# Patient Record
Sex: Male | Born: 1990 | Race: White | Hispanic: No | Marital: Single | Smoking: Current every day smoker
Health system: Southern US, Community
[De-identification: ages and names within clinical notes are randomized; demographics above are authoritative.]

## PROBLEM LIST (undated history)

## (undated) DIAGNOSIS — F1020 Alcohol dependence, uncomplicated: Secondary | ICD-10-CM

---
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
## 2016-01-16 ENCOUNTER — Encounter (HOSPITAL_COMMUNITY): Payer: Self-pay | Admitting: Emergency Medicine

## 2016-01-16 ENCOUNTER — Emergency Department (HOSPITAL_COMMUNITY)
Admission: EM | Admit: 2016-01-16 | Discharge: 2016-01-16 | Disposition: A | Discharge status: Home / Self Care | Payer: Self-pay | Attending: Emergency Medicine | Admitting: Emergency Medicine

## 2016-01-16 ENCOUNTER — Emergency Department (HOSPITAL_COMMUNITY): Payer: Self-pay

## 2016-01-16 DIAGNOSIS — F17210 Nicotine dependence, cigarettes, uncomplicated: Secondary | ICD-10-CM | POA: Insufficient documentation

## 2016-01-16 DIAGNOSIS — Z79899 Other long term (current) drug therapy: Secondary | ICD-10-CM | POA: Insufficient documentation

## 2016-01-16 DIAGNOSIS — R079 Chest pain, unspecified: Secondary | ICD-10-CM | POA: Insufficient documentation

## 2016-01-16 DIAGNOSIS — R05 Cough: Secondary | ICD-10-CM | POA: Insufficient documentation

## 2016-01-16 LAB — BASIC METABOLIC PANEL
Anion gap: 11 (ref 5–15)
BUN: 8 mg/dL (ref 6–20)
CHLORIDE: 105 mmol/L (ref 101–111)
CO2: 26 mmol/L (ref 22–32)
Calcium: 8.8 mg/dL — ABNORMAL LOW (ref 8.9–10.3)
Creatinine, Ser: 0.76 mg/dL (ref 0.61–1.24)
GFR calc Af Amer: >60 mL/min (ref >60)
GFR calc non Af Amer: >60 mL/min (ref >60)
GLUCOSE: 89 mg/dL (ref 65–99)
POTASSIUM: 3.3 mmol/L — AB (ref 3.5–5.1)
Sodium: 142 mmol/L (ref 135–145)

## 2016-01-16 LAB — CBC
HEMATOCRIT: 44.4 % (ref 39.0–52.0)
Hemoglobin: 15.3 g/dL (ref 13.0–17.0)
MCH: 33.4 pg (ref 26.0–34.0)
MCHC: 34.5 g/dL (ref 30.0–36.0)
MCV: 96.9 fL (ref 78.0–100.0)
Platelets: 264 10*3/uL (ref 150–400)
RBC: 4.58 MIL/uL (ref 4.22–5.81)
RDW: 12.9 % (ref 11.5–15.5)
WBC: 7.4 10*3/uL (ref 4.0–10.5)

## 2016-01-16 LAB — TROPONIN I: Troponin I: <0.03 ng/mL (ref <0.031)

## 2016-01-16 MED ORDER — IBUPROFEN 600 MG PO TABS: 600.0000 mg | ORAL_TABLET | Freq: Four times a day (QID) | ORAL | Status: DC | PRN

## 2016-01-16 MED ORDER — IBUPROFEN 800 MG PO TABS
800.0000 mg | ORAL_TABLET | Freq: Once | ORAL | Status: AC
  Administered 2016-01-16: 800 mg via ORAL
  Filled 2016-01-16: qty 1

## 2016-01-16 NOTE — ED Notes (Signed)
PT c/o left sided rib pain x4 days and states recent congestion with cough. PT also states dizziness at times x3 days. PT states productive brown sputum cough.

## 2016-01-16 NOTE — ED Provider Notes (Signed)
CSN: [id number]     Arrival date & time 01/16/16  1114 History   First MD Initiated Contact with Patient 01/16/16 1330     Chief Complaint  Patient presents with  . Chest Pain    Rib Pain     Patient is a 25 y.o. male presenting with chest pain. The history is provided by the patient.  Chest Pain Pain location:  L chest Pain quality comment:  Soreness Pain radiates to:  Does not radiate Pain severity:  Moderate Onset quality:  Gradual Duration:  2 days Timing:  Constant Progression:  Worsening Chronicity:  New Relieved by:  Nothing Worsened by:  Coughing, movement, deep breathing and certain positions Associated symptoms: cough   Associated symptoms: no abdominal pain, no fever, no lower extremity edema, no shortness of breath, no syncope and not vomiting   Associated symptoms comment:  Denies hemoptysis  Risk factors: male sex and smoking   Risk factors: no coronary artery disease, no diabetes mellitus, no immobilization, no prior DVT/PE and no surgery   pt reports left sided rib pain for 2 days It is worse with movement/palpation and deep breathing No trauma but does report heavy lifting at work No h/o CAD/PE/DVT He is a smoker No recent travel     PMH - none Soc hx - smoker Family history - positive for CAD Social History  Substance Use Topics  . Smoking status: Current Every Day Smoker -- 2.00 packs/day    Types: Cigarettes  . Smokeless tobacco: None  . Alcohol Use: Yes     Comment: occasionally    Review of Systems  Constitutional: Negative for fever.  Respiratory: Positive for cough. Negative for shortness of breath.   Cardiovascular: Positive for chest pain. Negative for leg swelling and syncope.  Gastrointestinal: Negative for vomiting and abdominal pain.  Neurological: Negative for syncope.  Psychiatric/Behavioral: The patient is nervous/anxious.   All other systems reviewed and are negative.     Allergies  Review of patient's allergies  indicates no known allergies.  Home Medications   Prior to Admission medications   Medication Sig Start Date End Date Taking? Authorizing Provider  traZODone (DESYREL) 100 MG tablet Take 100 mg by mouth at bedtime as needed for sleep.   Yes Historical Provider, MD  ibuprofen (ADVIL,MOTRIN) 600 MG tablet Take 1 tablet (600 mg total) by mouth every 6 (six) hours as needed. 01/16/16   Zadie Rhineonald Shoji Pertuit, MD   BP 141/71 mmHg  Pulse 119  Temp(Src) 98.9 F (37.2 C) (Oral)  Resp 16  Ht 5\' 7"  (1.702 m)  Wt 72.576 kg  BMI 25.05 kg/m2  SpO2 96% Physical Exam CONSTITUTIONAL: Well developed/well nourished HEAD: Normocephalic/atraumatic EYES: EOMI/PERRL ENMT: Mucous membranes moist NECK: supple no meningeal signs SPINE/BACK:entire spine nontender CV: S1/S2 noted, no murmurs/rubs/gallops noted Chest - diffuse tenderness along left chest wall, no bruising/crepitus.   LUNGS: Lungs are clear to auscultation bilaterally, no apparent distress ABDOMEN: soft, nontender, no rebound or guarding, bowel sounds noted throughout abdomen GU:no cva tenderness NEURO: Pt is awake/alert/appropriate, moves all extremitiesx4.  No facial droop.   EXTREMITIES: pulses normal/equal, full ROM, no LE edema noted SKIN: warm, color normal PSYCH: he is anxious  ED Course  Procedures  Medications  ibuprofen (ADVIL,MOTRIN) tablet 800 mg (800 mg Oral Given 01/16/16 1421)    Labs Review Labs Reviewed  BASIC METABOLIC PANEL - Abnormal; Notable for the following:    Potassium 3.3 (*)    Calcium 8.8 (*)    All other  components within normal limits  CBC  TROPONIN I    Imaging Review Dg Chest 2 View  01/16/2016  CLINICAL DATA:  Chest pain on off for years EXAM: CHEST  2 VIEW COMPARISON:  None. FINDINGS: The heart size and mediastinal contours are within normal limits. Both lungs are clear. The visualized skeletal structures are unremarkable. IMPRESSION: No active cardiopulmonary disease. Electronically Signed   By:  Elige Ko   On: 01/16/2016 12:10   I have personally reviewed and evaluated these images and lab results as part of my medical decision-making.   EKG Interpretation   Date/Time:  Monday January 16 2016 11:37:16 EDT Ventricular Rate:  115 PR Interval:  158 QRS Duration: 84 QT Interval:  318 QTC Calculation: 439 R Axis:   88 Text Interpretation:  Sinus tachycardia Otherwise normal ECG No previous  ECGs available Confirmed by Bebe Shaggy  MD, Dorinda Hill ([id number]) on 01/16/2016  12:57:37 PM     Pt appears very anxious Suspect tachycardia due to anxiety His CP is reproducible with palpation and movement in the bed I doubt ACS/PE/Dissection at this time He appears improved after I discussed normal labs/imaging Discussed return precautions  MDM   Final diagnoses:  Chest pain, unspecified chest pain type    Nursing notes including past medical history and social history reviewed and considered in documentation xrays/imaging reviewed by myself and considered during evaluation Labs/vital reviewed myself and considered during evaluation     Zadie Rhine, MD 01/16/16 1504

## 2016-01-16 NOTE — Discharge Instructions (Signed)

## 2016-09-17 ENCOUNTER — Emergency Department (HOSPITAL_COMMUNITY)
Admission: EM | Admit: 2016-09-17 | Discharge: 2016-09-17 | Disposition: A | Discharge status: Home / Self Care | Payer: Self-pay | Attending: Emergency Medicine | Admitting: Emergency Medicine

## 2016-09-17 ENCOUNTER — Emergency Department (HOSPITAL_COMMUNITY): Payer: Self-pay

## 2016-09-17 ENCOUNTER — Encounter (HOSPITAL_COMMUNITY): Payer: Self-pay | Admitting: Emergency Medicine

## 2016-09-17 DIAGNOSIS — F17210 Nicotine dependence, cigarettes, uncomplicated: Secondary | ICD-10-CM | POA: Insufficient documentation

## 2016-09-17 DIAGNOSIS — J069 Acute upper respiratory infection, unspecified: Secondary | ICD-10-CM | POA: Insufficient documentation

## 2016-09-17 MED ORDER — PREDNISONE 20 MG PO TABS: 40.0000 mg | ORAL_TABLET | Freq: Every day | ORAL | 0 refills | Status: DC

## 2016-09-17 MED ORDER — IPRATROPIUM-ALBUTEROL 0.5-2.5 (3) MG/3ML IN SOLN
3.0000 mL | Freq: Once | RESPIRATORY_TRACT | Status: AC
  Administered 2016-09-17: 3 mL via RESPIRATORY_TRACT
  Filled 2016-09-17: qty 3

## 2016-09-17 MED ORDER — ALBUTEROL SULFATE HFA 108 (90 BASE) MCG/ACT IN AERS
2.0000 | INHALATION_SPRAY | Freq: Once | RESPIRATORY_TRACT | Status: AC
  Administered 2016-09-17: 2 via RESPIRATORY_TRACT
  Filled 2016-09-17: qty 6.7

## 2016-09-17 MED ORDER — BENZONATATE 100 MG PO CAPS: 200.0000 mg | ORAL_CAPSULE | Freq: Three times a day (TID) | ORAL | 0 refills | Status: DC | PRN

## 2016-09-17 MED ORDER — ALBUTEROL SULFATE (2.5 MG/3ML) 0.083% IN NEBU
2.5000 mg | INHALATION_SOLUTION | Freq: Once | RESPIRATORY_TRACT | Status: AC
  Administered 2016-09-17: 2.5 mg via RESPIRATORY_TRACT
  Filled 2016-09-17: qty 3

## 2016-09-17 MED ORDER — PREDNISONE 50 MG PO TABS
60.0000 mg | ORAL_TABLET | Freq: Once | ORAL | Status: AC
  Administered 2016-09-17: 60 mg via ORAL
  Filled 2016-09-17: qty 1

## 2016-09-17 NOTE — ED Provider Notes (Signed)
[facility]-EMERGENCY DEPT Provider Note   CSN: [id number] Arrival date & time: 09/17/16  [id number]     History   Chief Complaint Chief Complaint  Patient presents with  . Generalized Body Aches  . Cough    HPI Riley SermonRobert G Eaker is a 25 y.o. male.  HPI   Riley [name] is a 25 y.o. male who presents to the Emergency Department complaining of cough, wheezing, sore throat and nasal congestion.  Onset of symptoms began 3 days ago.  He describes a non-productive cough with associated chest tightness.  He denies shortness of breath, fever, abd pain, vomiting or known sick contacts.  He has been taking an OTC cold medication without relief.     History reviewed. No pertinent past medical history.  There are no active problems to display for this patient.   History reviewed. No pertinent surgical history.     Home Medications    Prior to Admission medications   Medication Sig Start Date End Date Taking? Authorizing Provider  acetaminophen (TYLENOL) 500 MG tablet Take 1,000 mg by mouth every 6 (six) hours as needed.   Yes Historical Provider, MD  Pseudoeph-Doxylamine-DM-APAP (NYQUIL PO) Take 30 mLs by mouth daily as needed (cold symptoms).   Yes Historical Provider, MD  traZODone (DESYREL) 100 MG tablet Take 100 mg by mouth at bedtime as needed for sleep.   Yes Historical Provider, MD    Family History History reviewed. No pertinent family history.  Social History Social History  Substance Use Topics  . Smoking status: Current Every Day Smoker    Packs/day: 2.00    Types: Cigarettes  . Smokeless tobacco: Never Used  . Alcohol use Yes     Comment: occasionally     Allergies   Patient has no known allergies.   Review of Systems Review of Systems  Constitutional: Negative for activity change, appetite change, chills and fever.  HENT: Positive for congestion, rhinorrhea and sore throat. Negative for facial swelling and trouble swallowing.   Eyes: Negative for visual  disturbance.  Respiratory: Positive for cough, chest tightness and wheezing. Negative for shortness of breath and stridor.   Gastrointestinal: Negative for abdominal pain, nausea and vomiting.  Genitourinary: Negative for dysuria and flank pain.  Musculoskeletal: Negative for neck pain and neck stiffness.  Skin: Negative.   Neurological: Positive for headaches. Negative for dizziness, weakness and numbness.  Hematological: Negative for adenopathy.  Psychiatric/Behavioral: Negative for confusion.  All other systems reviewed and are negative.    Physical Exam Updated Vital Signs BP 150/100 Comment: Repeat  Pulse 113   Temp 98.2 F (36.8 C) (Oral)   Resp 20   Ht 5\' 7"  (1.702 m)   Wt 72.6 kg   SpO2 100%   BMI 25.06 kg/m   Physical Exam  Constitutional: He is oriented to person, place, and time. He appears well-developed and well-nourished. No distress.  HENT:  Head: Normocephalic and atraumatic.  Right Ear: Tympanic membrane and ear canal normal.  Left Ear: Tympanic membrane and ear canal normal.  Nose: Mucosal edema present.  Mouth/Throat: Uvula is midline and mucous membranes are normal. No uvula swelling. Posterior oropharyngeal erythema present. No oropharyngeal exudate, posterior oropharyngeal edema or tonsillar abscesses.  Eyes: EOM are normal. Pupils are equal, round, and reactive to light.  Neck: Normal range of motion, full passive range of motion without pain and phonation normal. Neck supple. No Kernig's sign noted.  Cardiovascular: Normal rate, regular rhythm and normal heart sounds.   No murmur  heard. Pulmonary/Chest: Effort normal. No stridor. No respiratory distress. He has wheezes. He has no rales. He exhibits no tenderness.  Coarse lungs sounds bilaterally with inspiratory wheezing bilaterally.  No distress noted.   Abdominal: Soft. He exhibits no distension. There is no tenderness.  Musculoskeletal: Normal range of motion. He exhibits no edema.    Lymphadenopathy:    He has no cervical adenopathy.  Neurological: He is alert and oriented to person, place, and time. He exhibits normal muscle tone. Coordination normal.  Skin: Skin is warm and dry. No rash noted.  Nursing note and vitals reviewed.    ED Treatments / Results  Labs (all labs ordered are listed, but only abnormal results are displayed) Labs Reviewed - No data to display  EKG  EKG Interpretation None       Radiology Dg Chest 2 View  Result Date: 09/17/2016 CLINICAL DATA:  Chest pain, shortness of breath, and cough for the past 2-3 days. Current smoker of 1 pack per day. EXAM: CHEST  2 VIEW COMPARISON:  Chest x-ray of January 16, 2016 FINDINGS: The lungs are adequately inflated and clear. The heart and pulmonary vascularity are normal. The mediastinum is normal in width. There is no pleural effusion. The bony thorax is unremarkable. IMPRESSION: There is no active cardiopulmonary disease. Electronically Signed   By: David  SwazilandJordan M.D.   On: 09/17/2016 09:34    Procedures Procedures (including critical care time)  Medications Ordered in ED Medications  predniSONE (DELTASONE) tablet 60 mg (not administered)  ipratropium-albuterol (DUONEB) 0.5-2.5 (3) MG/3ML nebulizer solution 3 mL (3 mLs Nebulization Given 09/17/16 1039)  albuterol (PROVENTIL) (2.5 MG/3ML) 0.083% nebulizer solution 2.5 mg (2.5 mg Nebulization Given 09/17/16 1039)  albuterol (PROVENTIL HFA;VENTOLIN HFA) 108 (90 Base) MCG/ACT inhaler 2 puff (2 puffs Inhalation Given 09/17/16 1048)     Initial Impression / Assessment and Plan / ED Course  I have reviewed the triage vital signs and the nursing notes.  Pertinent labs & imaging results that were available during my care of the patient were reviewed by me and considered in my medical decision making (see chart for details).  Clinical Course    Pt non-toxic appearing.  Mucous membranes are moist.    Lung sounds improved on recheck.  Vitals stable,  tachycardia likely result of taking OTC cold medication.  Sx's likely viral.  Albuterol inhaler dispensed for home use.  Rx for prednisone, tessalon perles for cough  Final Clinical Impressions(s) / ED Diagnoses   Final diagnoses:  Upper respiratory tract infection, unspecified type    New Prescriptions New Prescriptions   No medications on file     Pauline Ausammy Joey Lierman, PA-C 09/17/16 1657    Blane OharaJoshua Zavitz, MD 09/18/16 2200

## 2016-09-17 NOTE — Discharge Instructions (Signed)
Ibuprofen every 6 hrs if needed for fever or body aches,. Drink plenty of fluids.  2 puffs of the inhaler 4 times a day as needed.  Return for any worsening symptoms

## 2016-09-17 NOTE — ED Triage Notes (Signed)
Patient complaining of sore throat, chest with deep breathing and cough, headache, and cough x 3 days.

## 2016-10-24 ENCOUNTER — Emergency Department (HOSPITAL_COMMUNITY)
Admission: EM | Admit: 2016-10-24 | Discharge: 2016-10-25 | Disposition: A | Discharge status: Home / Self Care | Payer: Self-pay | Attending: Emergency Medicine | Admitting: Emergency Medicine

## 2016-10-24 ENCOUNTER — Encounter (HOSPITAL_COMMUNITY): Payer: Self-pay | Admitting: Emergency Medicine

## 2016-10-24 DIAGNOSIS — F10120 Alcohol abuse with intoxication, uncomplicated: Secondary | ICD-10-CM | POA: Insufficient documentation

## 2016-10-24 DIAGNOSIS — Z79899 Other long term (current) drug therapy: Secondary | ICD-10-CM | POA: Insufficient documentation

## 2016-10-24 DIAGNOSIS — F17210 Nicotine dependence, cigarettes, uncomplicated: Secondary | ICD-10-CM | POA: Insufficient documentation

## 2016-10-24 DIAGNOSIS — F10920 Alcohol use, unspecified with intoxication, uncomplicated: Secondary | ICD-10-CM

## 2016-10-24 LAB — CBC WITH DIFFERENTIAL/PLATELET
BASOS PCT: 1 %
Basophils Absolute: 0.1 10*3/uL (ref 0.0–0.1)
EOS ABS: 0.3 10*3/uL (ref 0.0–0.7)
EOS PCT: 3 %
HCT: 45.9 % (ref 39.0–52.0)
HEMOGLOBIN: 15.9 g/dL (ref 13.0–17.0)
Lymphocytes Relative: 45 %
Lymphs Abs: 4.2 10*3/uL — ABNORMAL HIGH (ref 0.7–4.0)
MCH: 34 pg (ref 26.0–34.0)
MCHC: 34.6 g/dL (ref 30.0–36.0)
MCV: 98.1 fL (ref 78.0–100.0)
Monocytes Absolute: 0.4 10*3/uL (ref 0.1–1.0)
Monocytes Relative: 4 %
NEUTROS PCT: 47 %
Neutro Abs: 4.5 10*3/uL (ref 1.7–7.7)
PLATELETS: 326 10*3/uL (ref 150–400)
RBC: 4.68 MIL/uL (ref 4.22–5.81)
RDW: 12.6 % (ref 11.5–15.5)
WBC: 9.4 10*3/uL (ref 4.0–10.5)

## 2016-10-24 LAB — SALICYLATE LEVEL

## 2016-10-24 LAB — COMPREHENSIVE METABOLIC PANEL
ALBUMIN: 4.2 g/dL (ref 3.5–5.0)
ALK PHOS: 73 U/L (ref 38–126)
ALT: 32 U/L (ref 17–63)
ANION GAP: 11 (ref 5–15)
AST: 28 U/L (ref 15–41)
BUN: 5 mg/dL — ABNORMAL LOW (ref 6–20)
CALCIUM: 8.6 mg/dL — AB (ref 8.9–10.3)
CHLORIDE: 111 mmol/L (ref 101–111)
CO2: 23 mmol/L (ref 22–32)
Creatinine, Ser: 0.82 mg/dL (ref 0.61–1.24)
GFR calc non Af Amer: >60 mL/min (ref >60)
GLUCOSE: 104 mg/dL — AB (ref 65–99)
Potassium: 3 mmol/L — ABNORMAL LOW (ref 3.5–5.1)
SODIUM: 145 mmol/L (ref 135–145)
Total Bilirubin: 0.4 mg/dL (ref 0.3–1.2)
Total Protein: 7.2 g/dL (ref 6.5–8.1)

## 2016-10-24 LAB — ACETAMINOPHEN LEVEL

## 2016-10-24 LAB — RAPID URINE DRUG SCREEN, HOSP PERFORMED
Amphetamines: NOT DETECTED
BARBITURATES: NOT DETECTED
BENZODIAZEPINES: NOT DETECTED
COCAINE: NOT DETECTED
OPIATES: NOT DETECTED
Tetrahydrocannabinol: NOT DETECTED

## 2016-10-24 LAB — ETHANOL: Alcohol, Ethyl (B): 361 mg/dL (ref <5)

## 2016-10-24 LAB — CBG MONITORING, ED: Glucose-Capillary: 102 mg/dL — ABNORMAL HIGH (ref 65–99)

## 2016-10-24 MED ORDER — HYDROXYZINE HCL 10 MG PO TABS
10.0000 mg | ORAL_TABLET | Freq: Once | ORAL | Status: AC
  Administered 2016-10-24: 10 mg via ORAL
  Filled 2016-10-24: qty 1

## 2016-10-24 MED ORDER — POTASSIUM CHLORIDE CRYS ER 20 MEQ PO TBCR
40.0000 meq | EXTENDED_RELEASE_TABLET | Freq: Once | ORAL | Status: AC
  Administered 2016-10-24: 40 meq via ORAL
  Filled 2016-10-24: qty 2

## 2016-10-24 NOTE — ED Notes (Signed)
EDP and resident notified of ETOH level

## 2016-10-24 NOTE — ED Notes (Signed)
Pt c/o itching all over.  He removed cardiac monitor.  Per Nurse first pt's girlfriend went upstairs to visit someone in ICU.  Waiting for her to return.  EDP notified of pt wanting to leave and at bedside.  Will order medication for itching.

## 2016-10-24 NOTE — ED Triage Notes (Signed)
Was upstairs visiting friend/family member. Came into ED and nurse first noticed pt was altered, eyes rolling back in head. Slurred speech. Pt admits to ETOH. Denies drug use.

## 2016-10-24 NOTE — ED Provider Notes (Signed)
[facility]-EMERGENCY DEPT Provider Note   CSN: [id number] Arrival date & time: 10/24/16  2042     History   Chief Complaint Chief Complaint  Patient presents with  . Altered Mental Status    HPI Riley SermonRobert G [name] is a 26 y.o. male.  The history is provided by the patient. The history is limited by the condition of the patient.    This patient presents today with altered mental status. He was here with another family member when staff here noted that the patient seemed to be slumped over in his chair. When discussing with the family member and the patient they admit that the patient has been drinking liquor and other alcohol today. Patient denies any illicit drug use. States he has only been drinking alcohol. Does not give a clear quantity of alcohol that he drank today. History is limited due to his level of intoxication today. History reviewed. No pertinent past medical history.  There are no active problems to display for this patient.   History reviewed. No pertinent surgical history.     Home Medications    Prior to Admission medications   Medication Sig Start Date End Date Taking? Authorizing Provider  acetaminophen (TYLENOL) 500 MG tablet Take 1,000 mg by mouth every 6 (six) hours as needed.    Historical Provider, MD  benzonatate (TESSALON PERLES) 100 MG capsule Take 2 capsules (200 mg total) by mouth 3 (three) times daily as needed for cough. Swallow whole, do not chew 09/17/16   Tammy Triplett, PA-C  predniSONE (DELTASONE) 20 MG tablet Take 2 tablets (40 mg total) by mouth daily. For 5 days 09/17/16   Tammy Triplett, PA-C  Pseudoeph-Doxylamine-DM-APAP (NYQUIL PO) Take 30 mLs by mouth daily as needed (cold symptoms).    Historical Provider, MD  traZODone (DESYREL) 100 MG tablet Take 100 mg by mouth at bedtime as needed for sleep.    Historical Provider, MD    Family History No family history on file.  Social History Social History  Substance Use Topics  . Smoking status:  Current Every Day Smoker    Packs/day: 2.00    Types: Cigarettes  . Smokeless tobacco: Never Used  . Alcohol use Yes     Comment: occasionally     Allergies   Patient has no known allergies.   Review of Systems Review of Systems  Unable to perform ROS: Mental status change     Physical Exam Updated Vital Signs BP 120/89   Pulse (!) 27   Temp 98.4 F (36.9 C) (Oral)   Resp 25   Ht 5\' 9"  (1.753 m)   Wt 72.6 kg   SpO2 (!) 80%   BMI 23.63 kg/m   Physical Exam  Constitutional: No distress.  HENT:  Head: Normocephalic and atraumatic.  Eyes: Conjunctivae and EOM are normal. Pupils are equal, round, and reactive to light.  Horizontal nystagmus noted on my exam  Neck: Normal range of motion. Neck supple.  Cardiovascular: Normal rate and regular rhythm.   Pulmonary/Chest: Effort normal and breath sounds normal. No respiratory distress.  Abdominal: Soft. He exhibits no distension. There is no tenderness.  Neurological: He is alert.  Skin: Skin is warm and dry. He is not diaphoretic.  Psychiatric: He is agitated.  Nursing note and vitals reviewed.    ED Treatments / Results  Labs (all labs ordered are listed, but only abnormal results are displayed) Labs Reviewed  CBC WITH DIFFERENTIAL/PLATELET - Abnormal; Notable for the following:  Result Value   Lymphs Abs 4.2 (*)    All other components within normal limits  COMPREHENSIVE METABOLIC PANEL - Abnormal; Notable for the following:    Potassium 3.0 (*)    Glucose, Bld 104 (*)    BUN 5 (*)    Calcium 8.6 (*)    All other components within normal limits  ETHANOL - Abnormal; Notable for the following:    Alcohol, Ethyl (B) 361 (*)    All other components within normal limits  ACETAMINOPHEN LEVEL - Abnormal; Notable for the following:    Acetaminophen (Tylenol), Serum <10 (*)    All other components within normal limits  CBG MONITORING, ED - Abnormal; Notable for the following:    Glucose-Capillary 102 (*)     All other components within normal limits  SALICYLATE LEVEL  RAPID URINE DRUG SCREEN, HOSP PERFORMED    EKG  EKG Interpretation  Date/Time:  Wednesday October 24 2016 20:54:44 EST Ventricular Rate:  102 PR Interval:    QRS Duration: 91 QT Interval:  331 QTC Calculation: 432 R Axis:   89 Text Interpretation:  Sinus tachycardia similar to prior EKG  Confirmed by LIU MD, DANA ([id number]) on 10/24/2016 9:01:44 PM       Radiology No results found.  Procedures Procedures (including critical care time)  Medications Ordered in ED Medications  hydrOXYzine (ATARAX/VISTARIL) tablet 10 mg (not administered)  potassium chloride SA (K-DUR,KLOR-CON) CR tablet 40 mEq (40 mEq Oral Given 10/24/16 2242)     Initial Impression / Assessment and Plan / ED Course  I have reviewed the triage vital signs and the nursing notes.  Pertinent labs & imaging results that were available during my care of the patient were reviewed by me and considered in my medical decision making (see chart for details).  Clinical Course     Patient is a 26 year old male who presents today with altered mental status. Patient admits to drinking but denies any other illicit drug use. Tylenol and salicylate levels are negative. His alcohol level is notably 361 today. This is congruent with the patient's observe behaviors here in the emergency department. Otherwise, mild hypokalemia is noted with potassium being 3.0. We'll replete with oral potassium. Urine drug screen is negative.  Plan at this time is to metabolize to freedom. We will continue to observe the patient here in the emergency Department until clinically sober.  Patient's girlfriend shows up later. Patient up walking around in the room with no difficulty. Will discharge patient from ED once she arrives and can clarify she will be with him while he is intoxicated.   Care transferred to oncoming attending while awaiting arrival of girlfriend.    Final Clinical  Impressions(s) / ED Diagnoses   Final diagnoses:  Alcoholic intoxication without complication [facility])    New Prescriptions New Prescriptions   No medications on file     Madolyn Frieze, MD 10/24/16 2248    Madolyn Frieze, MD 10/24/16 2349    Lavera Guise, MD 10/25/16 0002    Lavera Guise, MD 10/25/16 0020

## 2016-10-25 NOTE — ED Notes (Signed)
Pt in another hallway dressed and trying to leave.  States he is going upstairs to look for his sister.  Informed him he would have to wait in ED until someone comes to get him.  NS called unit that his family member is on and asked them to send visitor to ED to get pt.  Dr. Verdie MosherLiu states pt can leave once someone comes to get him.

## 2017-04-22 ENCOUNTER — Emergency Department (HOSPITAL_COMMUNITY)
Admission: EM | Admit: 2017-04-22 | Discharge: 2017-04-23 | Disposition: A | Discharge status: Home / Self Care | Payer: Self-pay | Attending: Emergency Medicine | Admitting: Emergency Medicine

## 2017-04-22 ENCOUNTER — Encounter (HOSPITAL_COMMUNITY): Payer: Self-pay | Admitting: Emergency Medicine

## 2017-04-22 DIAGNOSIS — F17210 Nicotine dependence, cigarettes, uncomplicated: Secondary | ICD-10-CM | POA: Insufficient documentation

## 2017-04-22 DIAGNOSIS — F1010 Alcohol abuse, uncomplicated: Secondary | ICD-10-CM | POA: Insufficient documentation

## 2017-04-22 HISTORY — DX: Alcohol dependence, uncomplicated: F10.20

## 2017-04-22 LAB — RAPID URINE DRUG SCREEN, HOSP PERFORMED
AMPHETAMINES: NOT DETECTED
BENZODIAZEPINES: NOT DETECTED
Barbiturates: NOT DETECTED
COCAINE: NOT DETECTED
OPIATES: NOT DETECTED
TETRAHYDROCANNABINOL: POSITIVE — AB

## 2017-04-22 LAB — COMPREHENSIVE METABOLIC PANEL
ALBUMIN: 4.1 g/dL (ref 3.5–5.0)
ALK PHOS: 110 U/L (ref 38–126)
ALT: 21 U/L (ref 17–63)
AST: 28 U/L (ref 15–41)
Anion gap: 18 — ABNORMAL HIGH (ref 5–15)
BUN: 9 mg/dL (ref 6–20)
CO2: 23 mmol/L (ref 22–32)
Calcium: 8.7 mg/dL — ABNORMAL LOW (ref 8.9–10.3)
Chloride: 100 mmol/L — ABNORMAL LOW (ref 101–111)
Creatinine, Ser: 0.81 mg/dL (ref 0.61–1.24)
GFR calc non Af Amer: >60 mL/min (ref >60)
GLUCOSE: 83 mg/dL (ref 65–99)
Potassium: 3 mmol/L — ABNORMAL LOW (ref 3.5–5.1)
SODIUM: 141 mmol/L (ref 135–145)
TOTAL PROTEIN: 7.9 g/dL (ref 6.5–8.1)
Total Bilirubin: 0.9 mg/dL (ref 0.3–1.2)

## 2017-04-22 LAB — CBC WITH DIFFERENTIAL/PLATELET
Basophils Absolute: 0.1 10*3/uL (ref 0.0–0.1)
Basophils Relative: 1 %
EOS ABS: 0.2 10*3/uL (ref 0.0–0.7)
Eosinophils Relative: 1 %
HCT: 45.8 % (ref 39.0–52.0)
HEMOGLOBIN: 16.1 g/dL (ref 13.0–17.0)
LYMPHS ABS: 2.9 10*3/uL (ref 0.7–4.0)
Lymphocytes Relative: 22 %
MCH: 34.3 pg — AB (ref 26.0–34.0)
MCHC: 35.2 g/dL (ref 30.0–36.0)
MCV: 97.7 fL (ref 78.0–100.0)
Monocytes Absolute: 0.4 10*3/uL (ref 0.1–1.0)
Monocytes Relative: 3 %
NEUTROS PCT: 73 %
Neutro Abs: 9.5 10*3/uL — ABNORMAL HIGH (ref 1.7–7.7)
Platelets: 297 10*3/uL (ref 150–400)
RBC: 4.69 MIL/uL (ref 4.22–5.81)
RDW: 12.5 % (ref 11.5–15.5)
WBC: 13.2 10*3/uL — AB (ref 4.0–10.5)

## 2017-04-22 LAB — ETHANOL: Alcohol, Ethyl (B): 371 mg/dL (ref <5)

## 2017-04-22 MED ORDER — IBUPROFEN 400 MG PO TABS
400.0000 mg | ORAL_TABLET | Freq: Once | ORAL | Status: AC
  Administered 2017-04-22: 400 mg via ORAL
  Filled 2017-04-22: qty 1

## 2017-04-22 MED ORDER — POTASSIUM CHLORIDE CRYS ER 20 MEQ PO TBCR
40.0000 meq | EXTENDED_RELEASE_TABLET | Freq: Once | ORAL | Status: AC
  Administered 2017-04-22: 40 meq via ORAL
  Filled 2017-04-22: qty 2

## 2017-04-22 MED ORDER — SILVER SULFADIAZINE 1 % EX CREA
TOPICAL_CREAM | Freq: Once | CUTANEOUS | Status: AC
  Administered 2017-04-22: 23:00:00 via TOPICAL
  Filled 2017-04-22: qty 50

## 2017-04-22 NOTE — ED Notes (Signed)
CRITICAL VALUE ALERT  Critical Value:  Alcohol 371  Date & Time Notied:  04/22/17  Provider Notified: Dr. Hyacinth MeekerMiller  Orders Received/Actions taken:

## 2017-04-22 NOTE — ED Notes (Signed)
Probation office Riley [name](Jason Gibson) states that he spoke with the pt this am and told him to arrive at his office at 4 pm for known parole warrants today. [phone]. PT has ankle bractlet intact to right ankle d/t registered sex offender.

## 2017-04-22 NOTE — ED Notes (Signed)
edp aware of [facility]WA

## 2017-04-22 NOTE — ED Notes (Signed)
Patient was unable to walk almost past out .was short of breath

## 2017-04-22 NOTE — ED Notes (Signed)
Pt ambulatory to bathroom and back to room assisted by Areta HaberVelinda, NT, sitter- pt continues to report being "hot and dizzy" when ambulating.Dr Clayborne DanaMesner made aware.

## 2017-04-22 NOTE — ED Provider Notes (Signed)
10:56 PM Assumed care from Dr. Hyacinth MeekerMiller, please see their note for full history, physical and decision making until this point. In brief this is a 26 y.o. year old male who presented to the ED tonight with Medical Clearance     Assumed care pending clinical sobriety.   Reevaluated pateint multiple times in my shift and was always arousable but towards the end of the shift he was more arousable, awake, alert, oriented. States he is not suicidal or homicidal. No e/o psychosis. Does want to quit drinking and willing to follow up in community for same. Plan for librium taper to try and help with physical effects. Will ambulate. Will provide wound care. otherwise will be stable for discharge.   On attempt of ambulation, slightly light headed and still not feeling baseline. Suspect continued intoxication. Still alert and oriented. Doubt significant cause for symptoms, likely needs further time to sober up. Will have recheck in AM.    Labs, studies and imaging reviewed by myself and considered in medical decision making if ordered. Imaging interpreted by radiology.  Labs Reviewed  CBC WITH DIFFERENTIAL/PLATELET - Abnormal; Notable for the following:       Result Value   WBC 13.2 (*)    MCH 34.3 (*)    Neutro Abs 9.5 (*)    All other components within normal limits  COMPREHENSIVE METABOLIC PANEL - Abnormal; Notable for the following:    Potassium 3.0 (*)    Chloride 100 (*)    Calcium 8.7 (*)    Anion gap 18 (*)    All other components within normal limits  RAPID URINE DRUG SCREEN, HOSP PERFORMED - Abnormal; Notable for the following:    Tetrahydrocannabinol POSITIVE (*)    All other components within normal limits  ETHANOL - Abnormal; Notable for the following:    Alcohol, Ethyl (B) 371 (*)    All other components within normal limits    No orders to display    No Follow-up on file.    Marily MemosMesner, Rider Ermis, MD 04/22/17 [id number]

## 2017-04-22 NOTE — ED Notes (Signed)
Pt alert and oriented x 4.  Pt able to tell me where he is at, including name of city he is in currently and name of hospital. Pt given water.
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
## 2017-04-22 NOTE — ED Notes (Signed)
Silvadene cream applied to 1st and 2nd digits of left hand and above umbilicus, covered with adhesive dressing.

## 2017-04-22 NOTE — ED Triage Notes (Signed)
Per ems. Pt states he is a severe alcoholic and wants help. Pt blew .32 on ems breathalyzer.  cbg 85.

## 2017-04-23 LAB — ETHANOL
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
## 2017-04-23 NOTE — Progress Notes (Signed)
CSW spoke with [name] Maduroobert at [facility] who states the pt has been accepted by Dr. [name] [name], MD. Call to report [phone]. JJ, RN notified of the pt's acceptance.   Riley [name], MSW, LCSWA CSW Disposition [phone]

## 2017-04-23 NOTE — ED Notes (Signed)
Pt eating breakfast tray 

## 2017-04-23 NOTE — ED Notes (Signed)
Pt given cup of water 

## 2017-04-23 NOTE — ED Notes (Signed)
This nurse questioned pt why he came to ED- pt said he "called EMS because he could not function". This nurse asked pt if he had any thoughts of hurting himself or anyone else. Pt stated, "no". Dr Clayborne DanaMesner made aware and says pt does not need sitter for SI as pt had not reported any thoughts of suicide. Wilkie AyeKristy, RN charge nurse made aware. Olegario MessierKathy, RN, [facility] called to inform of need for [employer] (safety camera).

## 2017-04-23 NOTE — ED Provider Notes (Signed)
Patient signed out to me from Dr. Erin HearingMessner to allow to metabolize ethanol to where he is able to converse. He was sleeping all night. At this point, he is arousable and able to hold a conversation. He denies suicidal or homicidal ideation, denies hallucinations. I suspect that his problem is all ethanol intoxication. Will get TTS consultation to make sure that there is no underlying psychiatric problem that needs to be addressed.   Dione BoozeGlick, Shenelle Klas, MD 04/23/17 [phone]

## 2017-10-14 ENCOUNTER — Encounter (HOSPITAL_COMMUNITY): Payer: Self-pay | Admitting: Emergency Medicine

## 2017-10-14 ENCOUNTER — Other Ambulatory Visit: Payer: Self-pay

## 2017-10-14 ENCOUNTER — Emergency Department (HOSPITAL_COMMUNITY)
Admission: EM | Admit: 2017-10-14 | Discharge: 2017-10-14 | Disposition: A | Discharge status: Home / Self Care | Payer: Self-pay | Attending: Emergency Medicine | Admitting: Emergency Medicine

## 2017-10-14 DIAGNOSIS — F10920 Alcohol use, unspecified with intoxication, uncomplicated: Secondary | ICD-10-CM

## 2017-10-14 DIAGNOSIS — R112 Nausea with vomiting, unspecified: Secondary | ICD-10-CM | POA: Insufficient documentation

## 2017-10-14 DIAGNOSIS — F10120 Alcohol abuse with intoxication, uncomplicated: Secondary | ICD-10-CM | POA: Insufficient documentation

## 2017-10-14 DIAGNOSIS — F17210 Nicotine dependence, cigarettes, uncomplicated: Secondary | ICD-10-CM | POA: Insufficient documentation

## 2017-10-14 MED ORDER — ONDANSETRON 4 MG PO TBDP
4.0000 mg | ORAL_TABLET | Freq: Once | ORAL | Status: AC
  Administered 2017-10-14: 4 mg via ORAL
  Filled 2017-10-14: qty 1

## 2017-10-14 NOTE — ED Provider Notes (Signed)
[facility]-EMERGENCY DEPT Provider Note   CSN: [id number] Arrival date & time: 10/14/17  1939     History   Chief Complaint Chief Complaint  Patient presents with  . Alcohol Intoxication    HPI Riley SermonRobert G Steib is a 26 y.o. male with past medical history of alcoholism, presented to the ED for alcohol intoxication.  Patient states he called EMS because he was vomiting.  Drinks about 1/5 of alcohol per day, with last drink just prior to arrival.  He states he has no intentions of discontinuing alcohol use, however he knows he should.  Denies any other complaints.  The history is provided by the patient.    Past Medical History:  Diagnosis Date  . Alcoholism (HCC)     There are no active problems to display for this patient.   History reviewed. No pertinent surgical history.     Home Medications    Prior to Admission medications   Medication Sig Start Date End Date Taking? Authorizing Provider  acetaminophen (TYLENOL) 500 MG tablet Take 1,000 mg by mouth every 6 (six) hours as needed for mild pain or moderate pain.     [provider]  traZODone (DESYREL) 100 MG tablet Take 100 mg by mouth at bedtime as needed for sleep.    [provider]    Family History History reviewed. No pertinent family history.  Social History Social History   Tobacco Use  . Smoking status: Current Every Day Smoker    Packs/day: 2.00    Types: Cigarettes  . Smokeless tobacco: Never Used  Substance Use Topics  . Alcohol use: Yes    Comment: daily  . Drug use: No     Allergies   Patient has no known allergies.   Review of Systems Review of Systems  Gastrointestinal: Positive for nausea and vomiting.  All other systems reviewed and are negative.    Physical Exam Updated Vital Signs BP (!) 141/97 (BP Location: Right Arm)   Pulse 82   Temp 98.1 F (36.7 C) (Oral)   Resp 18   SpO2 99%   Physical Exam  Constitutional: He appears  well-developed and well-nourished. No distress.  HENT:  Head: Normocephalic and atraumatic.  Eyes: Conjunctivae are normal.  Cardiovascular: Regular rhythm, normal heart sounds and intact distal pulses.  Pulmonary/Chest: Effort normal and breath sounds normal.  Abdominal: Soft. Bowel sounds are normal. There is no tenderness.  Neurological: He is alert.  Patient appears intoxicated, however is alert and oriented.  Skin: Skin is warm.  Psychiatric: He has a normal mood and affect. His behavior is normal.  Nursing note and vitals reviewed.    ED Treatments / Results  Labs (all labs ordered are listed, but only abnormal results are displayed) Labs Reviewed - No data to display  EKG  EKG Interpretation None       Radiology No results found.  Procedures Procedures (including critical care time)  Medications Ordered in ED Medications  ondansetron (ZOFRAN-ODT) disintegrating tablet 4 mg (4 mg Oral Given 10/14/17 2209)     Initial Impression / Assessment and Plan / ED Course  I have reviewed the triage vital signs and the nursing notes.  Pertinent labs & imaging results that were available during my care of the patient were reviewed by me and considered in my medical decision making (see chart for details).     Presenting for nausea and vomiting with alcohol intoxication.  Patient states he drank 1/5 of alcohol prior  to arrival to the ED.  Nausea treated with ODT Zofran with improvement.  Patient tolerating p.o. prior to discharge.  Patient ambulating in ED without assistance.  Patient states he intends to continue drinking when he goes home, however requests outpatient resources.  Patient discharged with outpatient resources.  Not distressed, safe for discharge.  Discussed results, findings, treatment and follow up. Patient advised of return precautions. Patient verbalized understanding and agreed with plan.  Final Clinical Impressions(s) / ED Diagnoses   Final diagnoses:   Alcoholic intoxication without complication [facility](HCC)    ED Discharge Orders    None       Juliana Boling, [place] N, PA-C 10/14/17 2358    Tegeler, Canary Brimhristopher J, MD 10/15/17 [phone]

## 2017-10-14 NOTE — ED Triage Notes (Signed)
Patient is vomiting and nausea. Patient states he has not had any alcohol since yesterday. Patient states he feels bad.

## 2017-10-14 NOTE — Discharge Instructions (Signed)
Please use the resource guide for resources for substance abuse.

## 2017-10-14 NOTE — ED Notes (Signed)
Pt was able to ambulate without assistance to restroom and back.

## 2017-10-14 NOTE — ED Notes (Signed)
Pt stated that he drinks almost everyday and has not had alcohol except for "early" this am. He denies withdrawal symptoms however.

## 2017-10-14 NOTE — ED Notes (Signed)
Pt yelling out in triage wanting a room, pt states he is uncomfortable in triage chair.

## 2017-10-22 ENCOUNTER — Encounter (HOSPITAL_COMMUNITY): Payer: Self-pay | Admitting: Emergency Medicine

## 2017-10-22 ENCOUNTER — Emergency Department (HOSPITAL_COMMUNITY): Payer: Self-pay

## 2017-10-22 ENCOUNTER — Other Ambulatory Visit: Payer: Self-pay

## 2017-10-22 ENCOUNTER — Emergency Department (HOSPITAL_COMMUNITY)
Admission: EM | Admit: 2017-10-22 | Discharge: 2017-10-22 | Disposition: A | Discharge status: Home / Self Care | Payer: Self-pay | Attending: Emergency Medicine | Admitting: Emergency Medicine

## 2017-10-22 DIAGNOSIS — E876 Hypokalemia: Secondary | ICD-10-CM | POA: Insufficient documentation

## 2017-10-22 DIAGNOSIS — Y908 Blood alcohol level of 240 mg/100 ml or more: Secondary | ICD-10-CM | POA: Insufficient documentation

## 2017-10-22 DIAGNOSIS — F10920 Alcohol use, unspecified with intoxication, uncomplicated: Secondary | ICD-10-CM

## 2017-10-22 DIAGNOSIS — F17210 Nicotine dependence, cigarettes, uncomplicated: Secondary | ICD-10-CM | POA: Insufficient documentation

## 2017-10-22 DIAGNOSIS — F10220 Alcohol dependence with intoxication, uncomplicated: Secondary | ICD-10-CM | POA: Insufficient documentation

## 2017-10-22 LAB — CBC WITH DIFFERENTIAL/PLATELET
BASOS ABS: 0.1 10*3/uL (ref 0.0–0.1)
BASOS PCT: 1 %
Eosinophils Absolute: 0.2 10*3/uL (ref 0.0–0.7)
Eosinophils Relative: 2 %
HEMATOCRIT: 48.5 % (ref 39.0–52.0)
Hemoglobin: 16.7 g/dL (ref 13.0–17.0)
Lymphocytes Relative: 52 %
Lymphs Abs: 3.5 10*3/uL (ref 0.7–4.0)
MCH: 32.7 pg (ref 26.0–34.0)
MCHC: 34.4 g/dL (ref 30.0–36.0)
MCV: 95.1 fL (ref 78.0–100.0)
MONO ABS: 0.3 10*3/uL (ref 0.1–1.0)
Monocytes Relative: 4 %
NEUTROS ABS: 2.8 10*3/uL (ref 1.7–7.7)
Neutrophils Relative %: 41 %
Platelets: 231 10*3/uL (ref 150–400)
RBC: 5.1 MIL/uL (ref 4.22–5.81)
RDW: 13.3 % (ref 11.5–15.5)
WBC: 6.7 10*3/uL (ref 4.0–10.5)

## 2017-10-22 LAB — COMPREHENSIVE METABOLIC PANEL
ALK PHOS: 87 U/L (ref 38–126)
ALT: 26 U/L (ref 17–63)
AST: 28 U/L (ref 15–41)
Albumin: 3.7 g/dL (ref 3.5–5.0)
Anion gap: 13 (ref 5–15)
BILIRUBIN TOTAL: 0.4 mg/dL (ref 0.3–1.2)
CALCIUM: 9 mg/dL (ref 8.9–10.3)
CHLORIDE: 112 mmol/L — AB (ref 101–111)
CO2: 21 mmol/L — ABNORMAL LOW (ref 22–32)
CREATININE: 0.7 mg/dL (ref 0.61–1.24)
GFR calc Af Amer: >60 mL/min (ref >60)
Glucose, Bld: 100 mg/dL — ABNORMAL HIGH (ref 65–99)
Potassium: 2.8 mmol/L — ABNORMAL LOW (ref 3.5–5.1)
Sodium: 146 mmol/L — ABNORMAL HIGH (ref 135–145)
Total Protein: 7 g/dL (ref 6.5–8.1)

## 2017-10-22 LAB — ACETAMINOPHEN LEVEL: Acetaminophen (Tylenol), Serum: <10 ug/mL — ABNORMAL LOW (ref 10–30)

## 2017-10-22 LAB — RAPID URINE DRUG SCREEN, HOSP PERFORMED
Amphetamines: NOT DETECTED
BARBITURATES: NOT DETECTED
Benzodiazepines: NOT DETECTED
COCAINE: NOT DETECTED
Opiates: NOT DETECTED
TETRAHYDROCANNABINOL: NOT DETECTED

## 2017-10-22 LAB — URINALYSIS, COMPLETE (UACMP) WITH MICROSCOPIC
Bacteria, UA: NONE SEEN
Bilirubin Urine: NEGATIVE
Glucose, UA: NEGATIVE mg/dL
HGB URINE DIPSTICK: NEGATIVE
Ketones, ur: NEGATIVE mg/dL
Leukocytes, UA: NEGATIVE
Nitrite: NEGATIVE
Protein, ur: NEGATIVE mg/dL
RBC / HPF: NONE SEEN RBC/hpf (ref 0–5)
SPECIFIC GRAVITY, URINE: 1.001 — AB (ref 1.005–1.030)
Squamous Epithelial / LPF: NONE SEEN
WBC, UA: NONE SEEN WBC/hpf (ref 0–5)
pH: 6 (ref 5.0–8.0)

## 2017-10-22 LAB — CBG MONITORING, ED: Glucose-Capillary: 96 mg/dL (ref 65–99)

## 2017-10-22 LAB — ETHANOL: Alcohol, Ethyl (B): 524 mg/dL (ref <10)

## 2017-10-22 LAB — SALICYLATE LEVEL

## 2017-10-22 MED ORDER — SODIUM CHLORIDE 0.9 % IV SOLN
INTRAVENOUS | Status: DC
  Administered 2017-10-22: 06:00:00 via INTRAVENOUS

## 2017-10-22 MED ORDER — SODIUM CHLORIDE 0.9 % IV BOLUS (SEPSIS)
1000.0000 mL | Freq: Once | INTRAVENOUS | Status: AC
  Administered 2017-10-22: 1000 mL via INTRAVENOUS

## 2017-10-22 MED ORDER — POTASSIUM CHLORIDE 10 MEQ/100ML IV SOLN
10.0000 meq | INTRAVENOUS | Status: DC
  Administered 2017-10-22: 10 meq via INTRAVENOUS
  Filled 2017-10-22: qty 100

## 2017-10-22 MED ORDER — ONDANSETRON HCL 4 MG/2ML IJ SOLN
INTRAMUSCULAR | Status: AC
  Filled 2017-10-22: qty 2

## 2017-10-22 MED ORDER — ONDANSETRON HCL 4 MG/2ML IJ SOLN
4.0000 mg | Freq: Once | INTRAMUSCULAR | Status: AC
  Administered 2017-10-22: 4 mg via INTRAVENOUS

## 2017-10-22 MED ORDER — POTASSIUM CHLORIDE CRYS ER 20 MEQ PO TBCR
40.0000 meq | EXTENDED_RELEASE_TABLET | Freq: Once | ORAL | Status: AC
  Administered 2017-10-22: 40 meq via ORAL
  Filled 2017-10-22: qty 2

## 2017-10-22 NOTE — ED Provider Notes (Addendum)
Pt walking around in his room unassisted. Asking to use phone and how to dial out. Clinically I think he is appropriate for discharge. Took PO potassium.  Removed IV himself.      Raeford RazorKohut, Justyne Roell, MD 10/22/17 [phone]

## 2017-10-22 NOTE — ED Notes (Signed)
Patient pulled out second IV. Ambulating in room.

## 2017-10-22 NOTE — ED Notes (Signed)
Patient provided with blue paper scrub top. Patient using phone to call for transportation at discharge.

## 2017-10-22 NOTE — ED Triage Notes (Signed)
Patient is from home, lives with his grandmother.  Patient has had nausea, vomiting and diarrhea for one week.  Patient admits to drinking this evening.  He states that he has been drinking a large amount.  Patient responds to ammonia inhalant.

## 2017-10-22 NOTE — ED Notes (Signed)
Riley HansenRita Harrold - grandmother [phone]

## 2017-10-22 NOTE — ED Provider Notes (Signed)
TIME SEEN: 4:06 AM  CHIEF COMPLAINT: Altered mental status  HPI: Patient is a 27 year old male with history of alcohol abuse who presents to the emergency department with altered mental status.  Patient comes in with EMS.  They report the grandmother called out as she found him unresponsive.  She was concerned that he began drinking alcohol again tonight.  She reports his last drink was 2 days ago.  Also told EMS that he had nausea, vomiting and diarrhea for the past week.  Blood glucose with EMS was 95.  No known other drug ingestion.  Blood pressure normal with EMS.  Patient initially not responsive to voice or painful stimuli.  Patient is improving.  Received 500 mg IV fluids with EMS.  ROS: Level 5 caveat for altered mental status  PAST MEDICAL HISTORY/PAST SURGICAL HISTORY:  Past Medical History:  Diagnosis Date  . Alcoholism (HCC)     MEDICATIONS:  Prior to Admission medications   Medication Sig Start Date End Date Taking? Authorizing Provider  acetaminophen (TYLENOL) 500 MG tablet Take 1,000 mg by mouth every 6 (six) hours as needed for mild pain or moderate pain.     [provider]  traZODone (DESYREL) 100 MG tablet Take 100 mg by mouth at bedtime as needed for sleep.    [provider]    ALLERGIES:  No Known Allergies  SOCIAL HISTORY:  Social History   Tobacco Use  . Smoking status: Current Every Day Smoker    Packs/day: 2.00    Types: Cigarettes  . Smokeless tobacco: Never Used  Substance Use Topics  . Alcohol use: Yes    Comment: daily    FAMILY HISTORY: No family history on file.  EXAM: BP 103/87 (BP Location: Right Arm)   Pulse (!) 136   Temp (!) 96.5 F (35.8 C) (Temporal)   Resp (!) 25   SpO2 100%  CONSTITUTIONAL: Patient is very somnolent but does open his eyes and talk with painful stimuli, he does move all 4 extremities spontaneously, GCS 11 HEAD: Normocephalic; atraumatic EYES: Conjunctivae clear, PERRL, pupils are approximately  5 mm bilaterally, EOMI ENT: normal nose; no rhinorrhea; moist mucous membranes; pharynx without lesions noted; no dental injury; no septal hematoma NECK: Supple, no meningismus, no LAD; no midline spinal tenderness, step-off or deformity; trachea midline CARD: Regular and tachycardic; S1 and S2 appreciated; no murmurs, no clicks, no rubs, no gallops RESP: Normal chest excursion without splinting or tachypnea; breath sounds clear and equal bilaterally; no wheezes, no rhonchi, no rales; no hypoxia or respiratory distress CHEST:  chest wall stable, no crepitus or ecchymosis or deformity, nontender to palpation; no flail chest ABD/GI: Normal bowel sounds; non-distended; soft, non-tender, no rebound, no guarding; no ecchymosis or other lesions noted PELVIS:  stable, nontender to palpation BACK:  The back appears normal and is non-tender to palpation, there is no CVA tenderness; no midline spinal tenderness, step-off or deformity EXT: Normal ROM in all joints; non-tender to palpation; no edema; normal capillary refill; no cyanosis, no bony tenderness or bony deformity of patient's extremities, no joint effusion, compartments are soft, extremities are warm and well-perfused, no ecchymosis SKIN: Normal color for age and race; warm NEURO: Moves all extremities equally, opens eyes and has slurred speech with painful stimuli.  MEDICAL DECISION MAKING: Patient here with altered mental status.  He states that he "drank a lot" today.  Unable to tell me if there was any drug use.  He is tachycardic here.  He is protecting  his airway.  Blood glucose normal.  Will give IV fluids.  Will monitor him until he is sober.  Will obtain labs, urine, EKG.  ED PROGRESS: Patient now more awake and alert and does open his eyes, answer questions and follow commands to voice.  He reports he drank a gallon of 100 proof alcohol last night.  His alcohol level is 524.  Drug screen is negative.  Tylenol and salicylate level negative.   Potassium is low at 2.8 and he is mildly hypernatremic.  We will continue IV hydration and give oral and IV potassium.  He will need to be monitored until clinically sober.   I reviewed all nursing notes, vitals, pertinent previous records, EKGs, lab and urine results, imaging (as available).       EKG Interpretation  Date/Time:  Tuesday October 22 2017 04:00:27 EST Ventricular Rate:  129 PR Interval:    QRS Duration: 87 QT Interval:  302 QTC Calculation: 443 R Axis:   99 Text Interpretation:  Sinus tachycardia Borderline right axis deviation Borderline T wave abnormalities No significant change since last tracing Confirmed by Tinaya Ceballos, Baxter Hire [phone]) on 10/22/2017 4:16:27 AM         Tej Murdaugh, Layla Maw, DO 10/22/17 6045

## 2017-10-23 ENCOUNTER — Emergency Department (HOSPITAL_COMMUNITY)
Admission: EM | Admit: 2017-10-23 | Discharge: 2017-10-23 | Disposition: A | Discharge status: Home / Self Care | Payer: Self-pay | Attending: Emergency Medicine | Admitting: Emergency Medicine

## 2017-10-23 ENCOUNTER — Other Ambulatory Visit: Payer: Self-pay

## 2017-10-23 ENCOUNTER — Encounter (HOSPITAL_COMMUNITY): Payer: Self-pay

## 2017-10-23 DIAGNOSIS — F10920 Alcohol use, unspecified with intoxication, uncomplicated: Secondary | ICD-10-CM | POA: Insufficient documentation

## 2017-10-23 DIAGNOSIS — E876 Hypokalemia: Secondary | ICD-10-CM | POA: Insufficient documentation

## 2017-10-23 DIAGNOSIS — F17210 Nicotine dependence, cigarettes, uncomplicated: Secondary | ICD-10-CM | POA: Insufficient documentation

## 2017-10-23 LAB — COMPREHENSIVE METABOLIC PANEL
ALBUMIN: 3.4 g/dL — AB (ref 3.5–5.0)
ALK PHOS: 74 U/L (ref 38–126)
ALT: 34 U/L (ref 17–63)
ANION GAP: 9 (ref 5–15)
AST: 49 U/L — AB (ref 15–41)
CO2: 25 mmol/L (ref 22–32)
Calcium: 8.5 mg/dL — ABNORMAL LOW (ref 8.9–10.3)
Chloride: 108 mmol/L (ref 101–111)
Creatinine, Ser: 0.61 mg/dL (ref 0.61–1.24)
GFR calc Af Amer: >60 mL/min (ref >60)
GFR calc non Af Amer: >60 mL/min (ref >60)
GLUCOSE: 104 mg/dL — AB (ref 65–99)
POTASSIUM: 2.8 mmol/L — AB (ref 3.5–5.1)
Sodium: 142 mmol/L (ref 135–145)
Total Bilirubin: 0.4 mg/dL (ref 0.3–1.2)
Total Protein: 6.5 g/dL (ref 6.5–8.1)

## 2017-10-23 LAB — CBC
HCT: 44 % (ref 39.0–52.0)
HEMOGLOBIN: 14.7 g/dL (ref 13.0–17.0)
MCH: 30.9 pg (ref 26.0–34.0)
MCHC: 33.4 g/dL (ref 30.0–36.0)
MCV: 92.4 fL (ref 78.0–100.0)
Platelets: 199 10*3/uL (ref 150–400)
RBC: 4.76 MIL/uL (ref 4.22–5.81)
RDW: 13 % (ref 11.5–15.5)
WBC: 6.4 10*3/uL (ref 4.0–10.5)

## 2017-10-23 MED ORDER — POTASSIUM CHLORIDE CRYS ER 20 MEQ PO TBCR
60.0000 meq | EXTENDED_RELEASE_TABLET | Freq: Once | ORAL | Status: AC
  Administered 2017-10-23: 60 meq via ORAL
  Filled 2017-10-23: qty 3

## 2017-10-23 NOTE — ED Notes (Signed)
Patient was given a [place] Sandwich Bag w/ a Ginger Ale.

## 2017-10-23 NOTE — ED Triage Notes (Signed)
[facility]. Here yesterday for same and reporting has drank liquor since yesterday. Alert and oriented, here yesterday for same

## 2017-10-23 NOTE — Discharge Instructions (Signed)
Please see the attached resource guide for help with quitting drinking.   Stay hydrated. Drink a few gatorade or electrolyte waters in the next few days.   Your potassium was low today. It is important that you follow up with a doctor to get this rechecked.   Return to ER for new or worsening symptoms, any additional concerns.

## 2017-10-23 NOTE — ED Provider Notes (Signed)
[facility] EM[facility] Provider Note   CSN: [id number] Arrival date & time: 10/23/17  1111     History   Chief Complaint No chief complaint on file.   HPI Robby SermonRobert G Barringer is a 27 y.o. male.  The history is provided by the patient and medical records. No language interpreter was used.   Robby SermonRobert G Flatt is a 27 y.o. male  with a PMH of ETOH abuse who presents to the Emergency Department complaining of "alcohol, too much of it". When asked why he came to the ER he states "I drank too much and once I leave here, I'm gonna do it again."  Asked if anything is bothering him, he says "I'm starving".   He denies abdominal pain, nausea, vomiting, chest pain, trouble breathing or any additional complaints.  He states that he went into a 90-day treatment center in the past which was helpful, but started drinking again shortly after leaving.  He states that " all I know is drink, drink, drink.  I do not know how to live my life without it."  Denies any illicit drug use.  Denies suicidal or homicidal ideations.  Denies auditory or visual hallucinations.  Past Medical History:  Diagnosis Date  . Alcoholism (HCC)     There are no active problems to display for this patient.   History reviewed. No pertinent surgical history.     Home Medications    Prior to Admission medications   Medication Sig Start Date End Date Taking? Authorizing Provider  traZODone (DESYREL) 100 MG tablet Take 100 mg by mouth at bedtime as needed for sleep.    [provider]    Family History No family history on file.  Social History Social History   Tobacco Use  . Smoking status: Current Every Day Smoker    Packs/day: 2.00    Types: Cigarettes  . Smokeless tobacco: Never Used  Substance Use Topics  . Alcohol use: Yes    Comment: daily  . Drug use: No     Allergies   Patient has no known allergies.   Review of Systems Review of Systems  All other systems  reviewed and are negative.    Physical Exam Updated Vital Signs BP (!) 146/99 (BP Location: Right Arm)   Pulse 96   Temp 97.7 F (36.5 C) (Oral)   Resp 20   SpO2 98%   Physical Exam  Constitutional: He is oriented to person, place, and time. He appears well-developed and well-nourished. No distress.  Smells of alcohol.   HENT:  Head: Normocephalic and atraumatic.  Cardiovascular: Normal rate, regular rhythm and normal heart sounds.  No murmur heard. Pulmonary/Chest: Effort normal and breath sounds normal. No respiratory distress.  Abdominal: Soft. He exhibits no distension. There is no tenderness.  Musculoskeletal: Normal range of motion.  Neurological: He is alert and oriented to person, place, and time.  Skin: Skin is warm and dry.  Nursing note and vitals reviewed.    ED Treatments / Results  Labs (all labs ordered are listed, but only abnormal results are displayed) Labs Reviewed  COMPREHENSIVE METABOLIC PANEL - Abnormal; Notable for the following components:      Result Value   Potassium 2.8 (*)    Glucose, Bld 104 (*)    BUN <5 (*)    Calcium 8.5 (*)    Albumin 3.4 (*)    AST 49 (*)    All other components within normal limits  CBC  ETHANOL    EKG  EKG Interpretation None       Radiology Dg Chest Portable 1 View  Result Date: 10/22/2017 CLINICAL DATA:  27 y/o  M; cough, intoxicated, rule out aspiration. EXAM: PORTABLE CHEST 1 VIEW COMPARISON:  08/28/2016 chest radiograph FINDINGS: Stable heart size and mediastinal contours are within normal limits. Both lungs are clear. The visualized skeletal structures are unremarkable. IMPRESSION: Normal chest radiograph. Electronically Signed   By: Mitzi Hansen M.D.   On: 10/22/2017 04:47    Procedures Procedures (including critical care time)  Medications Ordered in ED Medications  potassium chloride SA (K-DUR,KLOR-CON) CR tablet 60 mEq (not administered)     Initial Impression / Assessment and  Plan / ED Course  I have reviewed the triage vital signs and the nursing notes.  Pertinent labs & imaging results that were available during my care of the patient were reviewed by me and considered in my medical decision making (see chart for details).    GERAL COKER is a 27 y.o. male who presents to ED for alcohol intoxication. Upon my evaluation, patient denies any complaints other than being hungry. He has eaten a [place] sandwich and drank several ginger ale and water in ED today. Labs reviewed. Hypokalemia which was replenished in ED. On re-evaluation, patient states that he is ready to leave and requesting discharge to home. He states that he does not want to quit drinking, but I did provide substance abuse resources with discharge summary. Encouraged elyte sports drinks or elyte water and PCP follow up for recheck of K+. Discussed reasons to return to ER. He will catch a cab back to his home and family is aware of his arrival.   Final Clinical Impressions(s) / ED Diagnoses   Final diagnoses:  Alcoholic intoxication without complication [facility])  Hypokalemia    ED Discharge Orders    None       Jodey Burbano, Chase Picket, PA-C 10/23/17 1331    Linwood Dibbles, MD 10/23/17 1656

## 2017-10-24 ENCOUNTER — Encounter (HOSPITAL_COMMUNITY): Payer: Self-pay | Admitting: *Deleted

## 2017-10-24 ENCOUNTER — Emergency Department (HOSPITAL_COMMUNITY)
Admission: EM | Admit: 2017-10-24 | Discharge: 2017-10-24 | Disposition: A | Discharge status: Home / Self Care | Payer: Self-pay | Attending: Emergency Medicine | Admitting: Emergency Medicine

## 2017-10-24 ENCOUNTER — Other Ambulatory Visit: Payer: Self-pay

## 2017-10-24 DIAGNOSIS — F17210 Nicotine dependence, cigarettes, uncomplicated: Secondary | ICD-10-CM | POA: Insufficient documentation

## 2017-10-24 DIAGNOSIS — R45851 Suicidal ideations: Secondary | ICD-10-CM | POA: Insufficient documentation

## 2017-10-24 DIAGNOSIS — F10129 Alcohol abuse with intoxication, unspecified: Secondary | ICD-10-CM | POA: Insufficient documentation

## 2017-10-24 DIAGNOSIS — Y908 Blood alcohol level of 240 mg/100 ml or more: Secondary | ICD-10-CM | POA: Insufficient documentation

## 2017-10-24 DIAGNOSIS — F1010 Alcohol abuse, uncomplicated: Secondary | ICD-10-CM

## 2017-10-24 DIAGNOSIS — E876 Hypokalemia: Secondary | ICD-10-CM | POA: Insufficient documentation

## 2017-10-24 LAB — CBC WITH DIFFERENTIAL/PLATELET
BASOS ABS: 0.1 10*3/uL (ref 0.0–0.1)
BASOS PCT: 1 %
EOS ABS: 0.2 10*3/uL (ref 0.0–0.7)
Eosinophils Relative: 3 %
HEMATOCRIT: 44.6 % (ref 39.0–52.0)
Hemoglobin: 15.7 g/dL (ref 13.0–17.0)
Lymphocytes Relative: 45 %
Lymphs Abs: 3.3 10*3/uL (ref 0.7–4.0)
MCH: 33 pg (ref 26.0–34.0)
MCHC: 35.2 g/dL (ref 30.0–36.0)
MCV: 93.7 fL (ref 78.0–100.0)
MONOS PCT: 5 %
Monocytes Absolute: 0.4 10*3/uL (ref 0.1–1.0)
NEUTROS ABS: 3.3 10*3/uL (ref 1.7–7.7)
NEUTROS PCT: 46 %
Platelets: 235 10*3/uL (ref 150–400)
RBC: 4.76 MIL/uL (ref 4.22–5.81)
RDW: 13.4 % (ref 11.5–15.5)
WBC: 7.2 10*3/uL (ref 4.0–10.5)

## 2017-10-24 LAB — COMPREHENSIVE METABOLIC PANEL WITH GFR
ALT: 42 U/L (ref 17–63)
AST: 58 U/L — ABNORMAL HIGH (ref 15–41)
Albumin: 3.8 g/dL (ref 3.5–5.0)
Alkaline Phosphatase: 87 U/L (ref 38–126)
Anion gap: 11 (ref 5–15)
BUN: 5 mg/dL — ABNORMAL LOW (ref 6–20)
CO2: 23 mmol/L (ref 22–32)
Calcium: 8.4 mg/dL — ABNORMAL LOW (ref 8.9–10.3)
Chloride: 107 mmol/L (ref 101–111)
Creatinine, Ser: 0.6 mg/dL — ABNORMAL LOW (ref 0.61–1.24)
GFR calc Af Amer: >60 mL/min (ref >60)
GFR calc non Af Amer: >60 mL/min (ref >60)
Glucose, Bld: 158 mg/dL — ABNORMAL HIGH (ref 65–99)
Potassium: 3.1 mmol/L — ABNORMAL LOW (ref 3.5–5.1)
Sodium: 141 mmol/L (ref 135–145)
Total Bilirubin: 0.4 mg/dL (ref 0.3–1.2)
Total Protein: 7.2 g/dL (ref 6.5–8.1)

## 2017-10-24 LAB — RAPID URINE DRUG SCREEN, HOSP PERFORMED
Amphetamines: NOT DETECTED
Barbiturates: NOT DETECTED
Benzodiazepines: NOT DETECTED
Cocaine: NOT DETECTED
Opiates: NOT DETECTED
Tetrahydrocannabinol: NOT DETECTED

## 2017-10-24 LAB — ETHANOL: Alcohol, Ethyl (B): 394 mg/dL (ref <10)

## 2017-10-24 MED ORDER — POTASSIUM CHLORIDE CRYS ER 20 MEQ PO TBCR
40.0000 meq | EXTENDED_RELEASE_TABLET | Freq: Once | ORAL | Status: AC
  Administered 2017-10-24: 40 meq via ORAL
  Filled 2017-10-24: qty 2

## 2017-10-24 NOTE — ED Provider Notes (Signed)
[facility]-EMERGENCY DEPT Provider Note   CSN: [id number] Arrival date & time: 10/24/17  0558     History   Chief Complaint Chief Complaint  Patient presents with  . Detox  . Depression    HPI Riley SermonRobert G [name] is a 27 y.o. male.  HPI Riley [name] is a 27 y.o. male with history of alcohol abuse, presents to emergency department complaining of intoxication and depression.  Patient was seen in emergency department yesterday for the same.  He states he drinks at least 1/5 of liquor a day.  He states he is unable to stop drinking.  He states that he is here he would like some help with his alcohol problem and because he is now having thoughts of harming himself.  He states that he would rather end it all than continue with this life.  He reports being in the program in the past but states he has not helped.  He does not take any medications.  He states he is currently homeless.  Denies any homicidal ideations.  Denies any medical complaints.  His last drink was just a few minutes ago.  Past Medical History:  Diagnosis Date  . Alcoholism (HCC)     There are no active problems to display for this patient.   History reviewed. No pertinent surgical history.     Home Medications    Prior to Admission medications   Medication Sig Start Date End Date Taking? Authorizing Provider  DM-Doxylamine-Acetaminophen (NYQUIL COLD & FLU PO) Take 30 mLs by mouth at bedtime as needed (sleep and cough).   Yes [provider]  traZODone (DESYREL) 100 MG tablet Take 100 mg by mouth at bedtime as needed for sleep.   Yes [provider]    Family History No family history on file.  Social History Social History   Tobacco Use  . Smoking status: Current Every Day Smoker    Packs/day: 2.00    Types: Cigarettes  . Smokeless tobacco: Never Used  Substance Use Topics  . Alcohol use: Yes    Comment: Pt stated "I drink  1/2 gal of 100 proof every day"  . Drug  use: No     Allergies   Patient has no known allergies.   Review of Systems Review of Systems  Constitutional: Negative for chills and fever.  Respiratory: Negative for cough, chest tightness and shortness of breath.   Cardiovascular: Negative for chest pain, palpitations and leg swelling.  Gastrointestinal: Negative for abdominal distention, abdominal pain, diarrhea, nausea and vomiting.  Genitourinary: Negative for dysuria, frequency, hematuria and urgency.  Musculoskeletal: Negative for arthralgias, myalgias, neck pain and neck stiffness.  Skin: Negative for rash.  Allergic/Immunologic: Negative for immunocompromised state.  Neurological: Negative for dizziness, weakness, light-headedness, numbness and headaches.  Psychiatric/Behavioral: Positive for dysphoric mood and suicidal ideas. The patient is nervous/anxious.      Physical Exam Updated Vital Signs BP (!) 141/90 (BP Location: Left Arm)   Pulse (!) 109   Temp 98.5 F (36.9 C) (Oral)   Resp 14   Ht 5\' 7"  (1.702 m)   Wt 78.5 kg (173 lb)   SpO2 96%   BMI 27.10 kg/m   Physical Exam  Constitutional: He appears well-developed and well-nourished.  Appears to be intoxicated  HENT:  Head: Normocephalic and atraumatic.  Eyes: Conjunctivae are normal.  Neck: Neck supple.  Cardiovascular: Regular rhythm and normal heart sounds.  Tachycardic  Pulmonary/Chest: Effort normal. No respiratory distress. He has  no wheezes. He has no rales.  Abdominal: Soft. Bowel sounds are normal. He exhibits no distension. There is no tenderness. There is no rebound.  Musculoskeletal: He exhibits no edema.  Neurological: He is alert.  Skin: Skin is warm and dry.  Psychiatric:  Expresses suicidal thoughts  Nursing note and vitals reviewed.    ED Treatments / Results  Labs (all labs ordered are listed, but only abnormal results are displayed) Labs Reviewed  CBC WITH DIFFERENTIAL/PLATELET  COMPREHENSIVE METABOLIC PANEL  ETHANOL    RAPID URINE DRUG SCREEN, HOSP PERFORMED    EKG  EKG Interpretation None       Radiology No results found.  Procedures Procedures (including critical care time)  Medications Ordered in ED Medications - No data to display   Initial Impression / Assessment and Plan / ED Course  I have reviewed the triage vital signs and the nursing notes.  Pertinent labs & imaging results that were available during my care of the patient were reviewed by me and considered in my medical decision making (see chart for details).     Patient with alcohol intoxication, requesting help.  Also expresses suicidal thoughts, no plan.  Will get labs, TTS consult.  9:06 AM Patient's alcohol is 394.  Potassium 3.1, 40 mEq ordered.  I got a call from the nurse stating the patient is ready to go home.  I went and reassessed him.  Patient states that he needs to go get a drink before he starts withdrawing.  He appears to be clinically sober, he is able to keep a conversation, he is able to walk up and down the hallway with no difficulty.  He denies any suicidal or homicidal ideations at this time.  He states that he does not want to get help at this time.  I asked him why he came to the ER in the first place and he stated because he thought he drink too much and could not function.  Will discharge home with resources for follow-up.  Return precautions discussed.  Vitals:   10/24/17 0606 10/24/17 0623 10/24/17 0629  BP:  (!) 141/90   Pulse:  (!) 109   Resp:  14   Temp:  98.5 F (36.9 C)   TempSrc:  Oral   SpO2: 98% 96%   Weight:   78.5 kg (173 lb)  Height:   5\' 7"  (1.702 m)    Final Clinical Impressions(s) / ED Diagnoses   Final diagnoses:  Alcohol abuse  Hypokalemia    ED Discharge Orders    None       Jaynie Crumble, PA-C 10/24/17 1630    Shaune Pollack, MD 10/24/17 [phone]

## 2017-10-24 NOTE — ED Notes (Signed)
Pt denies suicidal ideations at this time.

## 2017-10-24 NOTE — ED Notes (Signed)
EDPA aware of critical alcohol level of 394 md/dL
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
## 2017-10-24 NOTE — Discharge Instructions (Signed)
Decrease/stop alcohol intake. Please follow up with a facility.

## 2017-10-24 NOTE — ED Triage Notes (Signed)
Per [facility], pt living with grandmother, requesting psych eval for depression and help to stop drinking.  Per grandmother, they had bed bugs x 2 months ago, they exterminated themselves and haven't seen any occurrence of them since.  Also, no bed bugs noted by [facility].

## 2017-10-25 ENCOUNTER — Other Ambulatory Visit: Payer: Self-pay

## 2017-10-25 ENCOUNTER — Ambulatory Visit (HOSPITAL_COMMUNITY)
Admission: RE | Admit: 2017-10-25 | Discharge: 2017-10-25 | Disposition: A | Discharge status: Home / Self Care | Payer: Self-pay | Attending: Psychiatry | Admitting: Psychiatry

## 2017-10-25 ENCOUNTER — Encounter (HOSPITAL_COMMUNITY): Payer: Self-pay | Admitting: *Deleted

## 2017-10-25 ENCOUNTER — Emergency Department (HOSPITAL_COMMUNITY)
Admission: EM | Admit: 2017-10-25 | Discharge: 2017-10-25 | Disposition: A | Discharge status: Home / Self Care | Payer: Self-pay | Attending: Emergency Medicine | Admitting: Emergency Medicine

## 2017-10-25 DIAGNOSIS — Z5321 Procedure and treatment not carried out due to patient leaving prior to being seen by health care provider: Secondary | ICD-10-CM | POA: Insufficient documentation

## 2017-10-25 DIAGNOSIS — R45851 Suicidal ideations: Secondary | ICD-10-CM | POA: Insufficient documentation

## 2017-10-25 DIAGNOSIS — R079 Chest pain, unspecified: Secondary | ICD-10-CM | POA: Insufficient documentation

## 2017-10-25 DIAGNOSIS — F1010 Alcohol abuse, uncomplicated: Secondary | ICD-10-CM | POA: Insufficient documentation

## 2017-10-25 DIAGNOSIS — F17210 Nicotine dependence, cigarettes, uncomplicated: Secondary | ICD-10-CM | POA: Insufficient documentation

## 2017-10-25 LAB — ETHANOL: Alcohol, Ethyl (B): 405 mg/dL (ref <10)

## 2017-10-25 LAB — CBC
HEMATOCRIT: 46.9 % (ref 39.0–52.0)
Hemoglobin: 16.2 g/dL (ref 13.0–17.0)
MCH: 32.4 pg (ref 26.0–34.0)
MCHC: 34.5 g/dL (ref 30.0–36.0)
MCV: 93.8 fL (ref 78.0–100.0)
Platelets: 245 10*3/uL (ref 150–400)
RBC: 5 MIL/uL (ref 4.22–5.81)
RDW: 13.4 % (ref 11.5–15.5)
WBC: 6.2 10*3/uL (ref 4.0–10.5)

## 2017-10-25 LAB — RAPID URINE DRUG SCREEN, HOSP PERFORMED
Amphetamines: NOT DETECTED
BARBITURATES: NOT DETECTED
Benzodiazepines: NOT DETECTED
COCAINE: NOT DETECTED
OPIATES: NOT DETECTED
Tetrahydrocannabinol: NOT DETECTED

## 2017-10-25 LAB — COMPREHENSIVE METABOLIC PANEL
ALK PHOS: 90 U/L (ref 38–126)
ALT: 47 U/L (ref 17–63)
ANION GAP: 14 (ref 5–15)
AST: 55 U/L — ABNORMAL HIGH (ref 15–41)
Albumin: 3.8 g/dL (ref 3.5–5.0)
BILIRUBIN TOTAL: 0.1 mg/dL — AB (ref 0.3–1.2)
BUN: <5 mg/dL — ABNORMAL LOW (ref 6–20)
CALCIUM: 8.4 mg/dL — AB (ref 8.9–10.3)
CO2: 23 mmol/L (ref 22–32)
CREATININE: 0.64 mg/dL (ref 0.61–1.24)
Chloride: 105 mmol/L (ref 101–111)
GFR calc non Af Amer: >60 mL/min (ref >60)
Glucose, Bld: 109 mg/dL — ABNORMAL HIGH (ref 65–99)
Potassium: 3.3 mmol/L — ABNORMAL LOW (ref 3.5–5.1)
Sodium: 142 mmol/L (ref 135–145)
Total Protein: 7.2 g/dL (ref 6.5–8.1)

## 2017-10-25 LAB — TROPONIN I

## 2017-10-25 LAB — SALICYLATE LEVEL

## 2017-10-25 LAB — ACETAMINOPHEN LEVEL

## 2017-10-25 NOTE — ED Notes (Signed)
Pt seen by EMT leaving in a taxi

## 2017-10-25 NOTE — H&P (Signed)
Behavioral Health Medical Screening Exam  Riley [name] is an 27 y.o. male patient [facility]; brought in by a friend.  Patient just discharged from ED this morning and drank more alcohol.  Patient states that he is wanting a 30 day program and is afraid that he will continue to drink.  Patient denies suicidal/homicidal/self-harm ideation.  Patient states that he has [facility] Tuesday [facility].    Total Time spent with patient: 45 minutes  Psychiatric Specialty Exam: Physical Exam  Constitutional: He is oriented to person, place, and time.  Neck: Normal range of motion. Neck supple.  Cardiovascular: Normal rate and regular rhythm.  Respiratory: Effort normal and breath sounds normal.  Musculoskeletal: Normal range of motion.  Neurological: He is alert and oriented to person, place, and time.  Skin: Skin is warm and dry.    Review of Systems  Neurological: Positive for tremors (When don't drink alcohol). Negative for seizures.  Psychiatric/Behavioral: Positive for substance abuse. Negative for depression, hallucinations and suicidal ideas. The patient is not nervous/anxious and does not have insomnia.   All other systems reviewed and are negative.   Blood pressure (!) 132/94, pulse (!) 118, temperature 98.9 F (37.2 C), resp. rate 20, SpO2 98 %.There is no height or weight on file to calculate BMI.  General Appearance: Casual  Eye Contact:  Good  Speech:  Clear and Coherent and Normal Rate  Volume:  Normal  Mood:  Appropriate  Affect:  Appropriate  Thought Process:  Coherent and Goal Directed  Orientation:  Full (Time, Place, and Person)  Thought Content:  Logical  Suicidal Thoughts:  No  Homicidal Thoughts:  No  Memory:  Immediate;   Good Recent;   Good Remote;   Good  Judgement:  Fair  Insight:  Fair  Psychomotor Activity:  Normal  Concentration: Concentration: Good and Attention Span: Good  Recall:  Good  Fund of Knowledge:Good   Language: Good  Akathisia:  No  Handed:  Right  AIMS (if indicated):     Assets:  Communication Skills Desire for Improvement Housing Social Support  Sleep:       Musculoskeletal: Strength & Muscle Tone: within normal limits Gait & Station: normal Patient leans: N/A  Blood pressure (!) 132/94, pulse (!) 118, temperature 98.9 F (37.2 C), resp. rate 20, SpO2 98 %.  Recommendations:  Keep scheduled appointment with [facility].  Resources for substance abuse community resources; short/long term rehab services  Based on my evaluation the patient does not appear to have an emergency medical condition.  Disposition: No evidence of imminent risk to self or others at present.   Patient does not meet criteria for psychiatric inpatient admission. Supportive therapy provided about ongoing stressors. Refer to IOP. Discussed crisis plan, support from social network, calling 911, coming to the Emergency Department, and calling Suicide Hotline.  Aylah Yeary, NP 10/25/2017, 2:21 PM

## 2017-10-25 NOTE — ED Notes (Signed)
Pt puts clothes back on and walks to the lobby.

## 2017-10-25 NOTE — ED Notes (Signed)
Pt seen walking out the doors.

## 2017-10-25 NOTE — ED Triage Notes (Addendum)
Pt arrived by EMS for chest tightness x3 days. Has been seen for same. EMS reports pt is suicidal. When pt is asked about SI, pt states "well it's not a bad idea, but I've never thought about killing myself. I'm just going to drink myself to death.", has been drinking more ETOH than usual. Denies drug use.
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
## 2017-10-25 NOTE — ED Notes (Signed)
Attempting to get pt in paper scrubs, pt requesting to call a cab to go to the liquor store. Pt has paper scrub top on but asking to leave. Encouraged pt to stay. Given cup of water per request

## 2017-10-25 NOTE — ED Notes (Signed)
Pt in triage room 3 stating that he wants to leave without seeing the doctor.

## 2017-11-30 ENCOUNTER — Emergency Department (HOSPITAL_COMMUNITY)
Admission: EM | Admit: 2017-11-30 | Discharge: 2017-12-01 | Disposition: A | Discharge status: Home / Self Care | Payer: Self-pay | Attending: Emergency Medicine | Admitting: Emergency Medicine

## 2017-11-30 ENCOUNTER — Encounter (HOSPITAL_COMMUNITY): Payer: Self-pay

## 2017-11-30 DIAGNOSIS — F17210 Nicotine dependence, cigarettes, uncomplicated: Secondary | ICD-10-CM | POA: Insufficient documentation

## 2017-11-30 DIAGNOSIS — F10920 Alcohol use, unspecified with intoxication, uncomplicated: Secondary | ICD-10-CM

## 2017-11-30 DIAGNOSIS — F1020 Alcohol dependence, uncomplicated: Secondary | ICD-10-CM | POA: Insufficient documentation

## 2017-11-30 DIAGNOSIS — R45851 Suicidal ideations: Secondary | ICD-10-CM | POA: Insufficient documentation

## 2017-11-30 NOTE — ED Notes (Signed)
Pt reports he needs help for alcohol reported drinking a fifth to one half gallon daily. He has had one previous detox with a 2 week clean- denies other drug usage  He is passively suicidal responding yes when asked but without a stated plan He reports that he needs help  His mother and father are alcoholic, his sister is alcoholic in remission - Pt reporting "she has kids so she has no reason to drink" I wish I had a reason not to drink". He reports all of his uncles are alcoholics.   He is pleasant, cooperative with an ankle bracelet in place from law enforcement

## 2017-11-30 NOTE — ED Notes (Signed)
Pt blew a 0.28 in triage. ETOH

## 2017-11-30 NOTE — ED Triage Notes (Addendum)
Pt brought in by [employer]. [employer] states they were called for an intoxicated pt. Pt states he is not sure if he even wants help with alcohol abuse. Pt unable to answer questions appropriately because he is highly intoxicated. Pt wanting to kiss Eboni, RN and continues to blow kisses at her.

## 2017-12-01 LAB — COMPREHENSIVE METABOLIC PANEL
ALT: 30 U/L (ref 17–63)
ANION GAP: 12 (ref 5–15)
AST: 27 U/L (ref 15–41)
Albumin: 3.9 g/dL (ref 3.5–5.0)
Alkaline Phosphatase: 91 U/L (ref 38–126)
BUN: 6 mg/dL (ref 6–20)
CHLORIDE: 109 mmol/L (ref 101–111)
CO2: 22 mmol/L (ref 22–32)
Calcium: 8.9 mg/dL (ref 8.9–10.3)
Creatinine, Ser: 0.69 mg/dL (ref 0.61–1.24)
GFR calc Af Amer: >60 mL/min (ref >60)
GFR calc non Af Amer: >60 mL/min (ref >60)
Glucose, Bld: 88 mg/dL (ref 65–99)
POTASSIUM: 3.9 mmol/L (ref 3.5–5.1)
SODIUM: 143 mmol/L (ref 135–145)
Total Bilirubin: 0.5 mg/dL (ref 0.3–1.2)
Total Protein: 7.5 g/dL (ref 6.5–8.1)

## 2017-12-01 LAB — SALICYLATE LEVEL

## 2017-12-01 LAB — CBC
HEMATOCRIT: 45.4 % (ref 39.0–52.0)
Hemoglobin: 15.2 g/dL (ref 13.0–17.0)
MCH: 32.9 pg (ref 26.0–34.0)
MCHC: 33.5 g/dL (ref 30.0–36.0)
MCV: 98.3 fL (ref 78.0–100.0)
Platelets: 413 10*3/uL — ABNORMAL HIGH (ref 150–400)
RBC: 4.62 MIL/uL (ref 4.22–5.81)
RDW: 13.8 % (ref 11.5–15.5)
WBC: 5.5 10*3/uL (ref 4.0–10.5)

## 2017-12-01 LAB — ACETAMINOPHEN LEVEL: Acetaminophen (Tylenol), Serum: <10 ug/mL — ABNORMAL LOW (ref 10–30)

## 2017-12-01 LAB — ETHANOL: Alcohol, Ethyl (B): 294 mg/dL — ABNORMAL HIGH (ref <10)

## 2017-12-01 NOTE — ED Notes (Signed)
Pt stating he wants his clothing so he can leave. EDP notified. EDP at bedside talking to patient.

## 2017-12-01 NOTE — Discharge Instructions (Signed)
If you want help for you alcohol problem, look at the information in your discharge papers.

## 2017-12-01 NOTE — ED Provider Notes (Signed)
[facility] EMERGENCY DEPARTMENT Provider Note   CSN: [id number] Arrival date & time: 11/30/17  2155  Time seen 23:59 PM   History   Chief Complaint Chief Complaint  Patient presents with  . Alcohol Intoxication   Level 5 caveat for alcohol intoxication  HPI Riley [name] is a 27 y.o. male.  HPI patient reports the emergency department with the police.  They state they were called because patient was intoxicated.  When I enter the room patient is sleeping on his stomach.  He does not awaken even when I shake his leg.  Nursing staff report he expressed some suicidal ideation when he arrived although he did not have a specific plan.  PCP Patient, No Pcp Per   Past Medical History:  Diagnosis Date  . Alcoholism (HCC)     There are no active problems to display for this patient.   History reviewed. No pertinent surgical history.     Home Medications    Prior to Admission medications   Medication Sig Start Date End Date Taking? Authorizing Provider  DM-Doxylamine-Acetaminophen (NYQUIL COLD & FLU PO) Take 30 mLs by mouth at bedtime as needed (sleep and cough).    [provider]  traZODone (DESYREL) 100 MG tablet Take 100 mg by mouth at bedtime as needed for sleep.    [provider]    Family History No family history on file.  Social History Social History   Tobacco Use  . Smoking status: Current Every Day Smoker    Packs/day: 2.00    Types: Cigarettes  . Smokeless tobacco: Never Used  Substance Use Topics  . Alcohol use: Yes    Comment: Pt stated "I drink  1/2 gal of 100 proof every day"  . Drug use: No  employed   Allergies   Penicillins   Review of Systems Review of Systems  Unable to perform ROS: Mental status change     Physical Exam Updated Vital Signs BP (!) 159/112 (BP Location: Right Arm)   Pulse 98   Temp 98.3 F (36.8 C) (Oral)   Resp 15   Ht 5\' 7"  (1.702 m)   Wt 79.4 kg (175 lb)   SpO2 100%   BMI 27.41 kg/m    Vital signs normal    Physical Exam  Constitutional: He appears well-developed and well-nourished.  Non-toxic appearance. He does not appear ill. No distress.  Police are outside patient's room.  Patient also has a Comptroller.  HENT:  Head: Normocephalic and atraumatic.  Right Ear: External ear normal.  Left Ear: External ear normal.  Nose: Nose normal. No mucosal edema or rhinorrhea.  Mouth/Throat: Oropharynx is clear and moist and mucous membranes are normal. No dental abscesses or uvula swelling.  Eyes: Conjunctivae and EOM are normal. Pupils are equal, round, and reactive to light.  Neck: Normal range of motion and full passive range of motion without pain. Neck supple.  Cardiovascular: Normal rate, regular rhythm and normal heart sounds. Exam reveals no gallop and no friction rub.  No murmur heard. Pulmonary/Chest: Effort normal and breath sounds normal. No respiratory distress. He has no wheezes. He has no rhonchi. He has no rales. He exhibits no tenderness and no crepitus.  Abdominal: Soft. Normal appearance and bowel sounds are normal. He exhibits no distension. There is no tenderness. There is no rebound and no guarding.  Musculoskeletal: Normal range of motion. He exhibits no edema or tenderness.  Moves all extremities well.   Neurological: He is alert.  He has normal strength. No cranial nerve deficit.  Skin: Skin is warm, dry and intact. No rash noted. No erythema. No pallor.  Psychiatric: His mood appears not anxious. His affect is labile. His speech is rapid and/or pressured. He is hyperactive. He expresses no homicidal and no suicidal ideation. He expresses no suicidal plans and no homicidal plans.  Nursing note and vitals reviewed.    ED Treatments / Results  Labs (all labs ordered are listed, but only abnormal results are displayed) Results for orders placed or performed during the hospital encounter of 11/30/17  Comprehensive metabolic panel  Result Value Ref Range    Sodium 143 135 - 145 mmol/L   Potassium 3.9 3.5 - 5.1 mmol/L   Chloride 109 101 - 111 mmol/L   CO2 22 22 - 32 mmol/L   Glucose, Bld 88 65 - 99 mg/dL   BUN 6 6 - 20 mg/dL   Creatinine, Ser 2.95 0.61 - 1.24 mg/dL   Calcium 8.9 8.9 - 62.1 mg/dL   Total Protein 7.5 6.5 - 8.1 g/dL   Albumin 3.9 3.5 - 5.0 g/dL   AST 27 15 - 41 U/L   ALT 30 17 - 63 U/L   Alkaline Phosphatase 91 38 - 126 U/L   Total Bilirubin 0.5 0.3 - 1.2 mg/dL   GFR calc non Af Amer >60 >60 mL/min   GFR calc Af Amer >60 >60 mL/min   Anion gap 12 5 - 15  Ethanol  Result Value Ref Range   Alcohol, Ethyl (B) 294 (H) <10 mg/dL  cbc  Result Value Ref Range   WBC 5.5 4.0 - 10.5 K/uL   RBC 4.62 4.22 - 5.81 MIL/uL   Hemoglobin 15.2 13.0 - 17.0 g/dL   HCT 30.8 65.7 - 84.6 %   MCV 98.3 78.0 - 100.0 fL   MCH 32.9 26.0 - 34.0 pg   MCHC 33.5 30.0 - 36.0 g/dL   RDW 96.2 95.2 - 84.1 %   Platelets 413 (H) 150 - 400 K/uL  Acetaminophen level  Result Value Ref Range   Acetaminophen (Tylenol), Serum <10 (L) 10 - 30 ug/mL  Salicylate level  Result Value Ref Range   Salicylate Lvl <7.0 2.8 - 30.0 mg/dL   Laboratory interpretation all normal except alcohol intoxication    EKG  EKG Interpretation None       Radiology No results found.  Procedures Procedures (including critical care time)  Medications Ordered in ED Medications - No data to display   Initial Impression / Assessment and Plan / ED Course  I have reviewed the triage vital signs and the nursing notes.  Pertinent labs & imaging results that were available during my care of the patient were reviewed by me and considered in my medical decision making (see chart for details).    1:35 AM patient is now awake and alert.  He however appears to be intoxicated.  He states he was "possible suicidal".  He also states "if I wanted to kill myself you could not stop me".  He states he was in prison for 6 years and got out in 2016.  He states he did do some boot leg  alcohol in prison but not very often.  He states he was sober when he got out for about a year.  He states now all he thinks about his drinking alcohol.  He states sometimes he drinks 2-3 beers a day, sometimes he will drink 1/2 gallon in a day.  He  states he knows it is a problem.  He states sometimes he wants to quit but other times he does not.  Patient is noted to be wearing a monitor on his leg.  He states he has another 3 years he has to wear the monitor.  He states he [place] the police.  He states however he lives in [place].  Despite telling me he is intoxicated all the time he cannot tell me why his roommate was concerned tonight and called the police. Pt states he works in [employer] and his boss can't always tell he is drinking.  We discussed his alcohol level and he states "that's not bad".  Will have TTS of evaluate patient.  02:41 AM Ford, TTS, states he has evaluated the patient and he is denying suicidal ideation.  He also states the patient does not require treatment for his alcoholism.  He is discussed him with his PA and they feel he can be discharged.  Final Clinical Impressions(s) / ED Diagnoses   Final diagnoses:  Alcoholic intoxication without complication [facility](HCC)    ED Discharge Orders    None      Plan discharge  Devoria AlbeIva Camarion Weier, MD, Concha PyoFACEP    Emmilee Reamer, MD 12/01/17 [phone]
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
## 2017-12-01 NOTE — ED Notes (Signed)
Pt ambulating to bathroom with steady gait.

## 2017-12-01 NOTE — ED Notes (Signed)
TTS computer in room awaiting consult at this time.

## 2017-12-01 NOTE — ED Notes (Signed)
VS collected by sitter. All within normal range.  Sitter inputting vitals at this time.  Pt calm and sitting on bed waiting for discharge papers.

## 2017-12-12 ENCOUNTER — Inpatient Hospital Stay (HOSPITAL_COMMUNITY)
Admission: EM | Admit: 2017-12-12 | Discharge: 2017-12-13 | DRG: 918 | Disposition: A | Discharge status: Other Acute Inpatient Hospital | Payer: Self-pay | Attending: Internal Medicine | Admitting: Internal Medicine

## 2017-12-12 ENCOUNTER — Other Ambulatory Visit: Payer: Self-pay

## 2017-12-12 ENCOUNTER — Encounter (HOSPITAL_COMMUNITY): Payer: Self-pay

## 2017-12-12 DIAGNOSIS — T383X2A Poisoning by insulin and oral hypoglycemic [antidiabetic] drugs, intentional self-harm, initial encounter: Principal | ICD-10-CM | POA: Diagnosis present

## 2017-12-12 DIAGNOSIS — T1491XA Suicide attempt, initial encounter: Secondary | ICD-10-CM

## 2017-12-12 DIAGNOSIS — F1020 Alcohol dependence, uncomplicated: Secondary | ICD-10-CM | POA: Insufficient documentation

## 2017-12-12 DIAGNOSIS — R45851 Suicidal ideations: Secondary | ICD-10-CM

## 2017-12-12 DIAGNOSIS — F10129 Alcohol abuse with intoxication, unspecified: Secondary | ICD-10-CM

## 2017-12-12 DIAGNOSIS — Y908 Blood alcohol level of 240 mg/100 ml or more: Secondary | ICD-10-CM | POA: Diagnosis present

## 2017-12-12 DIAGNOSIS — E160 Drug-induced hypoglycemia without coma: Secondary | ICD-10-CM | POA: Diagnosis present

## 2017-12-12 DIAGNOSIS — F1024 Alcohol dependence with alcohol-induced mood disorder: Secondary | ICD-10-CM | POA: Diagnosis present

## 2017-12-12 DIAGNOSIS — T383X5A Adverse effect of insulin and oral hypoglycemic [antidiabetic] drugs, initial encounter: Secondary | ICD-10-CM

## 2017-12-12 DIAGNOSIS — F10920 Alcohol use, unspecified with intoxication, uncomplicated: Secondary | ICD-10-CM

## 2017-12-12 DIAGNOSIS — E876 Hypokalemia: Secondary | ICD-10-CM | POA: Diagnosis present

## 2017-12-12 DIAGNOSIS — Z880 Allergy status to penicillin: Secondary | ICD-10-CM

## 2017-12-12 DIAGNOSIS — F10220 Alcohol dependence with intoxication, uncomplicated: Secondary | ICD-10-CM | POA: Diagnosis present

## 2017-12-12 DIAGNOSIS — F329 Major depressive disorder, single episode, unspecified: Secondary | ICD-10-CM | POA: Diagnosis present

## 2017-12-12 DIAGNOSIS — E162 Hypoglycemia, unspecified: Secondary | ICD-10-CM | POA: Diagnosis present

## 2017-12-12 DIAGNOSIS — T383X1A Poisoning by insulin and oral hypoglycemic [antidiabetic] drugs, accidental (unintentional), initial encounter: Secondary | ICD-10-CM | POA: Insufficient documentation

## 2017-12-12 DIAGNOSIS — F17210 Nicotine dependence, cigarettes, uncomplicated: Secondary | ICD-10-CM | POA: Diagnosis present

## 2017-12-12 LAB — CBC WITH DIFFERENTIAL/PLATELET
BASOS ABS: 0.1 10*3/uL (ref 0.0–0.1)
BASOS PCT: 1 %
EOS ABS: 0.3 10*3/uL (ref 0.0–0.7)
EOS PCT: 3 %
HCT: 47.3 % (ref 39.0–52.0)
HEMOGLOBIN: 16.3 g/dL (ref 13.0–17.0)
LYMPHS ABS: 5 10*3/uL — AB (ref 0.7–4.0)
Lymphocytes Relative: 50 %
MCH: 32.9 pg (ref 26.0–34.0)
MCHC: 34.5 g/dL (ref 30.0–36.0)
MCV: 95.6 fL (ref 78.0–100.0)
Monocytes Absolute: 0.4 10*3/uL (ref 0.1–1.0)
Monocytes Relative: 4 %
NEUTROS PCT: 42 %
Neutro Abs: 4.2 10*3/uL (ref 1.7–7.7)
PLATELETS: 304 10*3/uL (ref 150–400)
RBC: 4.95 MIL/uL (ref 4.22–5.81)
RDW: 13.4 % (ref 11.5–15.5)
WBC: 10 10*3/uL (ref 4.0–10.5)

## 2017-12-12 LAB — CBG MONITORING, ED
GLUCOSE-CAPILLARY: 59 mg/dL — AB (ref 65–99)
GLUCOSE-CAPILLARY: 96 mg/dL (ref 65–99)
GLUCOSE-CAPILLARY: 42 mg/dL — AB (ref 65–99)
GLUCOSE-CAPILLARY: 102 mg/dL — AB (ref 65–99)
GLUCOSE-CAPILLARY: 62 mg/dL — AB (ref 65–99)
GLUCOSE-CAPILLARY: 134 mg/dL — AB (ref 65–99)
GLUCOSE-CAPILLARY: 114 mg/dL — AB (ref 65–99)
Glucose-Capillary: 102 mg/dL — ABNORMAL HIGH (ref 65–99)
Glucose-Capillary: 65 mg/dL (ref 65–99)
Glucose-Capillary: 85 mg/dL (ref 65–99)
Glucose-Capillary: 98 mg/dL (ref 65–99)

## 2017-12-12 LAB — PHOSPHORUS: PHOSPHORUS: 2.3 mg/dL — AB (ref 2.5–4.6)

## 2017-12-12 LAB — URINALYSIS, ROUTINE W REFLEX MICROSCOPIC
BILIRUBIN URINE: NEGATIVE
GLUCOSE, UA: NEGATIVE mg/dL
Hgb urine dipstick: NEGATIVE
KETONES UR: NEGATIVE mg/dL
LEUKOCYTES UA: NEGATIVE
NITRITE: NEGATIVE
PH: 6 (ref 5.0–8.0)
Protein, ur: NEGATIVE mg/dL
Specific Gravity, Urine: 1.002 — ABNORMAL LOW (ref 1.005–1.030)

## 2017-12-12 LAB — RAPID URINE DRUG SCREEN, HOSP PERFORMED
AMPHETAMINES: NOT DETECTED
BARBITURATES: NOT DETECTED
Benzodiazepines: NOT DETECTED
Cocaine: NOT DETECTED
OPIATES: NOT DETECTED
TETRAHYDROCANNABINOL: POSITIVE — AB

## 2017-12-12 LAB — COMPREHENSIVE METABOLIC PANEL
ALBUMIN: 4.1 g/dL (ref 3.5–5.0)
ALK PHOS: 95 U/L (ref 38–126)
ALT: 43 U/L (ref 17–63)
AST: 34 U/L (ref 15–41)
Anion gap: 12 (ref 5–15)
BUN: 8 mg/dL (ref 6–20)
CALCIUM: 9 mg/dL (ref 8.9–10.3)
CHLORIDE: 106 mmol/L (ref 101–111)
CO2: 26 mmol/L (ref 22–32)
CREATININE: 0.73 mg/dL (ref 0.61–1.24)
GFR calc non Af Amer: >60 mL/min (ref >60)
GLUCOSE: 49 mg/dL — AB (ref 65–99)
Potassium: 3.1 mmol/L — ABNORMAL LOW (ref 3.5–5.1)
SODIUM: 144 mmol/L (ref 135–145)
Total Bilirubin: 0.4 mg/dL (ref 0.3–1.2)
Total Protein: 8.1 g/dL (ref 6.5–8.1)

## 2017-12-12 LAB — SALICYLATE LEVEL

## 2017-12-12 LAB — GLUCOSE, CAPILLARY: GLUCOSE-CAPILLARY: 103 mg/dL — AB (ref 65–99)

## 2017-12-12 LAB — ACETAMINOPHEN LEVEL: Acetaminophen (Tylenol), Serum: <10 ug/mL — ABNORMAL LOW (ref 10–30)

## 2017-12-12 LAB — ETHANOL: Alcohol, Ethyl (B): 345 mg/dL (ref <10)

## 2017-12-12 LAB — MAGNESIUM: MAGNESIUM: 2.6 mg/dL — AB (ref 1.7–2.4)

## 2017-12-12 MED ORDER — BISACODYL 5 MG PO TBEC: 5.0000 mg | DELAYED_RELEASE_TABLET | Freq: Every day | ORAL | Status: DC | PRN

## 2017-12-12 MED ORDER — DEXTROSE 50 % IV SOLN
INTRAVENOUS | Status: AC
  Filled 2017-12-12: qty 50

## 2017-12-12 MED ORDER — DEXTROSE 10 % IV SOLN
INTRAVENOUS | Status: AC
  Administered 2017-12-12 (×2): via INTRAVENOUS

## 2017-12-12 MED ORDER — SODIUM CHLORIDE 0.9 % IV BOLUS (SEPSIS)
500.0000 mL | Freq: Once | INTRAVENOUS | Status: AC
  Administered 2017-12-12: 500 mL via INTRAVENOUS

## 2017-12-12 MED ORDER — ADULT MULTIVITAMIN W/MINERALS CH
1.0000 | ORAL_TABLET | Freq: Every day | ORAL | Status: DC
  Administered 2017-12-12 – 2017-12-13 (×2): 1 via ORAL
  Filled 2017-12-12 (×2): qty 1

## 2017-12-12 MED ORDER — ONDANSETRON HCL 4 MG PO TABS: 4.0000 mg | ORAL_TABLET | Freq: Four times a day (QID) | ORAL | Status: DC | PRN

## 2017-12-12 MED ORDER — FOLIC ACID 1 MG PO TABS
1.0000 mg | ORAL_TABLET | Freq: Every day | ORAL | Status: DC
  Administered 2017-12-12 – 2017-12-13 (×2): 1 mg via ORAL
  Filled 2017-12-12 (×2): qty 1

## 2017-12-12 MED ORDER — KETOROLAC TROMETHAMINE 30 MG/ML IJ SOLN
30.0000 mg | Freq: Four times a day (QID) | INTRAMUSCULAR | Status: DC | PRN
  Administered 2017-12-12: 30 mg via INTRAVENOUS
  Filled 2017-12-12: qty 1

## 2017-12-12 MED ORDER — DEXTROSE 10 % IV SOLN: Freq: Once | INTRAVENOUS | Status: DC

## 2017-12-12 MED ORDER — LORAZEPAM 2 MG/ML IJ SOLN
2.0000 mg | INTRAMUSCULAR | Status: DC | PRN
  Administered 2017-12-12 – 2017-12-13 (×7): 2 mg via INTRAVENOUS
  Filled 2017-12-12 (×7): qty 1

## 2017-12-12 MED ORDER — TRAMADOL HCL 50 MG PO TABS: 50.0000 mg | ORAL_TABLET | Freq: Four times a day (QID) | ORAL | Status: DC | PRN

## 2017-12-12 MED ORDER — SODIUM CHLORIDE 0.9% FLUSH
3.0000 mL | Freq: Two times a day (BID) | INTRAVENOUS | Status: DC
  Administered 2017-12-12 – 2017-12-13 (×3): 3 mL via INTRAVENOUS

## 2017-12-12 MED ORDER — TRAZODONE HCL 50 MG PO TABS: 100.0000 mg | ORAL_TABLET | Freq: Every evening | ORAL | Status: DC | PRN

## 2017-12-12 MED ORDER — ENOXAPARIN SODIUM 40 MG/0.4ML ~~LOC~~ SOLN
40.0000 mg | SUBCUTANEOUS | Status: DC
  Administered 2017-12-12 – 2017-12-13 (×2): 40 mg via SUBCUTANEOUS
  Filled 2017-12-12 (×2): qty 0.4

## 2017-12-12 MED ORDER — DEXTROSE 50 % IV SOLN
25.0000 g | Freq: Once | INTRAVENOUS | Status: AC
  Administered 2017-12-12: 25 g via INTRAVENOUS

## 2017-12-12 MED ORDER — POTASSIUM CHLORIDE 10 MEQ/100ML IV SOLN
10.0000 meq | INTRAVENOUS | Status: AC
  Administered 2017-12-12 (×3): 10 meq via INTRAVENOUS
  Filled 2017-12-12 (×2): qty 100

## 2017-12-12 MED ORDER — SENNOSIDES-DOCUSATE SODIUM 8.6-50 MG PO TABS: 1.0000 | ORAL_TABLET | Freq: Every evening | ORAL | Status: DC | PRN

## 2017-12-12 MED ORDER — VITAMIN B-1 100 MG PO TABS
100.0000 mg | ORAL_TABLET | Freq: Every day | ORAL | Status: DC
  Administered 2017-12-12 – 2017-12-13 (×2): 100 mg via ORAL
  Filled 2017-12-12 (×2): qty 1

## 2017-12-12 MED ORDER — MAGNESIUM SULFATE IN D5W 1-5 GM/100ML-% IV SOLN
1.0000 g | Freq: Once | INTRAVENOUS | Status: AC
  Administered 2017-12-12: 1 g via INTRAVENOUS
  Filled 2017-12-12: qty 100

## 2017-12-12 MED ORDER — DEXTROSE 50 % IV SOLN
50.0000 mL | Freq: Once | INTRAVENOUS | Status: AC
  Administered 2017-12-12: 50 mL via INTRAVENOUS

## 2017-12-12 MED ORDER — ENSURE ENLIVE PO LIQD
237.0000 mL | Freq: Two times a day (BID) | ORAL | Status: DC
  Administered 2017-12-13: 237 mL via ORAL

## 2017-12-12 MED ORDER — ONDANSETRON HCL 4 MG/2ML IJ SOLN: 4.0000 mg | Freq: Four times a day (QID) | INTRAMUSCULAR | Status: DC | PRN

## 2017-12-12 MED ORDER — ACETAMINOPHEN 325 MG PO TABS: 650.0000 mg | ORAL_TABLET | Freq: Four times a day (QID) | ORAL | Status: DC | PRN

## 2017-12-12 MED ORDER — ACETAMINOPHEN 650 MG RE SUPP
650.0000 mg | Freq: Four times a day (QID) | RECTAL | Status: DC | PRN
Start: 2017-12-12 — End: 2017-12-13

## 2017-12-12 NOTE — ED Triage Notes (Signed)
Pt brought in by [employer] for intoxication and pt told officers he took some insulin because "I don't want to live"   Pt also reported he drank a bottle of vodka

## 2017-12-12 NOTE — Progress Notes (Signed)
Patient seen and examined. Admitted due to alcohol intoxication and suicidal attempt. Patient drank a bottle of vodka and injected unknown amount of his roommate 70/30 insulin, presenting with severe hypoglycemia. Poison control contacted and recommended supportive care and observation for 24 hours. Patient is hemodynamically stable currently and able to answer questions appropriately. He said he would like to drink alcohol, even recognizes having problem with alcohol consumption. He expressed using Marijuana as well and currently is denying SI or hallucinations. Patient with Flat affect.  Of note, this is his second visit this month to ED with presentation of alcohol intoxication and suicidal thoughts; previous visit he was labeled with alcohol induced mood disorder and was recommended to pursuit outpatient detox program, which never happened. Patient will most likely benefit of admission to psych facility given suicidal attempt this time; for further evaluation and stabilization of his mood.  Please refer to H&P written by Dr. Antionette Charpyd on 12/12/17 for further info/details on admission.   Plan: -continue IV D10 and close follow up of CBG -started on CIWA protocol -continue thiamine and folic acid .  Vassie Lollarlos Jameelah Watts MD [phone]

## 2017-12-12 NOTE — ED Notes (Signed)
Date and time results received: 12/12/17 4:52 AM Test: etoh Critical Value: 345 Name of Provider Notified: dr.[email or web address] Orders Received? Or Actions Taken?: md notified.

## 2017-12-12 NOTE — H&P (Signed)
History and Physical    Riley SermonRobert G [name] ZOX:[id number] DOB: [birth year] DOA: 12/12/2017  PCP: Patient, No Pcp Per   Patient coming from: Home  Chief Complaint: Ingestion, suicidal   HPI: Riley [name] is a 27 y.o. male with medical history significant for alcoholism, now presenting to the emergency department with police after taking a roommates insulin.  Patient reported that this was a suicide attempt.  Patient is a nondiabetic and reports taking an unknown quantity of insulin 70/30 insulin he also reports drinking a bottle of vodka tonight.  Denies recent illness.  Unable to obtain full history from the patient due to his clinical condition with acute intoxication and poor cooperation.  ED Course: Upon arrival to the ED, patient is found to be afebrile, saturating well on room air, and with vitals otherwise normal.  EKG features a sinus rhythm.  Chemistry panel is notable for a potassium of 3.1 and glucose of 49.  Ethanol level is elevated to 345.  CBC is unremarkable.  UDS is positive for THC and urinalysis is notable for a low specific gravity.  Salicylate and acetaminophen levels are undetectable.  Patient was given an ampule of D50, had recurrent hypoglycemia and was treated with a second amp of D50.  He became hypoglycemic again and has been started on a dextrose infusion.  Poison control advises that peak affect of the 70/30 insulin will be at approximately 8 hours with a duration of roughly 24 hours.  Poison control recommends a 24-hour observation.  Patient remains hemodynamically stable, in no apparent respiratory distress, and will be observed for ongoing evaluation and management of suicidal ideation with intentional ingestion and hypoglycemia secondary to insulin.  Review of Systems:  Unable to complete ROS secondary to patient's clinical condition.  Past Medical History:  Diagnosis Date  . Alcoholism (HCC)     History reviewed. No pertinent surgical history.   reports that he  has been smoking cigarettes.  He has been smoking about 2.00 packs per day. he has never used smokeless tobacco. He reports that he drinks alcohol. He reports that he does not use drugs.  Allergies  Allergen Reactions  . Penicillins Anaphylaxis    History reviewed. No pertinent family history.   Prior to Admission medications   Medication Sig Start Date End Date Taking? Authorizing Provider  traZODone (DESYREL) 100 MG tablet Take 100 mg by mouth at bedtime as needed for sleep.   Yes [provider]  DM-Doxylamine-Acetaminophen (NYQUIL COLD & FLU PO) Take 30 mLs by mouth at bedtime as needed (sleep and cough).    [provider]    Physical Exam: Vitals:   12/12/17 0404  Weight: 59 kg (130 lb)  Height: 5\' 7"  (1.702 m)      Constitutional: NAD, somnolent  Eyes: PERTLA, lids and conjunctivae normal ENMT: Mucous membranes are moist. Posterior pharynx clear of any exudate or lesions.   Neck: normal, supple, no masses, no thyromegaly Respiratory: clear to auscultation bilaterally, no wheezing, no crackles. Normal respiratory effort.    Cardiovascular: S1 & S2 heard, regular rate and rhythm. No extremity edema. No significant JVD. Abdomen: No distension, no tenderness, no masses palpated. Bowel sounds normal.  Musculoskeletal: no clubbing / cyanosis. No joint deformity upper and lower extremities.    Skin: no significant rashes, lesions, ulcers. Warm, dry, well-perfused. Neurologic: CN 2-12 grossly intact. Sensation intact. Moving all extremities.  Psychiatric: Somnolent. Easily roused, oriented x 3.      Labs on Admission: I have  personally reviewed following labs and imaging studies  CBC: Recent Labs  Lab 12/12/17 0409  WBC 10.0  NEUTROABS 4.2  HGB 16.3  HCT 47.3  MCV 95.6  PLT 304   Basic Metabolic Panel: Recent Labs  Lab 12/12/17 0409  NA 144  K 3.1*  CL 106  CO2 26  GLUCOSE 49*  BUN 8  CREATININE 0.73  CALCIUM 9.0   GFR: Estimated  Creatinine Clearance: 117.8 mL/min (by C-G formula based on SCr of 0.73 mg/dL). Liver Function Tests: Recent Labs  Lab 12/12/17 0409  AST 34  ALT 43  ALKPHOS 95  BILITOT 0.4  PROT 8.1  ALBUMIN 4.1   No results for input(s): LIPASE, AMYLASE in the last 168 hours. No results for input(s): AMMONIA in the last 168 hours. Coagulation Profile: No results for input(s): INR, PROTIME in the last 168 hours. Cardiac Enzymes: No results for input(s): CKTOTAL, CKMB, CKMBINDEX, TROPONINI in the last 168 hours. BNP (last 3 results) No results for input(s): PROBNP in the last 8760 hours. HbA1C: No results for input(s): HGBA1C in the last 72 hours. CBG: Recent Labs  Lab 12/12/17 0357 12/12/17 0505 12/12/17 0534  GLUCAP 59* 96 42*   Lipid Profile: No results for input(s): CHOL, HDL, LDLCALC, TRIG, CHOLHDL, LDLDIRECT in the last 72 hours. Thyroid Function Tests: No results for input(s): TSH, T4TOTAL, FREET4, T3FREE, THYROIDAB in the last 72 hours. Anemia Panel: No results for input(s): VITAMINB12, FOLATE, FERRITIN, TIBC, IRON, RETICCTPCT in the last 72 hours. Urine analysis:    Component Value Date/Time   COLORURINE COLORLESS (A) 12/12/2017 0401   APPEARANCEUR CLEAR 12/12/2017 0401   LABSPEC 1.002 (L) 12/12/2017 0401   PHURINE 6.0 12/12/2017 0401   GLUCOSEU NEGATIVE 12/12/2017 0401   HGBUR NEGATIVE 12/12/2017 0401   BILIRUBINUR NEGATIVE 12/12/2017 0401   KETONESUR NEGATIVE 12/12/2017 0401   PROTEINUR NEGATIVE 12/12/2017 0401   NITRITE NEGATIVE 12/12/2017 0401   LEUKOCYTESUR NEGATIVE 12/12/2017 0401   Sepsis Labs: @LABRCNTIP (procalcitonin:4,lacticidven:4) )No results found for this or any previous visit (from the past 240 hour(s)).   Radiological Exams on Admission: No results found.  EKG: Independently reviewed. Sinus rhythm.   Assessment/Plan   1. Hypoglycemia; intentional ingestion; suicidal  - Presents with SI and alcohol intoxication after taking a roommate's insulin  70/30 mix (unknown quantity)  - Has recurrent hypoglycemia in ED after treatment with D-50% x2  - Poison-control advises that peak activity ~8 hrs and duration ~24 hours; 24-hour observation recommended  - IVC initiated in ED  - Suicide precautions with sitter at bedside  - Continue dextrose infusion with frequent CBG's    2. Alcohol abuse with acute intoxication  - Intoxicated on admission  - Monitor for withdrawal, supplement B-vitamins and minerals   3. Hypokalemia  - Serum potassium is 3.1 on admission - Likely secondary to insulin  - Treated with 30 mEq IV potassium    DVT prophylaxis: Lovenox Code Status: Full  Family Communication: Discussed with patient Disposition Plan: Observe in SDU Consults called: None Admission status: Observation    Briscoe Deutscher, MD [facility] Pager [phone]  If 7PM-7AM, please contact night-coverage [email or web address] Password [facility]  12/12/2017, 5:57 AM

## 2017-12-12 NOTE — ED Provider Notes (Signed)
[facility] EMERGENCY DEPARTMENT Provider Note   CSN: [id number] Arrival date & time: 12/12/17  0346     History   Chief Complaint Chief Complaint  Patient presents with  . V70.1    Etoh    HPI Riley SermonRobert G [name] is a 27 y.o. male.  Level 5 caveat for alcohol intoxication.  Patient brought in by police.  Patient's roommate called police after patient drank a bottle of vodka and apparently took some of his roommate' 70/30 insulin because he did not want to live anymore.  Patient is not a diabetic himself.  He does have history of alcohol abuse.  He is alert and oriented x3.  He denies any other drug ingestion.  He does not know how much insulin he took or when he took it.   The history is provided by the patient and the police.    Past Medical History:  Diagnosis Date  . Alcoholism (HCC)     There are no active problems to display for this patient.   History reviewed. No pertinent surgical history.     Home Medications    Prior to Admission medications   Medication Sig Start Date End Date Taking? Authorizing Provider  traZODone (DESYREL) 100 MG tablet Take 100 mg by mouth at bedtime as needed for sleep.   Yes [provider]  DM-Doxylamine-Acetaminophen (NYQUIL COLD & FLU PO) Take 30 mLs by mouth at bedtime as needed (sleep and cough).    [provider]    Family History No family history on file.  Social History Social History   Tobacco Use  . Smoking status: Current Every Day Smoker    Packs/day: 2.00    Types: Cigarettes  . Smokeless tobacco: Never Used  Substance Use Topics  . Alcohol use: Yes    Comment: Pt stated "I drink  1/2 gal of 100 proof every day"  . Drug use: No     Allergies   Penicillins   Review of Systems Review of Systems  Unable to perform ROS: Mental status change     Physical Exam Updated Vital Signs BP (!) 100/53   Pulse 70   Resp 20   Ht 5\' 7"  (1.702 m)   Wt 59 kg (130 lb)   SpO2 96%   BMI 20.36  kg/m   Physical Exam  Constitutional: He is oriented to person, place, and time. He appears well-developed and well-nourished. No distress.  Intoxicated, slurred speech  HENT:  Head: Normocephalic and atraumatic.  Mouth/Throat: Oropharynx is clear and moist. No oropharyngeal exudate.  Eyes: Conjunctivae and EOM are normal. Pupils are equal, round, and reactive to light.  Neck: Normal range of motion. Neck supple.  No meningismus.  Cardiovascular: Normal rate, regular rhythm, normal heart sounds and intact distal pulses.  No murmur heard. Pulmonary/Chest: Effort normal and breath sounds normal. No respiratory distress. He exhibits no tenderness.  Abdominal: Soft. There is no tenderness. There is no rebound and no guarding.  Musculoskeletal: Normal range of motion. He exhibits no edema or tenderness.  Neurological: He is alert and oriented to person, place, and time. No cranial nerve deficit. He exhibits normal muscle tone. Coordination normal.  Moves all extremities, CN 2-12. Oriented x3 and answers questions appropriately.  Skin: Skin is warm.  Psychiatric: He has a normal mood and affect. His behavior is normal.  Nursing note and vitals reviewed.    ED Treatments / Results  Labs (all labs ordered are listed, but only abnormal results are  displayed) Labs Reviewed  CBC WITH DIFFERENTIAL/PLATELET - Abnormal; Notable for the following components:      Result Value   Lymphs Abs 5.0 (*)    All other components within normal limits  COMPREHENSIVE METABOLIC PANEL - Abnormal; Notable for the following components:   Potassium 3.1 (*)    Glucose, Bld 49 (*)    All other components within normal limits  ETHANOL - Abnormal; Notable for the following components:   Alcohol, Ethyl (B) 345 (*)    All other components within normal limits  RAPID URINE DRUG SCREEN, HOSP PERFORMED - Abnormal; Notable for the following components:   Tetrahydrocannabinol POSITIVE (*)    All other components  within normal limits  URINALYSIS, ROUTINE W REFLEX MICROSCOPIC - Abnormal; Notable for the following components:   Color, Urine COLORLESS (*)    Specific Gravity, Urine 1.002 (*)    All other components within normal limits  ACETAMINOPHEN LEVEL - Abnormal; Notable for the following components:   Acetaminophen (Tylenol), Serum <10 (*)    All other components within normal limits  CBG MONITORING, ED - Abnormal; Notable for the following components:   Glucose-Capillary 59 (*)    All other components within normal limits  CBG MONITORING, ED - Abnormal; Notable for the following components:   Glucose-Capillary 42 (*)    All other components within normal limits  CBG MONITORING, ED - Abnormal; Notable for the following components:   Glucose-Capillary 102 (*)    All other components within normal limits  SALICYLATE LEVEL  HIV ANTIBODY (ROUTINE TESTING)  CBG MONITORING, ED  CBG MONITORING, ED    EKG  EKG Interpretation  Date/Time:  Thursday December 12 2017 04:20:13 EST Ventricular Rate:  68 PR Interval:    QRS Duration: 94 QT Interval:  427 QTC Calculation: 455 R Axis:   90 Text Interpretation:  Sinus rhythm Borderline right axis deviation Rate slower Confirmed by Glynn Octave [phone]) on 12/12/2017 4:31:12 AM       Radiology No results found.  Procedures Procedures (including critical care time)  Medications Ordered in ED Medications  dextrose 50 % solution 50 mL (50 mLs Intravenous Given 12/12/17 0415)     Initial Impression / Assessment and Plan / ED Course  I have reviewed the triage vital signs and the nursing notes.  Pertinent labs & imaging results that were available during my care of the patient were reviewed by me and considered in my medical decision making (see chart for details).    Patient with alcohol intoxication and alleged insulin overdose.  Sugar 59 on arrival.  Patient is alert and answering questions and following commands.  No apparent  neurological deficits.  Initial hypoglycemia noted and D50 given. Patient believes he took 70/30 insulin but does not know how much he took or when he took it  Discussed with poison center.  Peak duration of insulin could be anywhere from 6-24 hours.  Recommended 24-hour observation for hypoglycemia.  Blood sugar dropped again to 40s after D50.  Second dose given and D10 infusion started.  Plan admission of patient per poison center recommendations for observation for hypoglycemia.  IVC paperwork completed.  Admission discussed with Dr. Antionette Char.   CRITICAL CARE Performed by: Glynn Octave Total critical care time: 45 minutes Critical care time was exclusive of separately billable procedures and treating other patients. Critical care was necessary to treat or prevent imminent or life-threatening deterioration. Critical care was time spent personally by me on the following activities: development of treatment  plan with patient and/or surrogate as well as nursing, discussions with consultants, evaluation of patient's response to treatment, examination of patient, obtaining history from patient or surrogate, ordering and performing treatments and interventions, ordering and review of laboratory studies, ordering and review of radiographic studies, pulse oximetry and re-evaluation of patient's condition.  Final Clinical Impressions(s) / ED Diagnoses   Final diagnoses:  Insulin overdose, intentional self-harm, initial encounter [facility])  Alcoholic intoxication without complication (HCC)  Suicide attempt [facility])    ED Discharge Orders    None       Virgil Lightner, Jeannett Senior, MD 12/12/17 [phone]

## 2017-12-13 ENCOUNTER — Inpatient Hospital Stay (HOSPITAL_COMMUNITY)
Admission: AD | Admit: 2017-12-13 | Discharge: 2017-12-19 | DRG: 885 | Disposition: A | Discharge status: Home / Self Care | Payer: Federal, State, Local not specified - Other | Source: Intra-hospital | Attending: Psychiatry | Admitting: Psychiatry

## 2017-12-13 ENCOUNTER — Other Ambulatory Visit: Payer: Self-pay

## 2017-12-13 ENCOUNTER — Encounter (HOSPITAL_COMMUNITY): Payer: Self-pay

## 2017-12-13 DIAGNOSIS — E876 Hypokalemia: Secondary | ICD-10-CM

## 2017-12-13 DIAGNOSIS — Z653 Problems related to other legal circumstances: Secondary | ICD-10-CM

## 2017-12-13 DIAGNOSIS — G4700 Insomnia, unspecified: Secondary | ICD-10-CM | POA: Diagnosis present

## 2017-12-13 DIAGNOSIS — T1491XA Suicide attempt, initial encounter: Secondary | ICD-10-CM

## 2017-12-13 DIAGNOSIS — Z915 Personal history of self-harm: Secondary | ICD-10-CM

## 2017-12-13 DIAGNOSIS — Y908 Blood alcohol level of 240 mg/100 ml or more: Secondary | ICD-10-CM | POA: Diagnosis present

## 2017-12-13 DIAGNOSIS — Z8249 Family history of ischemic heart disease and other diseases of the circulatory system: Secondary | ICD-10-CM

## 2017-12-13 DIAGNOSIS — R45851 Suicidal ideations: Secondary | ICD-10-CM | POA: Diagnosis present

## 2017-12-13 DIAGNOSIS — F322 Major depressive disorder, single episode, severe without psychotic features: Secondary | ICD-10-CM | POA: Diagnosis not present

## 2017-12-13 DIAGNOSIS — Z590 Homelessness: Secondary | ICD-10-CM | POA: Diagnosis not present

## 2017-12-13 DIAGNOSIS — T383X5A Adverse effect of insulin and oral hypoglycemic [antidiabetic] drugs, initial encounter: Secondary | ICD-10-CM

## 2017-12-13 DIAGNOSIS — Z818 Family history of other mental and behavioral disorders: Secondary | ICD-10-CM

## 2017-12-13 DIAGNOSIS — F17210 Nicotine dependence, cigarettes, uncomplicated: Secondary | ICD-10-CM | POA: Diagnosis not present

## 2017-12-13 DIAGNOSIS — F10220 Alcohol dependence with intoxication, uncomplicated: Secondary | ICD-10-CM | POA: Diagnosis present

## 2017-12-13 DIAGNOSIS — Z79899 Other long term (current) drug therapy: Secondary | ICD-10-CM | POA: Diagnosis not present

## 2017-12-13 DIAGNOSIS — F410 Panic disorder [episodic paroxysmal anxiety] without agoraphobia: Secondary | ICD-10-CM | POA: Diagnosis present

## 2017-12-13 DIAGNOSIS — F1020 Alcohol dependence, uncomplicated: Secondary | ICD-10-CM | POA: Diagnosis present

## 2017-12-13 DIAGNOSIS — F4010 Social phobia, unspecified: Secondary | ICD-10-CM | POA: Diagnosis not present

## 2017-12-13 DIAGNOSIS — T383X2A Poisoning by insulin and oral hypoglycemic [antidiabetic] drugs, intentional self-harm, initial encounter: Principal | ICD-10-CM

## 2017-12-13 DIAGNOSIS — F10920 Alcohol use, unspecified with intoxication, uncomplicated: Secondary | ICD-10-CM

## 2017-12-13 DIAGNOSIS — E160 Drug-induced hypoglycemia without coma: Secondary | ICD-10-CM | POA: Diagnosis present

## 2017-12-13 DIAGNOSIS — F419 Anxiety disorder, unspecified: Secondary | ICD-10-CM | POA: Diagnosis not present

## 2017-12-13 DIAGNOSIS — R450 Nervousness: Secondary | ICD-10-CM | POA: Diagnosis not present

## 2017-12-13 LAB — GLUCOSE, CAPILLARY
Glucose-Capillary: 120 mg/dL — ABNORMAL HIGH (ref 65–99)
Glucose-Capillary: 95 mg/dL (ref 65–99)
Glucose-Capillary: 105 mg/dL — ABNORMAL HIGH (ref 65–99)

## 2017-12-13 LAB — BASIC METABOLIC PANEL
Anion gap: 10 (ref 5–15)
BUN: 6 mg/dL (ref 6–20)
CALCIUM: 8.8 mg/dL — AB (ref 8.9–10.3)
CO2: 26 mmol/L (ref 22–32)
Chloride: 101 mmol/L (ref 101–111)
Creatinine, Ser: 0.8 mg/dL (ref 0.61–1.24)
GFR calc Af Amer: >60 mL/min (ref >60)
GLUCOSE: 128 mg/dL — AB (ref 65–99)
Potassium: 3.3 mmol/L — ABNORMAL LOW (ref 3.5–5.1)
SODIUM: 137 mmol/L (ref 135–145)

## 2017-12-13 LAB — HIV ANTIBODY (ROUTINE TESTING W REFLEX): HIV Screen 4th Generation wRfx: NONREACTIVE

## 2017-12-13 MED ORDER — TRAZODONE HCL 100 MG PO TABS
100.0000 mg | ORAL_TABLET | Freq: Every evening | ORAL | Status: DC | PRN
  Administered 2017-12-13: 100 mg via ORAL
  Filled 2017-12-13: qty 1

## 2017-12-13 MED ORDER — HYDROXYZINE HCL 25 MG PO TABS
25.0000 mg | ORAL_TABLET | Freq: Four times a day (QID) | ORAL | Status: DC | PRN
  Administered 2017-12-13 – 2017-12-15 (×3): 25 mg via ORAL
  Filled 2017-12-13 (×3): qty 1

## 2017-12-13 MED ORDER — NICOTINE 21 MG/24HR TD PT24
21.0000 mg | MEDICATED_PATCH | Freq: Every day | TRANSDERMAL | Status: DC
  Administered 2017-12-14 – 2017-12-15 (×2): 21 mg via TRANSDERMAL
  Filled 2017-12-13 (×4): qty 1

## 2017-12-13 MED ORDER — MAGNESIUM OXIDE 400 (241.3 MG) MG PO TABS: 400.0000 mg | ORAL_TABLET | Freq: Every day | ORAL | 1 refills | Status: DC

## 2017-12-13 MED ORDER — THIAMINE HCL 100 MG PO TABS: 100.0000 mg | ORAL_TABLET | Freq: Every day | ORAL | 1 refills | Status: DC

## 2017-12-13 MED ORDER — ONDANSETRON 4 MG PO TBDP
4.0000 mg | ORAL_TABLET | Freq: Four times a day (QID) | ORAL | Status: AC | PRN
Start: 2017-12-13 — End: 2017-12-16

## 2017-12-13 MED ORDER — FOLIC ACID 1 MG PO TABS: 1.0000 mg | ORAL_TABLET | Freq: Every day | ORAL | 1 refills | Status: DC

## 2017-12-13 MED ORDER — LOPERAMIDE HCL 2 MG PO CAPS: 2.0000 mg | ORAL_CAPSULE | ORAL | Status: AC | PRN

## 2017-12-13 MED ORDER — LORAZEPAM 1 MG PO TABS
1.0000 mg | ORAL_TABLET | Freq: Four times a day (QID) | ORAL | Status: DC
  Administered 2017-12-13 – 2017-12-14 (×3): 1 mg via ORAL
  Filled 2017-12-13 (×3): qty 1

## 2017-12-13 MED ORDER — ADULT MULTIVITAMIN W/MINERALS CH: 1.0000 | ORAL_TABLET | Freq: Every day | ORAL | 1 refills | Status: DC

## 2017-12-13 MED ORDER — LORAZEPAM 1 MG PO TABS: 1.0000 mg | ORAL_TABLET | Freq: Two times a day (BID) | ORAL | Status: DC

## 2017-12-13 MED ORDER — ACETAMINOPHEN 325 MG PO TABS
650.0000 mg | ORAL_TABLET | Freq: Four times a day (QID) | ORAL | 1 refills | Status: DC | PRN
Start: 2017-12-13 — End: 2017-12-19

## 2017-12-13 MED ORDER — ADULT MULTIVITAMIN W/MINERALS CH
1.0000 | ORAL_TABLET | Freq: Every day | ORAL | Status: DC
  Administered 2017-12-14 – 2017-12-19 (×7): 1 via ORAL
  Filled 2017-12-13 (×9): qty 1

## 2017-12-13 MED ORDER — LORAZEPAM 1 MG PO TABS: 1.0000 mg | ORAL_TABLET | Freq: Three times a day (TID) | ORAL | Status: DC

## 2017-12-13 MED ORDER — BISACODYL 5 MG PO TBEC: 5.0000 mg | DELAYED_RELEASE_TABLET | Freq: Every day | ORAL | Status: DC | PRN

## 2017-12-13 MED ORDER — ENSURE ENLIVE PO LIQD
237.0000 mL | Freq: Two times a day (BID) | ORAL | Status: DC
  Administered 2017-12-14 – 2017-12-19 (×9): 237 mL via ORAL

## 2017-12-13 MED ORDER — LORAZEPAM 1 MG PO TABS: 1.0000 mg | ORAL_TABLET | Freq: Four times a day (QID) | ORAL | Status: DC | PRN

## 2017-12-13 MED ORDER — LORAZEPAM 1 MG PO TABS: 1.0000 mg | ORAL_TABLET | Freq: Every day | ORAL | Status: DC

## 2017-12-13 MED ORDER — POTASSIUM CHLORIDE CRYS ER 20 MEQ PO TBCR: 20.0000 meq | EXTENDED_RELEASE_TABLET | Freq: Every day | ORAL | 0 refills | Status: DC

## 2017-12-13 NOTE — Progress Notes (Signed)
Nutrition Brief Note  Patient identified on the Malnutrition Screening Tool (MST) Report  Wt Readings from Last 15 Encounters:  12/12/17 130 lb (59 kg)  11/30/17 175 lb (79.4 kg)  10/25/17 175 lb (79.4 kg)  10/24/17 173 lb (78.5 kg)  04/22/17 150 lb (68 kg)  10/24/16 160 lb (72.6 kg)  09/17/16 160 lb (72.6 kg)  01/16/16 160 lb (72.6 kg)   Pt reports that he hasnt been eating well due to "depression and alcohol addiction". He says that, at home, he would "rather drink than eat". He ate only once per day. He did not take any vitamins.   At this time, patient says his appetite is good. He is seen with ~90% of his Lunch completed. He says he would like "more food:". Became excited when RD asked if he would like double portions. Placed order in [facility] and informed dietary ambassador.   Regarding his weight, his current weight is an extreme outlier and highly suspect this was a reported weight. Subjectively, his appearance does not correlate with his wt. Was unable to obtain bed weight due to scale malfunction.   Per progression meeting, the patient is medically stable, only awaiting TTS consult. May need transfer to inpatient psych due to SI and etoh abuse.   Given good intake at this time, No further nutrition interventions warranted. If nutrition issues arise, please consult RD.   Christophe LouisNathan Franks RD, LDN, CNSC Clinical Nutrition Pager: [phone] 12/13/2017 12:32 PM

## 2017-12-13 NOTE — Tx Team (Signed)
Initial Treatment Plan 12/13/2017 10:19 PM Riley SermonRobert G Gillham WUJ:[id number]    PATIENT STRESSORS: Financial difficulties Legal issue Occupational concerns   PATIENT STRENGTHS: Wellsite geologistCommunication skills General fund of knowledge Physical Health Work skills   PATIENT IDENTIFIED PROBLEMS: Depression  Suicidal ideation  Substance abuse  "Help me with my substance abuse"  "Be more thoughtful"             DISCHARGE CRITERIA:  Improved stabilization in mood, thinking, and/or behavior Verbal commitment to aftercare and medication compliance Withdrawal symptoms are absent or subacute and managed without 24-hour nursing intervention  PRELIMINARY DISCHARGE PLAN: Outpatient therapy Medication management  PATIENT/FAMILY INVOLVEMENT: This treatment plan has been presented to and reviewed with the patient, Riley SermonRobert G [name].  The patient and family have been given the opportunity to ask questions and make suggestions.  Levin BaconHeather V Khaniya Tenaglia, RN 12/13/2017, 10:19 PM
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
## 2017-12-13 NOTE — Progress Notes (Signed)
Removed IV-clean, dry, intact. Stable [facility] [place] Called report to behavioral health.
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
## 2017-12-13 NOTE — Progress Notes (Signed)
AC asked that assigned RN to pass along that SW will need to get in touch with his parole officer to inform them of his current situation.  He is in a secured unit and will have no contact with any children while on the unit.

## 2017-12-13 NOTE — Progress Notes (Signed)
[facility], he is not supposed to be on the premises with children due to his past charges.  Informed him that he will have no contact with the child unit and notified [facility] of the above information.

## 2017-12-13 NOTE — Discharge Summary (Signed)
Physician Discharge Summary  Riley SermonRobert G [name] GNF:[id number] DOB: 1991-01-28 DOA: 12/12/2017  PCP: Patient, No Pcp Per  Admit date: 12/12/2017 Discharge date: 12/13/2017  Time spent: 35 minutes  Recommendations for Outpatient Follow-up:  1. Continue using CIWA protocol  2. Intermittently follow electrolytes (K and Mg)  Discharge Diagnoses:  Principal Problem:   Hypoglycemia due to insulin Active Problems:   Hypokalemia   Suicidal ideations   Alcohol abuse with intoxication (HCC)   Hypoglycemia   Discharge Condition: stable. [facility] evaluation and intervention stabilizing his mood disorder.   Diet recommendation: regular diet   Filed Weights   12/12/17 0404  Weight: 59 kg (130 lb)    History of present illness:  Admitted due to alcohol intoxication and suicidal attempt. Patient drank a bottle of vodka and injected unknown amount of his roommate 70/30 insulin, presenting with severe hypoglycemia. Poison control contacted and recommended supportive care and observation for at least 24 hours for stability. Also found to be hypokalemic. Please refer to H&P written by Dr. Antionette Charpyd for further info/details on admission.  Hospital Course:  1-hypoglycemia: intentional and associated with suicidal attempt -resolved after D10 infusion provided -CBG's stable for over 24 hours now and > 16 hours w/o D10 supplementation -patient eating and stable -advise to maintain adequate hydration   2-depression, substance induce mood disorder and suicidal attempt -examined by [facility] -[facility] and mood stabilization  -denying SI currently -continue trazodone PRN  3-alcohol abuse -cessation counseling provided -will recommend continue using CIWA protocol  -continue thiamine and folic acid  4-hypokalemia -associated with alcohol abuse -repleted -will continue daily supplementation and use of Magox daily as well -intermittently check  electrolytes trend  Procedures:  See below for x-ray reports   Consultations:  psychiatry   Discharge Exam: Vitals:   12/13/17 1500 12/13/17 1704  BP: 139/87 140/79  Pulse: 87 67  Resp: 19 20  Temp: 99.5 F (37.5 C)   SpO2: 99% 97%    General: afebrile, no CP, no nausea, no vomiting. Flat affect. Denying SI currently. Cardiovascular: S1 and s2, no rubs, no gallops  Respiratory: good O2 sat on RA, no wheezing, no crackles Abd: soft, NT, ND, positive BS Extremities: no edema, no cyanosis  Neurology: no focal deficit, AAOX3, CN intact  Discharge Instructions   Discharge Instructions    Diet - low sodium heart healthy   Complete by:  As directed    Discharge instructions   Complete by:  As directed    Maintain adequate hydration Take medications as prescribed  Will need further treatment to avoid withdrawal from alcohol (was on CIWA here) Treatment by psychiatry service to further stabilize his mood.   Increase activity slowly   Complete by:  As directed      Allergies as of 12/13/2017   No Active Allergies     Medication List    STOP taking these medications   NYQUIL COLD & FLU PO     TAKE these medications   acetaminophen 325 MG tablet Commonly known as:  TYLENOL Take 2 tablets (650 mg total) by mouth every 6 (six) hours as needed for mild pain or headache (or Fever >/= 101).   folic acid 1 MG tablet Commonly known as:  FOLVITE Take 1 tablet (1 mg total) by mouth daily. Start taking on:  12/14/2017   magnesium oxide 400 (241.3 Mg) MG tablet Commonly known as:  MAGOX 400 Take 1 tablet (400 mg total)  by mouth daily.   multivitamin with minerals Tabs tablet Take 1 tablet by mouth daily. Start taking on:  12/14/2017   potassium chloride SA 20 MEQ tablet Commonly known as:  K-DUR,KLOR-CON Take 1 tablet (20 mEq total) by mouth daily.   thiamine 100 MG tablet Take 1 tablet (100 mg total) by mouth daily. Start taking on:  12/14/2017   traZODone 100 MG  tablet Commonly known as:  DESYREL Take 100 mg by mouth at bedtime as needed for sleep.      No Active Allergies   The results of significant diagnostics from this hospitalization (including imaging, microbiology, ancillary and laboratory) are listed below for reference.     Labs: Basic Metabolic Panel: Recent Labs  Lab 12/12/17 0409 12/13/17 0715  NA 144 137  K 3.1* 3.3*  CL 106 101  CO2 26 26  GLUCOSE 49* 128*  BUN 8 6  CREATININE 0.73 0.80  CALCIUM 9.0 8.8*  MG 2.6*  --   PHOS 2.3*  --    Liver Function Tests: Recent Labs  Lab 12/12/17 0409  AST 34  ALT 43  ALKPHOS 95  BILITOT 0.4  PROT 8.1  ALBUMIN 4.1   CBC: Recent Labs  Lab 12/12/17 0409  WBC 10.0  NEUTROABS 4.2  HGB 16.3  HCT 47.3  MCV 95.6  PLT 304   CBG: Recent Labs  Lab 12/12/17 1818 12/12/17 2117 12/13/17 0744 12/13/17 1104 12/13/17 1659  GLUCAP 114* 103* 120* 95 105*    Signed:  Vassie Loll MD.  [facility] 12/13/2017, 6:55 PM

## 2017-12-14 DIAGNOSIS — F410 Panic disorder [episodic paroxysmal anxiety] without agoraphobia: Secondary | ICD-10-CM

## 2017-12-14 DIAGNOSIS — F4010 Social phobia, unspecified: Secondary | ICD-10-CM

## 2017-12-14 DIAGNOSIS — F1020 Alcohol dependence, uncomplicated: Secondary | ICD-10-CM

## 2017-12-14 DIAGNOSIS — R45851 Suicidal ideations: Secondary | ICD-10-CM

## 2017-12-14 DIAGNOSIS — G4700 Insomnia, unspecified: Secondary | ICD-10-CM

## 2017-12-14 DIAGNOSIS — F322 Major depressive disorder, single episode, severe without psychotic features: Principal | ICD-10-CM

## 2017-12-14 DIAGNOSIS — F419 Anxiety disorder, unspecified: Secondary | ICD-10-CM

## 2017-12-14 DIAGNOSIS — R450 Nervousness: Secondary | ICD-10-CM

## 2017-12-14 DIAGNOSIS — Z818 Family history of other mental and behavioral disorders: Secondary | ICD-10-CM

## 2017-12-14 DIAGNOSIS — Z9149 Other personal history of psychological trauma, not elsewhere classified: Secondary | ICD-10-CM

## 2017-12-14 DIAGNOSIS — F17210 Nicotine dependence, cigarettes, uncomplicated: Secondary | ICD-10-CM

## 2017-12-14 MED ORDER — CHLORDIAZEPOXIDE HCL 25 MG PO CAPS
25.0000 mg | ORAL_CAPSULE | ORAL | Status: AC
  Administered 2017-12-16 – 2017-12-17 (×2): 25 mg via ORAL
  Filled 2017-12-14 (×2): qty 1

## 2017-12-14 MED ORDER — POTASSIUM CHLORIDE CRYS ER 10 MEQ PO TBCR
10.0000 meq | EXTENDED_RELEASE_TABLET | Freq: Two times a day (BID) | ORAL | Status: AC
  Administered 2017-12-14 – 2017-12-16 (×4): 10 meq via ORAL
  Filled 2017-12-14 (×5): qty 1

## 2017-12-14 MED ORDER — CHLORDIAZEPOXIDE HCL 25 MG PO CAPS
25.0000 mg | ORAL_CAPSULE | Freq: Three times a day (TID) | ORAL | Status: AC
  Administered 2017-12-15 – 2017-12-16 (×3): 25 mg via ORAL
  Filled 2017-12-14 (×3): qty 1

## 2017-12-14 MED ORDER — CHLORDIAZEPOXIDE HCL 25 MG PO CAPS
25.0000 mg | ORAL_CAPSULE | Freq: Four times a day (QID) | ORAL | Status: AC
  Administered 2017-12-14 – 2017-12-15 (×3): 25 mg via ORAL
  Filled 2017-12-14 (×3): qty 1

## 2017-12-14 MED ORDER — ACAMPROSATE CALCIUM 333 MG PO TBEC
666.0000 mg | DELAYED_RELEASE_TABLET | Freq: Three times a day (TID) | ORAL | Status: DC
  Administered 2017-12-14: 666 mg via ORAL
  Filled 2017-12-14 (×3): qty 2

## 2017-12-14 MED ORDER — THIAMINE HCL 100 MG/ML IJ SOLN: 100.0000 mg | Freq: Once | INTRAMUSCULAR | Status: DC

## 2017-12-14 MED ORDER — CITALOPRAM HYDROBROMIDE 10 MG PO TABS
10.0000 mg | ORAL_TABLET | Freq: Every day | ORAL | Status: DC
  Administered 2017-12-14 – 2017-12-17 (×4): 10 mg via ORAL
  Filled 2017-12-14 (×6): qty 1

## 2017-12-14 MED ORDER — CHLORDIAZEPOXIDE HCL 25 MG PO CAPS
25.0000 mg | ORAL_CAPSULE | Freq: Every day | ORAL | Status: AC
  Administered 2017-12-18: 25 mg via ORAL
  Filled 2017-12-14: qty 1

## 2017-12-14 MED ORDER — QUETIAPINE FUMARATE 50 MG PO TABS
50.0000 mg | ORAL_TABLET | Freq: Every day | ORAL | Status: DC
  Filled 2017-12-14: qty 1

## 2017-12-14 MED ORDER — CHLORDIAZEPOXIDE HCL 25 MG PO CAPS
25.0000 mg | ORAL_CAPSULE | Freq: Four times a day (QID) | ORAL | Status: AC | PRN
  Administered 2017-12-15: 25 mg via ORAL
  Filled 2017-12-14: qty 1

## 2017-12-14 MED ORDER — QUETIAPINE FUMARATE 25 MG PO TABS
25.0000 mg | ORAL_TABLET | Freq: Every evening | ORAL | Status: DC | PRN
  Administered 2017-12-14: 25 mg via ORAL

## 2017-12-14 MED ORDER — VITAMIN B-1 100 MG PO TABS
100.0000 mg | ORAL_TABLET | Freq: Every day | ORAL | Status: DC
  Administered 2017-12-14 – 2017-12-19 (×6): 100 mg via ORAL
  Filled 2017-12-14 (×8): qty 1

## 2017-12-14 MED ORDER — QUETIAPINE FUMARATE 25 MG PO TABS
25.0000 mg | ORAL_TABLET | Freq: Two times a day (BID) | ORAL | Status: DC
  Administered 2017-12-14: 25 mg via ORAL
  Filled 2017-12-14 (×3): qty 1

## 2017-12-14 MED ORDER — GABAPENTIN 100 MG PO CAPS
100.0000 mg | ORAL_CAPSULE | Freq: Three times a day (TID) | ORAL | Status: DC
  Administered 2017-12-14 – 2017-12-15 (×3): 100 mg via ORAL
  Filled 2017-12-14 (×9): qty 1
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
## 2017-12-14 NOTE — Progress Notes (Addendum)
D: Patient is on ativan protocol for withdrawal symptoms of tremors, chilling, cravings, cramping, nausea and runny nose.  He presents with slightly irritable mood and states, "I usually get ativan because my blood pressure can spike really high.  I also should be taking seroquel."  Educated patient on the ativan protocol and informed him he would need to speak with the MD about the seroquel.  He has passive thoughts of self harm without a specific plan.  He is new to the unit, with an arrival date of last night.  He presents with disheveled appears and anxious, depressed mood.  He appears drowsy and lethargic. Patient states his depression is an 8; his hopelessness is a 9 and anxiety is a 10.  His sleep has been poor; his appetite fair; his energy level is low and his concentration is poor.   His goal today is "to get to feeling and doing better."    A: Continue to monitor medication management and MD orders.  Safety checks completed every 15 minutes per protocol.  Offer support and encouragement as needed.  R: Patient is receptive to staff; his behavior is appropriate.

## 2017-12-14 NOTE — Progress Notes (Signed)
Patient has an ankle bracelet on that needs to be charged.  He does not have the charger with him and his probation officer needs to be contacted.

## 2017-12-14 NOTE — Progress Notes (Signed)
D   Pt has been in bed much of the evening   He has no complaints at this time    He is somewhat irritable and lethargic    Denies significant withdrawal at present time A   Verbal support given   Medications administered and effectiveness monitored    Q 15 min checks R   Pt is safe at present

## 2017-12-14 NOTE — B[facility] Group Notes (Signed)
LCSW Group Therapy Note  12/14/2017 9:30-10:30AM - 300 Hall, 10:30-11:30 - 400 Hall, 11:30-12:00 - 500 Hall  Type of Therapy and Topic:  Group Therapy: Anger Cues and Responses  Participation Level:  Did Not Attend   Description of Group:   In this group, patients learned how to recognize the physical, cognitive, emotional, and behavioral responses they have to anger-provoking situations.  They identified a recent time they became angry and how they reacted.  They analyzed how their reaction was possibly beneficial and how it was possibly unhelpful.  The group discussed a variety of healthier coping skills that could help with such a situation in the future.  Deep breathing was practiced briefly.  Therapeutic Goals: 1. Patients will remember their last incident of anger and how they felt emotionally and physically, what their thoughts were at the time, and how they behaved. 2. Patients will identify how their behavior at that time worked for them, as well as how it worked against them. 3. Patients will explore possible new behaviors to use in future anger situations. 4. Patients will learn that anger itself is normal and cannot be eliminated, and that healthier reactions can assist with resolving conflict rather than worsening situations.  Summary of Patient Progress:  N/A  Therapeutic Modalities:   Cognitive Behavioral Therapy  Lynnell ChadMareida J Grossman-Orr  12/14/2017 8:25 AM

## 2017-12-15 LAB — BASIC METABOLIC PANEL
Anion gap: 14 (ref 5–15)
BUN: 8 mg/dL (ref 6–20)
CO2: 23 mmol/L (ref 22–32)
CREATININE: 0.79 mg/dL (ref 0.61–1.24)
Calcium: 9.2 mg/dL (ref 8.9–10.3)
Chloride: 102 mmol/L (ref 101–111)
GFR calc Af Amer: >60 mL/min (ref >60)
GLUCOSE: 137 mg/dL — AB (ref 65–99)
POTASSIUM: 3.4 mmol/L — AB (ref 3.5–5.1)
SODIUM: 139 mmol/L (ref 135–145)

## 2017-12-15 MED ORDER — QUETIAPINE FUMARATE 50 MG PO TABS
50.0000 mg | ORAL_TABLET | Freq: Every evening | ORAL | Status: DC | PRN
  Administered 2017-12-15: 50 mg via ORAL
  Filled 2017-12-15: qty 1

## 2017-12-15 MED ORDER — GABAPENTIN 300 MG PO CAPS
300.0000 mg | ORAL_CAPSULE | Freq: Three times a day (TID) | ORAL | Status: DC
  Administered 2017-12-15 – 2017-12-19 (×12): 300 mg via ORAL
  Filled 2017-12-15: qty 1
  Filled 2017-12-15 (×2): qty 42
  Filled 2017-12-15 (×3): qty 1
  Filled 2017-12-15 (×2): qty 42
  Filled 2017-12-15 (×3): qty 1
  Filled 2017-12-15 (×2): qty 42
  Filled 2017-12-15 (×7): qty 1

## 2017-12-15 MED ORDER — QUETIAPINE FUMARATE 25 MG PO TABS
25.0000 mg | ORAL_TABLET | Freq: Two times a day (BID) | ORAL | Status: DC
  Administered 2017-12-15 – 2017-12-17 (×5): 25 mg via ORAL
  Filled 2017-12-15 (×8): qty 1

## 2017-12-15 NOTE — [facility] Group Notes (Addendum)
[facility] LCSW Group Therapy Note  Date/Time:  12/15/2017  11:00AM-12:00PM  Type of Therapy and Topic:  Group Therapy:  Music and Mood  Participation Level:  Good  Description of Group: In this process group, members listened to a variety of genres of music and identified that different types of music evoke different responses.  Patients were encouraged to identify music that was soothing for them and music that was energizing for them.  Patients discussed how this knowledge can help with wellness and recovery in various ways including managing depression and anxiety as well as encouraging healthy sleep habits.    Therapeutic Goals: 1. Patients will explore the impact of different varieties of music on mood 2. Patients will verbalize the thoughts they have when listening to different types of music 3. Patients will identify music that is soothing to them as well as music that is energizing to them 4. Patients will discuss how to use this knowledge to assist in maintaining wellness and recovery 5. Patients will explore the use of music as a coping skill  Summary of Patient Progress:  At the beginning of group, patient expressed that he felt "down" and at the end of group said that music helps him a lot, and he was now feeling better, "less down and more so-so."  Therapeutic Modalities: Solution Focused Brief Therapy Activity   Riley MantleMareida Grossman-Orr, LCSW
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
## 2017-12-15 NOTE — Progress Notes (Signed)
D.  Pt pleasant on approach, complaint of anxiety and withdrawal continued.  Pt was positive for evening AA group, observed engaged in appropriate interaction with peers on the unit.  Pt denies SI/HI/AVH at this time.  A. Support and encouragement offered, medication given as ordered.  R.  Pt remains safe on the unit, will continue to monitor.

## 2017-12-15 NOTE — Progress Notes (Signed)
DAR NOTE: Patient presents with anxious affect and depressed mood.  Pt has been visible in the milieu, not interacting much with peer. Pt got irritable about his medication being change this morning, stated he did not sleep well, kept turning and tossing. Pt also reported being passive SI, contracted for safety. Denies pain, auditory and visual hallucinations.  Rates depression at 8, hopelessness at 8, and anxiety at 9.  Maintained on routine safety checks.  Medications given as prescribed.  Support and encouragement offered as needed.  Attended group and participated.  States goal for today is " recover and get some help to get well."  Patient observed socializing with peers in the dayroom.  Offered no complaint.

## 2017-12-15 NOTE — Progress Notes (Signed)
Patient did attend the evening speaker AA meeting.  

## 2017-12-15 NOTE — Progress Notes (Addendum)
[facility] MD Progress Note  12/15/2017 11:34 AM Riley [name]  MRN:  [id number]   Subjective:  Patient reports that he is very upset because the other doctor changed his medications. He reports not sleeping well and has been awake since 3 am, he feels irritated today, "I'm just not in a good mood right now." he reports passive SI, rates his depression at 8/10 and his anxiety at 8/10. He reports minimal withdrawal symptoms. He likes th Campral and feels his cravings have significantly decreased.   Objective: Patient's chart and findings reviewed and discussed with treatment team. Patient presents in the day room and has been seen interacting appropriately. He does appear irritated. I will increase the Seroquel back to 50 mg QHS and 25 mg BID for sleep, mood, and agitation. Will continue all other medications.  Principal Problem: MDD (major depressive disorder), severe ([place]) Diagnosis:   Patient Active Problem List   Diagnosis Date Noted  . MDD (major depressive disorder), severe ([place]) [F32.2] 12/13/2017  . Alcoholism ([place]) [F10.20] 12/12/2017  . Hypokalemia [E87.6] 12/12/2017  . Suicidal ideations [R45.851] 12/12/2017  . Hypoglycemia due to insulin [E16.0, T38.3X5A] 12/12/2017  . Alcohol abuse with intoxication ([place]) [F10.129] 12/12/2017  . Hypoglycemia [E16.2] 12/12/2017  . Insulin overdose [T38.3X1A]    Total Time spent with patient: 30 minutes  Past Psychiatric History: See H&P  Past Medical History:  Past Medical History:  Diagnosis Date  . Alcoholism ([place])    History reviewed. No pertinent surgical history. Family History:  Family History  Family history unknown: Yes   Family Psychiatric  History: See H&P Social History:  Social History   Substance and Sexual Activity  Alcohol Use Yes   Comment: Pt stated "I drink  1/2 gal of 100 proof every day"     Social History   Substance and Sexual Activity  Drug Use No    Social History   Socioeconomic History  . Marital  status: Single    Spouse name: None  . Number of children: None  . Years of education: None  . Highest education level: None  Social Needs  . Financial resource strain: None  . Food insecurity - worry: None  . Food insecurity - inability: None  . Transportation needs - medical: None  . Transportation needs - non-medical: None  Occupational History  . None  Tobacco Use  . Smoking status: Current Every Day Smoker    Packs/day: 2.00    Types: Cigarettes  . Smokeless tobacco: Never Used  Substance and Sexual Activity  . Alcohol use: Yes    Comment: Pt stated "I drink  1/2 gal of 100 proof every day"  . Drug use: No  . Sexual activity: Not Currently  Other Topics Concern  . None  Social History Narrative  . None   Additional Social History:        Sleep: Fair  Appetite:  Good  Current Medications: Current Facility-Administered Medications  Medication Dose Route Frequency Provider Last Rate Last Dose  . bisacodyl (DULCOLAX) EC tablet 5 mg  5 mg Oral Daily PRN [name], [name], NP      . chlordiazePOXIDE (LIBRIUM) capsule 25 mg  25 mg Oral Q6H PRN Money, Lowry Ram, FNP      . chlordiazePOXIDE (LIBRIUM) capsule 25 mg  25 mg Oral QID Money, Lowry Ram, FNP   25 mg at 12/15/17 0820   Followed by  . chlordiazePOXIDE (LIBRIUM) capsule 25 mg  25 mg Oral TID Money, Lowry Ram,  FNP   25 mg at 12/15/17 1126   Followed by  . [START ON 12/16/2017] chlordiazePOXIDE (LIBRIUM) capsule 25 mg  25 mg Oral BH-qamhs Money, Lowry Ram, FNP       Followed by  . [START ON 12/18/2017] chlordiazePOXIDE (LIBRIUM) capsule 25 mg  25 mg Oral Daily Money, Lowry Ram, FNP      . citalopram (CELEXA) tablet 10 mg  10 mg Oral Daily Money, Lowry Ram, FNP   10 mg at 12/15/17 2119  . feeding supplement (ENSURE ENLIVE) (ENSURE ENLIVE) liquid 237 mL  237 mL Oral BID BM [name], [name], NP   237 mL at 12/15/17 0824  . gabapentin (NEURONTIN) capsule 100 mg  100 mg Oral TID Money, Lowry Ram, FNP   100 mg at 12/15/17 1126  .  hydrOXYzine (ATARAX/VISTARIL) tablet 25 mg  25 mg Oral Q6H PRN Lindon Romp A, NP   25 mg at 12/14/17 2131  . loperamide (IMODIUM) capsule 2-4 mg  2-4 mg Oral PRN Lindon Romp A, NP      . multivitamin with minerals tablet 1 tablet  1 tablet Oral Daily Lindon Romp A, NP   1 tablet at 12/15/17 [phone]  . nicotine (NICODERM CQ - dosed in mg/24 hours) patch 21 mg  21 mg Transdermal Daily Haadi Santellan, Myer Peer, MD   21 mg at 12/15/17 0814  . ondansetron (ZOFRAN-ODT) disintegrating tablet 4 mg  4 mg Oral Q6H PRN Lindon Romp A, NP      . potassium chloride (K-DUR,KLOR-CON) CR tablet 10 mEq  10 mEq Oral BID Kainen Struckman, Myer Peer, MD   10 mEq at 12/15/17 4818  . QUEtiapine (SEROQUEL) tablet 25 mg  25 mg Oral BID Money, Lowry Ram, FNP   25 mg at 12/15/17 0934  . QUEtiapine (SEROQUEL) tablet 50 mg  50 mg Oral QHS PRN Money, Lowry Ram, FNP      . thiamine (B-1) injection 100 mg  100 mg Intramuscular Once Money, Darnelle Maffucci B, FNP      . thiamine (VITAMIN B-1) tablet 100 mg  100 mg Oral Daily Money, Lowry Ram, FNP   100 mg at 12/15/17 5631    Lab Results:  Results for orders placed or performed during the hospital encounter of 12/13/17 (from the past 48 hour(s))  Basic metabolic panel     Status: Abnormal   Collection Time: 12/15/17  7:28 AM  Result Value Ref Range   Sodium 139 135 - 145 mmol/L   Potassium 3.4 (L) 3.5 - 5.1 mmol/L   Chloride 102 101 - 111 mmol/L   CO2 23 22 - 32 mmol/L   Glucose, Bld 137 (H) 65 - 99 mg/dL   BUN 8 6 - 20 mg/dL   Creatinine, Ser 0.79 0.61 - 1.24 mg/dL   Calcium 9.2 8.9 - 10.3 mg/dL   GFR calc non Af Amer >60 >60 mL/min   GFR calc Af Amer >60 >60 mL/min    Comment: (NOTE) The eGFR has been calculated using the CKD EPI equation. This calculation has not been validated in all clinical situations. eGFR's persistently <60 mL/min signify possible Chronic Kidney Disease.    Anion gap 14 5 - 15    Comment: Performed at [facility], [place] [address], [place], [place]  [address]    Blood Alcohol level:  Lab Results  Component Value Date   ETH [facility] [employer]) 12/12/2017   ETH 294 (H) 63/78/5885    Metabolic Disorder Labs: No results found for: HGBA1C, MPG  No results found for: PROLACTIN No results found for: CHOL, TRIG, HDL, CHOLHDL, VLDL, LDLCALC  Physical Findings: AIMS: Facial and Oral Movements Muscles of Facial Expression: None, normal Lips and Perioral Area: None, normal Jaw: None, normal Tongue: None, normal,Extremity Movements Upper (arms, wrists, hands, fingers): None, normal Lower (legs, knees, ankles, toes): None, normal, Trunk Movements Neck, shoulders, hips: None, normal, Overall Severity Severity of abnormal movements (highest score from questions above): None, normal Incapacitation due to abnormal movements: None, normal Patient's awareness of abnormal movements (rate only patient's report): No Awareness, Dental Status Current problems with teeth and/or dentures?: No Does patient usually wear dentures?: No  CIWA:  CIWA-Ar Total: 2 COWS:     Musculoskeletal: Strength & Muscle Tone: within normal limits Gait & Station: normal Patient leans: N/A  Psychiatric Specialty Exam: Physical Exam  Nursing note and vitals reviewed. Constitutional: He is oriented to person, place, and time. He appears well-developed and well-nourished.  Respiratory: Effort normal.  Musculoskeletal: Normal range of motion.  Neurological: He is alert and oriented to person, place, and time.  Skin: Skin is warm.    Review of Systems  Constitutional: Negative.   HENT: Negative.   Eyes: Negative.   Respiratory: Negative.   Cardiovascular: Negative.   Gastrointestinal: Negative.   Genitourinary: Negative.   Musculoskeletal: Negative.   Skin: Negative.   Neurological: Negative.   Endo/Heme/Allergies: Negative.   Psychiatric/Behavioral: Positive for depression, substance abuse and suicidal ideas. Negative for hallucinations. The patient is nervous/anxious and  has insomnia.     Blood pressure 132/88, pulse (!) 113, temperature 98.5 F (36.9 C), resp. rate 16, height '5\' 7"'  (1.702 m), weight 68.3 kg (150 lb 8 oz).Body mass index is 23.57 kg/m.  General Appearance: Casual  Eye Contact:  Good  Speech:  Clear and Coherent and Normal Rate  Volume:  Increased  Mood:  Irritable  Affect:  Flat  Thought Process:  Goal Directed and Descriptions of Associations: Intact  Orientation:  Full (Time, Place, and Person)  Thought Content:  WDL  Suicidal Thoughts:  Yes.  without intent/plan  Homicidal Thoughts:  No  Memory:  Immediate;   Good Recent;   Good Remote;   Good  Judgement:  Fair  Insight:  Fair  Psychomotor Activity:  Normal  Concentration:  Concentration: Good and Attention Span: Good  Recall:  Good  Fund of Knowledge:  Good  Language:  Good  Akathisia:  No  Handed:  Right  AIMS (if indicated):     Assets:  Communication Skills Desire for Improvement Financial Resources/Insurance Housing Physical Health Social Support Transportation  ADL's:  Intact  Cognition:  WNL  Sleep:  Number of Hours: 5.25   Problems Addressed: MDD severe Alcoholism  Treatment Plan Summary: Daily contact with patient to assess and evaluate symptoms and progress in treatment, Medication management and Plan is to:  -Continue Campral 666 mg PO TID for cravings -Increase Seroquel 50 mg PO QHS for mood stability -Start Seroquel 25 mg PO BID for agitation and mood stability -Increase Gabapentin 300 mg PO TID for withdrawal symptoms  -Continue Librium Detox Protocol -Continue Celexa 10 mg PO Daily for mood stability -Encourage group therapy participation  Lewis Shock, FNP 12/15/2017, 11:34 AM   Agree with NP Progress Note

## 2017-12-15 NOTE — [facility] Suicide Risk Assessment (Signed)
[facility] INPATIENT:  Family/Significant Other Suicide Prevention Education  Suicide Prevention Education:  Patient Refusal for Family/Significant Other Suicide Prevention Education: The patient Riley SermonRobert G [name] has refused to provide written consent for family/significant other to be provided Family/Significant Other Suicide Prevention Education during admission and/or prior to discharge.  Physician notified.  SUICIDE PREVENTION EDUCATION BROCHURE WAS REVIEWED IN DETAIL WITH PATIENT AND GIVEN TO HIM.  HE HAS NO ACCESS TO FIREARMS.  HE EXPRESSED UNDERSTANDING OF THE INFORMATION.  [name] 12/15/2017, 2:00 PM

## 2017-12-15 NOTE — Progress Notes (Signed)
NUTRITION ASSESSMENT  Pt identified as at risk on the Malnutrition Screen Tool  INTERVENTION: 1. Educated patient on the importance of nutrition and encouraged intake of food and beverages. 2. Discussed weight goals. 3. Supplements: Continue Ensure Enlive po BID, each supplement provides 350 kcal and 20 grams of protein  NUTRITION DIAGNOSIS: Unintentional weight loss related to sub-optimal intake as evidenced by pt report.   Goal: Pt to meet >/= 90% of their estimated nutrition needs.  Monitor:  PO intake  Assessment:  Pt admitted with ETOH abuse. Pt reports drinking more than eating. Per chart, pt was consuming 75-100% [facility]. Pt reports 30 lb weight loss, this is not reflected in weight records. Suspect some weights are stated. Pt has been ordered Ensure supplements, will continue order.   Height: Ht Readings from Last 1 Encounters:  12/13/17 5\' 7"  (1.702 m)    Weight: Wt Readings from Last 1 Encounters:  12/13/17 150 lb 8 oz (68.3 kg)    Weight Hx: Wt Readings from Last 10 Encounters:  12/13/17 150 lb 8 oz (68.3 kg)  12/12/17 130 lb (59 kg)  11/30/17 175 lb (79.4 kg)  10/25/17 175 lb (79.4 kg)  10/24/17 173 lb (78.5 kg)  04/22/17 150 lb (68 kg)  10/24/16 160 lb (72.6 kg)  09/17/16 160 lb (72.6 kg)  01/16/16 160 lb (72.6 kg)    BMI:  Body mass index is 23.57 kg/m. Pt meets criteria for normal based on current BMI.  Estimated Nutritional Needs: Kcal: 25-30 kcal/kg Protein: > 1 gram protein/kg Fluid: 1 ml/kcal  Diet Order: No diet orders on file Pt is also offered choice of unit snacks mid-morning and mid-afternoon.  Pt is eating as desired.   Lab results and medications reviewed.   Tilda FrancoLindsey Zacari Radick, MS, RD, LDN [facility] Clinical Dietitian Pager: [phone] After Hours Pager: [phone]

## 2017-12-16 MED ORDER — HYDROXYZINE HCL 50 MG PO TABS
50.0000 mg | ORAL_TABLET | Freq: Four times a day (QID) | ORAL | Status: AC | PRN
  Administered 2017-12-16: 50 mg via ORAL
  Filled 2017-12-16: qty 1

## 2017-12-16 MED ORDER — QUETIAPINE FUMARATE 100 MG PO TABS
100.0000 mg | ORAL_TABLET | Freq: Every evening | ORAL | Status: DC | PRN
  Administered 2017-12-16: 100 mg via ORAL
  Filled 2017-12-16: qty 1

## 2017-12-16 MED ORDER — NICOTINE POLACRILEX 2 MG MT GUM
2.0000 mg | CHEWING_GUM | OROMUCOSAL | Status: DC | PRN
  Administered 2017-12-16 – 2017-12-18 (×3): 2 mg via ORAL
  Filled 2017-12-16: qty 1

## 2017-12-16 NOTE — Progress Notes (Addendum)
Patient ID: Riley SermonRobert G [name], male   DOB: 1991/09/27, 27 y.o.   MRN: [id number]  Pt currently presents with a flat affect and cooperative behavior. Pt reports that he woke up early this morning and has been sleeping on and off today. Concerned about being able to sleep tonight. Pt mildly dizzy when standing up during assessment tonight. Pt forward little to writer, interaction superficial.  Pt provided with medications per providers orders. Pt's labs and vitals were monitored throughout the night. Pt given a 1:1 about emotional and mental status. Pt supported and encouraged to express concerns and questions. Pt educated on medications.  Pt's safety ensured with 15 minute and environmental checks. Pt was passive SI this morning during group per RN handoff. Pt currently denies SI/HI and A/V hallucinations. Pt verbally agrees to seek staff if SI/HI or A/VH occurs and to consult with staff before acting on any harmful thoughts. Will continue POC.

## 2017-12-16 NOTE — Progress Notes (Signed)
Pt did not attend AA group this evening.  

## 2017-12-16 NOTE — Progress Notes (Signed)
Adult Psychoeducational Group Note  Date:  12/16/2017 Time:  10:08 AM  Group Topic/Focus:  Wellness Toolbox:   The focus of this group is to discuss various aspects of wellness, balancing those aspects and exploring ways to increase the ability to experience wellness.  Patients will create a wellness toolbox for use upon discharge.  Participation Level:  Active  Participation Quality:  Appropriate  Affect:  Appropriate  Cognitive:  Alert  Insight: Good  Engagement in Group:  Engaged  Modes of Intervention:  Discussion and Education  Additional Comments:  Pt was able to attend group this morning and communicated his desire to get in touch with his [employer] fast streamerarole Officer as soon as possible.  Ilisa Hayworth E 12/16/2017, 10:08 AM

## 2017-12-16 NOTE — Tx Team (Signed)
Interdisciplinary Treatment and Diagnostic Plan Update  12/16/2017 Time of Session: 0830AM Riley SermonRobert G [name] MRN: [id number]  Principal Diagnosis: MDD (major depressive disorder), severe ([facility])  Secondary Diagnoses: Principal Problem:   MDD (major depressive disorder), severe ([facility]) Active Problems:   Alcoholism ([facility])   Current Medications:  Current Facility-Administered Medications  Medication Dose Route Frequency Provider Last Rate Last Dose  . bisacodyl (DULCOLAX) EC tablet 5 mg  5 mg Oral Daily PRN [name], [name], NP      . chlordiazePOXIDE (LIBRIUM) capsule 25 mg  25 mg Oral Q6H PRN Money, [name], FNP   25 mg at 12/15/17 2111  . chlordiazePOXIDE (LIBRIUM) capsule 25 mg  25 mg Oral BH-qamhs Money, [name], FNP       Followed by  . [START ON 12/18/2017] chlordiazePOXIDE (LIBRIUM) capsule 25 mg  25 mg Oral Daily Money, [name], FNP      . citalopram (CELEXA) tablet 10 mg  10 mg Oral Daily Money, [name], FNP   10 mg at 12/16/17 0805  . feeding supplement (ENSURE ENLIVE) (ENSURE ENLIVE) liquid 237 mL  237 mL Oral BID BM [name], [name], NP   237 mL at 12/16/17 0808  . gabapentin (NEURONTIN) capsule 300 mg  300 mg Oral TID Money, [name], FNP   300 mg at 12/16/17 0805  . hydrOXYzine (ATARAX/VISTARIL) tablet 25 mg  25 mg Oral Q6H PRN [name], Jason A, NP   25 mg at 12/15/17 2111  . loperamide (IMODIUM) capsule 2-4 mg  2-4 mg Oral PRN [name], Jason A, NP      . multivitamin with minerals tablet 1 tablet  1 tablet Oral Daily [name], Jason A, NP   1 tablet at 12/16/17 0805  . nicotine (NICODERM CQ - dosed in mg/24 hours) patch 21 mg  21 mg Transdermal Daily Cobos, Rockey SituFernando A, MD   21 mg at 12/15/17 [id number]  . ondansetron (ZOFRAN-ODT) disintegrating tablet 4 mg  4 mg Oral Q6H PRN [name], Jason A, NP      . QUEtiapine (SEROQUEL) tablet 25 mg  25 mg Oral BID Money, [name], FNP   25 mg at 12/16/17 0805  . QUEtiapine (SEROQUEL) tablet 50 mg  50 mg Oral QHS PRN Money, [name], FNP   50 mg at  12/15/17 2111  . thiamine (B-1) injection 100 mg  100 mg Intramuscular Once Money, [name], FNP      . thiamine (VITAMIN B-1) tablet 100 mg  100 mg Oral Daily Money, [name], FNP   100 mg at 12/16/17 [id number]   PTA Medications: Medications Prior to Admission  Medication Sig Dispense Refill Last Dose  . acetaminophen (TYLENOL) 325 MG tablet Take 2 tablets (650 mg total) by mouth every 6 (six) hours as needed for mild pain or headache (or Fever >/= 101). 30 tablet 1   . folic acid (FOLVITE) 1 MG tablet Take 1 tablet (1 mg total) by mouth daily. 30 tablet 1   . magnesium oxide (MAGOX 400) 400 (241.3 Mg) MG tablet Take 1 tablet (400 mg total) by mouth daily. 30 tablet 1   . Multiple Vitamin (MULTIVITAMIN WITH MINERALS) TABS tablet Take 1 tablet by mouth daily. 30 tablet 1   . potassium chloride SA (K-DUR,KLOR-CON) 20 MEQ tablet Take 1 tablet (20 mEq total) by mouth daily. 30 tablet 0   . thiamine 100 MG tablet Take 1 tablet (100 mg total) by mouth daily. 30 tablet 1   . traZODone (DESYREL) 100  MG tablet Take 100 mg by mouth at bedtime as needed for sleep.   Past Month at Unknown time    Patient Stressors: Financial difficulties Legal issue Occupational concerns  Patient Strengths: Wellsite geologist fund of knowledge Physical Health Work skills  Treatment Modalities: Medication Management, Group therapy, Case management,  1 to 1 session with clinician, Psychoeducation, Recreational therapy.   Physician Treatment Plan for Primary Diagnosis: MDD (major depressive disorder), severe (H[facility]) Long Term Goal(s): Improvement in symptoms so as ready for discharge Improvement in symptoms so as ready for discharge   Short Term Goals: Ability to disclose and discuss suicidal ideas Ability to demonstrate self-control will improve Ability to identify and develop effective coping behaviors will improve Compliance with prescribed medications will improve  Medication Management: Evaluate  patient's response, side effects, and tolerance of medication regimen.  Therapeutic Interventions: 1 to 1 sessions, Unit Group sessions and Medication administration.  Evaluation of Outcomes: Progressing  Physician Treatment Plan for Secondary Diagnosis: Principal Problem:   MDD (major depressive disorder), severe ([facility]) Active Problems:   Alcoholism ([facility])  Long Term Goal(s): Improvement in symptoms so as ready for discharge Improvement in symptoms so as ready for discharge   Short Term Goals: Ability to disclose and discuss suicidal ideas Ability to demonstrate self-control will improve Ability to identify and develop effective coping behaviors will improve Compliance with prescribed medications will improve     Medication Management: Evaluate patient's response, side effects, and tolerance of medication regimen.  Therapeutic Interventions: 1 to 1 sessions, Unit Group sessions and Medication administration.  Evaluation of Outcomes: Progressing   RN Treatment Plan for Primary Diagnosis: MDD (major depressive disorder), severe ([facility]) Long Term Goal(s): Knowledge of disease and therapeutic regimen to maintain health will improve  Short Term Goals: Ability to remain free from injury will improve, Ability to disclose and discuss suicidal ideas and Ability to identify and develop effective coping behaviors will improve  Medication Management: RN will administer medications as ordered by provider, will assess and evaluate patient's response and provide education to patient for prescribed medication. RN will report any adverse and/or side effects to prescribing provider.  Therapeutic Interventions: 1 on 1 counseling sessions, Psychoeducation, Medication administration, Evaluate responses to treatment, Monitor vital signs and CBGs as ordered, Perform/monitor CIWA, COWS, AIMS and Fall Risk screenings as ordered, Perform wound care treatments as ordered.  Evaluation of Outcomes:  Progressing   LCSW Treatment Plan for Primary Diagnosis: MDD (major depressive disorder), severe (HCC) Long Term Goal(s): Safe transition to appropriate next level of care at discharge, Engage patient in therapeutic group addressing interpersonal concerns.  Short Term Goals: Engage patient in aftercare planning with referrals and resources, Facilitate patient progression through stages of change regarding substance use diagnoses and concerns and Identify triggers associated with mental health/substance abuse issues  Therapeutic Interventions: Assess for all discharge needs, 1 to 1 time with Social worker, Explore available resources and support systems, Assess for adequacy in community support network, Educate family and significant other(s) on suicide prevention, Complete Psychosocial Assessment, Interpersonal group therapy.  Evaluation of Outcomes: Progressing   Progress in Treatment: Attending groups: Yes. Participating in groups: Yes. Taking medication as prescribed: Yes. Toleration medication: Yes. Family/Significant other contact made:SPE completed with pt; pt declined to consent to family contact.  Patient understands diagnosis: Yes. Discussing patient identified problems/goals with staff: Yes. Medical problems stabilized or resolved: Yes. Denies suicidal/homicidal ideation: Yes. Issues/concerns per patient self-inventory: No. Other: n/a   New problem(s) identified: No, Describe:  n/a  New Short Term/Long Term Goal(s): detox, medication management for mood stabilization; elimination of SI thoughts; development of comprehensive mental wellness/sobriety plan.   Patient Goal: "to get help for my alcohol abuse and find hope."   Discharge Plan or Barriers: CSW assessing. Pt interested in residential treatment; however he is registered sex offender and has ankle bracelet. [facility] pamphlet and AA/NA information provided to pt.   Reason for Continuation of Hospitalization:  Depression Medication stabilization Suicidal ideation Withdrawal symptoms  Estimated Length of Stay: Wed, 12/18/17  Attendees: Patient: Riley [name] 12/16/2017 8:49 AM  Physician: Dr. Jama Flavors MD 12/16/2017 8:49 AM  Nursing: Erskine Squibb RN 12/16/2017 8:49 AM  RN Care Manager: Onnie Boer CM 12/16/2017 8:49 AM  Social Worker: Trula Slade, LCSW 12/16/2017 8:49 AM  Recreational Therapist: x 12/16/2017 8:49 AM  Other: Armandina Stammer NP; Reola Calkins NP; Hillery Jacks NP 12/16/2017 8:49 AM  Other:  12/16/2017 8:49 AM  Other: 12/16/2017 8:49 AM    Scribe for Treatment Team: Ledell Peoples Smart, LCSW 12/16/2017 8:49 AM

## 2017-12-16 NOTE — [facility] Group Notes (Signed)
LCSW Group Therapy Note   12/16/2017 1:15pm   Type of Therapy and Topic:  Group Therapy:  Overcoming Obstacles   Participation Level:  Active   Description of Group:    In this group patients will be encouraged to explore what they see as obstacles to their own wellness and recovery. They will be guided to discuss their thoughts, feelings, and behaviors related to these obstacles. The group will process together ways to cope with barriers, with attention given to specific choices patients can make. Each patient will be challenged to identify changes they are motivated to make in order to overcome their obstacles. This group will be process-oriented, with patients participating in exploration of their own experiences as well as giving and receiving support and challenge from other group members.   Therapeutic Goals: 1. Patient will identify personal and current obstacles as they relate to admission. 2. Patient will identify barriers that currently interfere with their wellness or overcoming obstacles.  3. Patient will identify feelings, thought process and behaviors related to these barriers. 4. Patient will identify two changes they are willing to make to overcome these obstacles:      Summary of Patient Progress   Riley SouRobert aka "Riley [name]" was attentive and engaged during today's processing group. He shared that his main obstacles involve "dealing with suicidal thoughts and getting into rehab." Riley [name] is interested in treatment and acknowledged that his only option [facility]. He processed his feelings about entering rebab with others in the group setting and continues to show progress in the group setting with improving insight.    Therapeutic Modalities:   Cognitive Behavioral Therapy Solution Focused Therapy Motivational Interviewing Relapse Prevention Therapy  Ledell PeoplesHeather N Smart, LCSW 12/16/2017 3:14 PM

## 2017-12-16 NOTE — Progress Notes (Signed)
DAR NOTE: Pt present with flat affect and depressed mood in the unit. Pt has been visible in the dayroom with peers. Pt stated he feeling better today, happy with his medication as scheduled. Pt brightened as the day progresses. Pt denies physical pain, took all his meds as scheduled. As per self inventory, pt had a poor night sleep, fair appetite, normal energy, and good concentration. Pt rate depression at 08, hopeless ness at 08, and anxiety at 09. Pt's safety ensured with 15 minute and environmental checks. Pt currently denies SI/HI and A/V hallucinations. Pt verbally agrees to seek staff if SI/HI or A/VH occurs and to consult with staff before acting on these thoughts. Will continue POC.

## 2017-12-16 NOTE — Progress Notes (Addendum)
[facility] MD Progress Note  12/16/2017 1:41 PM Riley [name]  MRN:  [id number]   Subjective:  Patient states that he is still not sleeping good. "I told you what I needed and it's 150 mg of Seroquel at night and 50 mg twice a day." He reports intermittent passive SI. He denies any HI/AVH and contracts for safety. He states that he de[pression is "off and on" and he has chronic anxiety. He reports being told by the CSW that he will be going to [facility].    Objective: Patient's chart and findings reviewed and discussed with treatment team. Patient is in the day room attending group. He has been interacting appropriately. He reports agitatio, but has not shown any disruptive behaviors so far. Will increase the Seroquel 100 mg QHS and continue the 25 mg BID. Will discuss with CSW about [facility].  Principal Problem: MDD (major depressive disorder), severe ([place]) Diagnosis:   Patient Active Problem List   Diagnosis Date Noted  . MDD (major depressive disorder), severe ([place]) [F32.2] 12/13/2017  . Alcoholism ([place]) [F10.20] 12/12/2017  . Hypokalemia [E87.6] 12/12/2017  . Suicidal ideations [R45.851] 12/12/2017  . Hypoglycemia due to insulin [E16.0, T38.3X5A] 12/12/2017  . Alcohol abuse with intoxication ([place]) [F10.129] 12/12/2017  . Hypoglycemia [E16.2] 12/12/2017  . Insulin overdose [T38.3X1A]    Total Time spent with patient: 30 minutes  Past Psychiatric History: See H&P  Past Medical History:  Past Medical History:  Diagnosis Date  . Alcoholism ([place])    History reviewed. No pertinent surgical history. Family History:  Family History  Family history unknown: Yes   Family Psychiatric  History: See H&P Social History:  Social History   Substance and Sexual Activity  Alcohol Use Yes   Comment: Pt stated "I drink  1/2 gal of 100 proof every day"     Social History   Substance and Sexual Activity  Drug Use No    Social History   Socioeconomic History  . Marital status:  Single    Spouse name: None  . Number of children: None  . Years of education: None  . Highest education level: None  Social Needs  . Financial resource strain: None  . Food insecurity - worry: None  . Food insecurity - inability: None  . Transportation needs - medical: None  . Transportation needs - non-medical: None  Occupational History  . None  Tobacco Use  . Smoking status: Current Every Day Smoker    Packs/day: 2.00    Types: Cigarettes  . Smokeless tobacco: Never Used  Substance and Sexual Activity  . Alcohol use: Yes    Comment: Pt stated "I drink  1/2 gal of 100 proof every day"  . Drug use: No  . Sexual activity: Not Currently  Other Topics Concern  . None  Social History Narrative  . None   Additional Social History:        Sleep: Fair  Appetite:  Good  Current Medications: Current Facility-Administered Medications  Medication Dose Route Frequency Provider Last Rate Last Dose  . bisacodyl (DULCOLAX) EC tablet 5 mg  5 mg Oral Daily PRN [name], [name], NP      . chlordiazePOXIDE (LIBRIUM) capsule 25 mg  25 mg Oral Q6H PRN Money, Lowry Ram, FNP   25 mg at 12/15/17 2111  . chlordiazePOXIDE (LIBRIUM) capsule 25 mg  25 mg Oral BH-qamhs Money, Lowry Ram, FNP       Followed by  . [START ON 12/18/2017] chlordiazePOXIDE (LIBRIUM) capsule  25 mg  25 mg Oral Daily Money, Lowry Ram, Sharon      . citalopram (CELEXA) tablet 10 mg  10 mg Oral Daily Money, Lowry Ram, FNP   10 mg at 12/16/17 0805  . feeding supplement (ENSURE ENLIVE) (ENSURE ENLIVE) liquid 237 mL  237 mL Oral BID BM [name], [name], NP   237 mL at 12/16/17 0808  . gabapentin (NEURONTIN) capsule 300 mg  300 mg Oral TID Money, Lowry Ram, FNP   300 mg at 12/16/17 0805  . hydrOXYzine (ATARAX/VISTARIL) tablet 50 mg  50 mg Oral Q6H PRN Money, Lowry Ram, FNP      . loperamide (IMODIUM) capsule 2-4 mg  2-4 mg Oral PRN Lindon Romp A, NP      . multivitamin with minerals tablet 1 tablet  1 tablet Oral Daily Lindon Romp A,  NP   1 tablet at 12/16/17 0805  . nicotine polacrilex (NICORETTE) gum 2 mg  2 mg Oral PRN Money, Lowry Ram, FNP   2 mg at 12/16/17 0926  . ondansetron (ZOFRAN-ODT) disintegrating tablet 4 mg  4 mg Oral Q6H PRN Lindon Romp A, NP      . QUEtiapine (SEROQUEL) tablet 100 mg  100 mg Oral QHS PRN Money, Lowry Ram, FNP      . QUEtiapine (SEROQUEL) tablet 25 mg  25 mg Oral BID Money, Lowry Ram, FNP   25 mg at 12/16/17 0805  . thiamine (B-1) injection 100 mg  100 mg Intramuscular Once Money, Darnelle Maffucci B, FNP      . thiamine (VITAMIN B-1) tablet 100 mg  100 mg Oral Daily Money, Lowry Ram, FNP   100 mg at 12/16/17 1157    Lab Results:  Results for orders placed or performed during the hospital encounter of 12/13/17 (from the past 48 hour(s))  Basic metabolic panel     Status: Abnormal   Collection Time: 12/15/17  7:28 AM  Result Value Ref Range   Sodium 139 135 - 145 mmol/L   Potassium 3.4 (L) 3.5 - 5.1 mmol/L   Chloride 102 101 - 111 mmol/L   CO2 23 22 - 32 mmol/L   Glucose, Bld 137 (H) 65 - 99 mg/dL   BUN 8 6 - 20 mg/dL   Creatinine, Ser 0.79 0.61 - 1.24 mg/dL   Calcium 9.2 8.9 - 10.3 mg/dL   GFR calc non Af Amer >60 >60 mL/min   GFR calc Af Amer >60 >60 mL/min    Comment: (NOTE) The eGFR has been calculated using the CKD EPI equation. This calculation has not been validated in all clinical situations. eGFR's persistently <60 mL/min signify possible Chronic Kidney Disease.    Anion gap 14 5 - 15    Comment: Performed at [facility], [place] [address] [place], [place] [address]    Blood Alcohol level:  Lab Results  Component Value Date   ETH 345 ([place]) 12/12/2017   ETH 294 ([facility]) 55/97/4163    Metabolic Disorder Labs: No results found for: HGBA1C, MPG No results found for: PROLACTIN No results found for: CHOL, TRIG, HDL, CHOLHDL, VLDL, LDLCALC  Physical Findings: AIMS: Facial and Oral Movements Muscles of Facial Expression: None, normal Lips and Perioral Area: None,  normal Jaw: None, normal Tongue: None, normal,Extremity Movements Upper (arms, wrists, hands, fingers): None, normal Lower (legs, knees, ankles, toes): None, normal, Trunk Movements Neck, shoulders, hips: None, normal, Overall Severity Severity of abnormal movements (highest score from questions above): None, normal Incapacitation due to abnormal  movements: None, normal Patient's awareness of abnormal movements (rate only patient's report): No Awareness, Dental Status Current problems with teeth and/or dentures?: No Does patient usually wear dentures?: No  CIWA:  CIWA-Ar Total: 5 COWS:     Musculoskeletal: Strength & Muscle Tone: within normal limits Gait & Station: normal Patient leans: N/A  Psychiatric Specialty Exam: Physical Exam  Nursing note and vitals reviewed. Constitutional: He is oriented to person, place, and time. He appears well-developed and well-nourished.  Respiratory: Effort normal.  Musculoskeletal: Normal range of motion.  Neurological: He is alert and oriented to person, place, and time.  Skin: Skin is warm.    Review of Systems  Constitutional: Negative.   HENT: Negative.   Eyes: Negative.   Respiratory: Negative.   Cardiovascular: Negative.   Gastrointestinal: Negative.   Genitourinary: Negative.   Musculoskeletal: Negative.   Skin: Negative.   Neurological: Negative.   Endo/Heme/Allergies: Negative.   Psychiatric/Behavioral: Positive for depression, substance abuse and suicidal ideas. Negative for hallucinations. The patient is nervous/anxious and has insomnia.     Blood pressure 133/78, pulse (!) 103, temperature 98.2 F (36.8 C), temperature source Oral, resp. rate 16, height '5\' 7"'  (1.702 m), weight 68.3 kg (150 lb 8 oz).Body mass index is 23.57 kg/m.  General Appearance: Casual  Eye Contact:  Good  Speech:  Clear and Coherent and Normal Rate  Volume:  Increased  Mood:  Irritable  Affect:  Flat  Thought Process:  Goal Directed and  Descriptions of Associations: Intact  Orientation:  Full (Time, Place, and Person)  Thought Content:  WDL  Suicidal Thoughts:  Yes.  without intent/plan intermittent  Homicidal Thoughts:  No  Memory:  Immediate;   Good Recent;   Good Remote;   Good  Judgement:  Fair  Insight:  Fair  Psychomotor Activity:  Normal  Concentration:  Concentration: Good and Attention Span: Good  Recall:  Good  Fund of Knowledge:  Good  Language:  Good  Akathisia:  No  Handed:  Right  AIMS (if indicated):     Assets:  Communication Skills Desire for Improvement Financial Resources/Insurance Housing Physical Health Social Support Transportation  ADL's:  Intact  Cognition:  WNL  Sleep:  Number of Hours: 5.25   Problems Addressed: MDD severe Alcoholism  Treatment Plan Summary: Daily contact with patient to assess and evaluate symptoms and progress in treatment, Medication management and Plan is to:  -Continue Campral 666 mg PO TID for cravings -Increase Seroquel 100 mg PO QHS for mood stability -Continue Seroquel 25 mg PO BID for agitation and mood stability -Continue Gabapentin 300 mg PO TID for withdrawal symptoms  -Increase Vistaril 50 mg PO Q6H PRN fo ranxiety -Continue Librium Detox Protocol -Continue Celexa 10 mg PO Daily for mood stability -Encourage group therapy participation  Lewis Shock, FNP 12/16/2017, 1:41 PM   Agree with NP Progress Note

## 2017-12-16 NOTE — [facility] Group Notes (Signed)
Pt attended spiritual care group on grief and loss facilitated by chaplain Burnis KingfisherMatthew Geetika Laborde   Group opened with brief discussion and psycho-social ed around grief and loss in relationships and in relation to self - identifying life patterns, circumstances, changes that cause losses. Established group norm of speaking from own life experience. Group goal of establishing open and affirming space for members to share loss and experience with grief, normalize grief experience and provide psycho social education and grief support.  Riley [facility]uroobert [facility](Riley [name]) was present throughout group.  Engaged with group members and facilitator voluntarily.  Connected with another group member around loss of mother, normalizing feelings of shock and numbness.   Reported he had coped with grief in ways that were unhelpful for him - ETOH.  Described working to find more helpful coping mechanisms when he feels overwhelmed.   Spoke briefly of his history of incarceration and death of father at young age - consistent with other providers notes.

## 2017-12-17 MED ORDER — QUETIAPINE FUMARATE 50 MG PO TABS
150.0000 mg | ORAL_TABLET | Freq: Every day | ORAL | Status: DC
  Administered 2017-12-17 – 2017-12-18 (×2): 150 mg via ORAL
  Filled 2017-12-17: qty 42
  Filled 2017-12-17 (×2): qty 3
  Filled 2017-12-17: qty 42
  Filled 2017-12-17 (×2): qty 3

## 2017-12-17 MED ORDER — HYDROXYZINE HCL 50 MG PO TABS
50.0000 mg | ORAL_TABLET | Freq: Three times a day (TID) | ORAL | Status: DC | PRN
  Administered 2017-12-17 – 2017-12-19 (×6): 50 mg via ORAL
  Filled 2017-12-17 (×2): qty 1
  Filled 2017-12-17: qty 20
  Filled 2017-12-17 (×5): qty 1

## 2017-12-17 MED ORDER — QUETIAPINE FUMARATE 50 MG PO TABS
50.0000 mg | ORAL_TABLET | Freq: Two times a day (BID) | ORAL | Status: DC
  Administered 2017-12-17 – 2017-12-19 (×4): 50 mg via ORAL
  Filled 2017-12-17 (×3): qty 1
  Filled 2017-12-17: qty 28
  Filled 2017-12-17: qty 1
  Filled 2017-12-17: qty 28
  Filled 2017-12-17 (×2): qty 1
  Filled 2017-12-17 (×2): qty 28

## 2017-12-17 MED ORDER — ALUM & MAG HYDROXIDE-SIMETH 200-200-20 MG/5ML PO SUSP
30.0000 mL | Freq: Four times a day (QID) | ORAL | Status: DC | PRN
  Administered 2017-12-17: 30 mL via ORAL
  Filled 2017-12-17: qty 30

## 2017-12-17 MED ORDER — CITALOPRAM HYDROBROMIDE 20 MG PO TABS
20.0000 mg | ORAL_TABLET | Freq: Every day | ORAL | Status: DC
  Administered 2017-12-18 – 2017-12-19 (×2): 20 mg via ORAL
  Filled 2017-12-17: qty 1
  Filled 2017-12-17: qty 14
  Filled 2017-12-17: qty 1
  Filled 2017-12-17: qty 14
  Filled 2017-12-17: qty 1

## 2017-12-17 MED ORDER — ACETAMINOPHEN 325 MG PO TABS
650.0000 mg | ORAL_TABLET | Freq: Four times a day (QID) | ORAL | Status: DC | PRN
  Administered 2017-12-17: 650 mg via ORAL
  Filled 2017-12-17: qty 2

## 2017-12-17 NOTE — [facility] Group Notes (Signed)
[facility] [employer] Group Therapy 12/17/2017 1:15pm  Type of Therapy: Mental Health Association Presentation  Participation Level: Active  Participation Quality: Attentive  Affect: Appropriate  Cognitive: Oriented  Insight: Developing/Improving  Engagement in Therapy: Engaged  Modes of Intervention: Discussion, Education and Socialization  Summary of Progress/Problems: Mental Health Association (MHA) Speaker came to talk about his personal journey with mental health. The pt processed ways by which to relate to the speaker. MHA speaker provided handouts and educational information pertaining to groups and services offered by the MHA. Pt was engaged in speaker's presentation and was receptive to resources provided.    Charlton Boule N Smart, LCSW 12/17/2017 3:25 PM  

## 2017-12-17 NOTE — Progress Notes (Signed)
D:Pt verbally denied si this morning. He reported si thoughts on self inventory with no plan. Pt is superficial with his interaction and irritable at times. He rates depression as a 7 and anxiety as an 8 on 0-10 scale with 10 being the most.  A:Offered support, encouragement and 15 minute checks.  R:Safety maintained on the unit.

## 2017-12-17 NOTE — Progress Notes (Signed)
[facility] MD Progress Note  12/17/2017 1:11 PM FINIAN HELVEY  MRN:  [id number]   Subjective:  Patient reports that his sleep has improved and feels better today. He reports that his depression is still "pretty bad" but he denies any SI/HI/AVH and contracts for safety. He denies any withdrawal symptoms. He reports that he spoke with his parole officer yesterday and he told her he was here for substance abuse and depression and she was very supportive of his treatment options. He is looking forward to going to [facility] on Friday. He denies any SI/HI/AVh and contracts for safety.   Objective: Patient's chart and findings reviewed and discussed with treatment team. Patient presents in the day room attending group . He has been interacting with peers and staff appropriately. Will continue the gradual increase of the Seroquel to 150 mg QHS and 50 mg BID. He has not appeared drowsy or isolating. Due to severe depression will increase Celexa to 20 mg Daily as well.     Principal Problem: MDD (major depressive disorder), severe ([facility]) Diagnosis:   Patient Active Problem List   Diagnosis Date Noted  . MDD (major depressive disorder), severe ([facility]) [F32.2] 12/13/2017  . Alcoholism ([facility]) [F10.20] 12/12/2017  . Hypokalemia [E87.6] 12/12/2017  . Suicidal ideations [R45.851] 12/12/2017  . Hypoglycemia due to insulin [E16.0, T38.3X5A] 12/12/2017  . Alcohol abuse with intoxication ([facility]) [F10.129] 12/12/2017  . Hypoglycemia [E16.2] 12/12/2017  . Insulin overdose [T38.3X1A]    Total Time spent with patient: 30 minutes  Past Psychiatric History: See H&P  Past Medical History:  Past Medical History:  Diagnosis Date  . Alcoholism ([facility])    History reviewed. No pertinent surgical history. Family History:  Family History  Family history unknown: Yes   Family Psychiatric  History: See H&P Social History:  Social History   Substance and Sexual Activity  Alcohol Use Yes   Comment: Pt stated "I drink   1/2 gal of 100 proof every day"     Social History   Substance and Sexual Activity  Drug Use No    Social History   Socioeconomic History  . Marital status: Single    Spouse name: None  . Number of children: None  . Years of education: None  . Highest education level: None  Social Needs  . Financial resource strain: None  . Food insecurity - worry: None  . Food insecurity - inability: None  . Transportation needs - medical: None  . Transportation needs - non-medical: None  Occupational History  . None  Tobacco Use  . Smoking status: Current Every Day Smoker    Packs/day: 2.00    Types: Cigarettes  . Smokeless tobacco: Never Used  Substance and Sexual Activity  . Alcohol use: Yes    Comment: Pt stated "I drink  1/2 gal of 100 proof every day"  . Drug use: No  . Sexual activity: Not Currently  Other Topics Concern  . None  Social History Narrative  . None   Additional Social History:        Sleep: Good  Appetite:  Good  Current Medications: Current Facility-Administered Medications  Medication Dose Route Frequency Provider Last Rate Last Dose  . acetaminophen (TYLENOL) tablet 650 mg  650 mg Oral Q6H PRN Clebert Wenger, Gerlene Burdock, FNP      . bisacodyl (DULCOLAX) EC tablet 5 mg  5 mg Oral Daily PRN [name], [name], NP      . chlordiazePOXIDE (LIBRIUM) capsule 25 mg  25 mg  Oral Q6H PRN Hernando Reali, [name], FNP   25 mg at 12/15/17 2111  . [START ON 12/18/2017] chlordiazePOXIDE (LIBRIUM) capsule 25 mg  25 mg Oral Daily Zakariyah Freimark, [name], FNP      . [START ON 12/18/2017] citalopram (CELEXA) tablet 20 mg  20 mg Oral Daily Caren Garske, [name], FNP      . feeding supplement (ENSURE ENLIVE) (ENSURE ENLIVE) liquid 237 mL  237 mL Oral BID BM [name], [name], NP   237 mL at 12/17/17 0927  . gabapentin (NEURONTIN) capsule 300 mg  300 mg Oral TID Roni Scow, [name], FNP   300 mg at 12/17/17 1142  . hydrOXYzine (ATARAX/VISTARIL) tablet 50 mg  50 mg Oral TID PRN Sopheap Basic, [name], FNP   50 mg at 12/17/17  1142  . multivitamin with minerals tablet 1 tablet  1 tablet Oral Daily [name], Jason A, NP   1 tablet at 12/17/17 0804  . nicotine polacrilex (NICORETTE) gum 2 mg  2 mg Oral PRN Koltan Portocarrero, [name], FNP   2 mg at 12/16/17 0926  . QUEtiapine (SEROQUEL) tablet 150 mg  150 mg Oral QHS Darreon Lutes, [name], FNP      . QUEtiapine (SEROQUEL) tablet 50 mg  50 mg Oral BID Rabecca Birge, [name], FNP      . thiamine (B-1) injection 100 mg  100 mg Intramuscular Once Pristine Gladhill, [name], FNP      . thiamine (VITAMIN B-1) tablet 100 mg  100 mg Oral Daily Keishawn Rajewski, [name], FNP   100 mg at 12/17/17 16100805    Lab Results:  No results found for this or any previous visit (from the past 48 hour(s)).  Blood Alcohol level:  Lab Results  Component Value Date   ETH 345 ([facility]) 12/12/2017   ETH 294 ([facility]) 12/01/2017    Metabolic Disorder Labs: No results found for: HGBA1C, MPG No results found for: PROLACTIN No results found for: CHOL, TRIG, HDL, CHOLHDL, VLDL, LDLCALC  Physical Findings: AIMS: Facial and Oral Movements Muscles of Facial Expression: None, normal Lips and Perioral Area: None, normal Jaw: None, normal Tongue: None, normal,Extremity Movements Upper (arms, wrists, hands, fingers): None, normal Lower (legs, knees, ankles, toes): None, normal, Trunk Movements Neck, shoulders, hips: None, normal, Overall Severity Severity of abnormal movements (highest score from questions above): None, normal Incapacitation due to abnormal movements: None, normal Patient's awareness of abnormal movements (rate only patient's report): No Awareness, Dental Status Current problems with teeth and/or dentures?: No Does patient usually wear dentures?: No  CIWA:  CIWA-Ar Total: 2 COWS:     Musculoskeletal: Strength & Muscle Tone: within normal limits Gait & Station: normal Patient leans: N/A  Psychiatric Specialty Exam: Physical Exam  Nursing note and vitals reviewed. Constitutional: He is oriented to person, place, and time. He  appears well-developed and well-nourished.  Respiratory: Effort normal.  Musculoskeletal: Normal range of motion.  Neurological: He is alert and oriented to person, place, and time.  Skin: Skin is warm.    Review of Systems  Constitutional: Negative.   HENT: Negative.   Eyes: Negative.   Respiratory: Negative.   Cardiovascular: Negative.   Gastrointestinal: Negative.   Genitourinary: Negative.   Musculoskeletal: Negative.   Skin: Negative.   Neurological: Negative.   Endo/Heme/Allergies: Negative.   Psychiatric/Behavioral: Positive for depression and substance abuse. Negative for hallucinations. The patient is nervous/anxious and has insomnia.     Blood pressure 129/79, pulse (!) 102, temperature 98 F (36.7 C), temperature source Oral, resp. rate  16, height 5\' 7"  (1.702 m), weight 68.3 kg (150 lb 8 oz).Body mass index is 23.57 kg/m.  General Appearance: Casual  Eye Contact:  Good  Speech:  Clear and Coherent and Normal Rate  Volume:  Increased  Mood:  Euthymic  Affect:  Congruent  Thought Process:  Goal Directed and Descriptions of Associations: Intact  Orientation:  Full (Time, Place, and Person)  Thought Content:  WDL  Suicidal Thoughts:  No   Homicidal Thoughts:  No  Memory:  Immediate;   Good Recent;   Good Remote;   Good  Judgement:  Fair  Insight:  Fair  Psychomotor Activity:  Normal  Concentration:  Concentration: Good and Attention Span: Good  Recall:  Good  Fund of Knowledge:  Good  Language:  Good  Akathisia:  No  Handed:  Right  AIMS (if indicated):     Assets:  Communication Skills Desire for Improvement Financial Resources/Insurance Housing Physical Health Social Support Transportation  ADL's:  Intact  Cognition:  WNL  Sleep:  Number of Hours: 6.25   Problems Addressed: MDD severe Alcoholism  Treatment Plan Summary: Daily contact with patient to assess and evaluate symptoms and progress in treatment, Medication management and Plan is to:   -Continue Campral 666 mg PO TID for cravings -Increase Seroquel 150 mg PO QHS for mood stability -Increase Seroquel 50 mg PO BID for agitation and mood stability -Continue Gabapentin 300 mg PO TID for withdrawal symptoms  -Continue Vistaril 50 mg PO Q6H PRN fo ranxiety -Continue Librium Detox Protocol -Increase Celexa 20 mg PO Daily for mood stability -Encourage group therapy participation  Maryfrances Bunnell, FNP 12/17/2017, 1:11 PM

## 2017-12-17 NOTE — Progress Notes (Signed)
Patient ID: Riley [name], male   DOB: December 02, 1990, 27 y.o.   MRN: [id number]  Pt currently presents with an appropriate affect and cooperative behavior. Pt reports that he is still having "passive SI, no plan." Pt complains of headache and heartburn tonight, requests PRNs. Pt reports poor sleep with current medication regimen, reports he will sleep well tonight as "The doctor changed my sleep medication to what its supposed to be, 150 mg (of Seroquel).   Pt provided with medications per providers orders. Pt's labs and vitals were monitored throughout the night. Pt given a 1:1 about emotional and mental status. Pt supported and encouraged to express concerns and questions. Pt educated on medications. Provider consulted about heartburn, see orders.  Pt's safety ensured with 15 minute and environmental checks. Pt currently denies HI and A/V hallucinations. Pt verbally agrees to seek staff if SI worsens, HI or A/VH occurs and to consult with staff before acting on any harmful thoughts. Will continue POC.

## 2017-12-17 NOTE — Plan of Care (Signed)
Pt has been out in the dayroom this morning interacting with peers.

## 2017-12-18 NOTE — Progress Notes (Signed)
The patient appeared to be doing well this evening. He reported that he is doing well. He denied SI/HI and denied Hallucinations. His said his mood "average". Reported that he still have some tremors, none visible. He endorsed suicide thoughts but said " I have thoughts but no intentions". He received Seroquel 150 mg as scheduled and asked for Neurontin and Vistaril. Neurontin not scheduled at the time .Writer told patient to come back for Vistaril after an hour if not asleep. Encouraged and supported patient. Q 15 minute check continues for safety.

## 2017-12-18 NOTE — Progress Notes (Signed)
Recreation Therapy Notes  Date: 12/18/17 Time: 0930 Location: 300 Hall Dayroom  Group Topic: Stress Management  Goal Area(s) Addresses:  Patient will verbalize importance of using healthy stress management.  Patient will identify positive emotions associated with healthy stress management.   Intervention: Stress Management  Activity :  Progressive Muscle Relaxation.  LRT introduced the stress management technique of progressive muscle relaxation.  LRT read a script to guide the patients through the process of tensing and relaxing each muscle group individually.    Education:  Stress Management, Discharge Planning.   Education Outcome: Acknowledges edcuation/In group clarification offered/Needs additional education  Clinical Observations/Feedback: Pt did not attend group.     Allexus Ovens, LRT/CTRS         Theodore Virgin A 12/18/2017 11:56 AM 

## 2017-12-18 NOTE — B[facility] Group Notes (Signed)
LCSW Group Therapy Note 12/18/2017 12:56 PM  Type of Therapy/Topic: Group Therapy: Feelings about Diagnosis  Participation Level: Did Not Attend   Description of Group:  This group will allow patients to explore their thoughts and feelings about diagnoses they have received. Patients will be guided to explore their level of understanding and acceptance of these diagnoses. Facilitator will encourage patients to process their thoughts and feelings about the reactions of others to their diagnosis and will guide patients in identifying ways to discuss their diagnosis with significant others in their lives. This group will be process-oriented, with patients participating in exploration of their own experiences, giving and receiving support, and processing challenge from other group members.  Therapeutic Goals: 1. Patient will demonstrate understanding of diagnosis as evidenced by identifying two or more symptoms of the disorder 2. Patient will be able to express two feelings regarding the diagnosis 3. Patient will demonstrate their ability to communicate their needs through discussion and/or role play  Summary of Patient Progress:  Invited, chose not to attend.    Therapeutic Modalities:  Cognitive Behavioral Therapy Brief Therapy Feelings Identification    Riley [name] LCSWA Clinical Social Worker

## 2017-12-18 NOTE — Progress Notes (Signed)
[facility]  12/18/2017 3:35 PM Riley SermonRobert G [name]  MRN:  [id number]   Subjective: [facility]. I have learned to be honest, and that my addiction can get worse.   Objective: Patient's chart and findings reviewed and discussed with treatment team. Patient presents in the day room attending group . He has set goals to help with his addiction when [facility]. He plans to attend the 28 day program and is looking forward to combating his addiction. He reports doing a 90 day program last year but missed his entire summer. He is observed sleeping in the bed, during free time. He notes some improvement since his admission and reports reduced symptoms and cravings. He is engaging and presenting well. He has sense of humor and insight for improvement. Will continue the gradual increase of the Seroquel to 150 mg QHS and 50 mg BID. He has not appeared drowsy or isolating. Due to severe depression will increase Celexa to 20 mg Daily as well.   He is tolerating his medication well at this time, and states it is working good and adjusted well.   Principal Problem: MDD (major depressive disorder), severe (HCC) Diagnosis:   Patient Active Problem List   Diagnosis Date Noted  . MDD (major depressive disorder), severe (HCC) [F32.2] 12/13/2017  . Alcoholism (HCC) [F10.20] 12/12/2017  . Hypokalemia [E87.6] 12/12/2017  . Suicidal ideations [R45.851] 12/12/2017  . Hypoglycemia due to insulin [E16.0, T38.3X5A] 12/12/2017  . Alcohol abuse with intoxication (HCC) [F10.129] 12/12/2017  . Hypoglycemia [E16.2] 12/12/2017  . Insulin overdose [T38.3X1A]    Total Time spent with patient: 20 minutes  Past Psychiatric History: See H&P  Past Medical History:  Past Medical History:  Diagnosis Date  . Alcoholism (HCC)    History reviewed. No pertinent surgical history. Family History:  Family History  Family history unknown: Yes   Family Psychiatric  History: See  H&P Social History:  Social History   Substance and Sexual Activity  Alcohol Use Yes   Comment: Pt stated "I drink  1/2 gal of 100 proof every day"     Social History   Substance and Sexual Activity  Drug Use No    Social History   Socioeconomic History  . Marital status: Single    Spouse name: None  . Number of children: None  . Years of education: None  . Highest education level: None  Social Needs  . Financial resource strain: None  . Food insecurity - worry: None  . Food insecurity - inability: None  . Transportation needs - medical: None  . Transportation needs - non-medical: None  Occupational History  . None  Tobacco Use  . Smoking status: Current Every Day Smoker    Packs/day: 2.00    Types: Cigarettes  . Smokeless tobacco: Never Used  Substance and Sexual Activity  . Alcohol use: Yes    Comment: Pt stated "I drink  1/2 gal of 100 proof every day"  . Drug use: No  . Sexual activity: Not Currently  Other Topics Concern  . None  Social History Narrative  . None   Additional Social History:        Sleep: Good  Appetite:  Good  Current Medications: Current Facility-Administered Medications  Medication Dose Route Frequency Provider Last Rate Last Dose  . acetaminophen (TYLENOL) tablet 650 mg  650 mg Oral Q6H PRN Money, [name], FNP   650 mg at 12/17/17 2147  .  alum & mag hydroxide-simeth (MAALOX/MYLANTA) 200-200-20 MG/5ML suspension 30 mL  30 mL Oral Q6H PRN Nira Conn A, NP   30 mL at 12/17/17 2227  . bisacodyl (DULCOLAX) EC tablet 5 mg  5 mg Oral Daily PRN [name], [name], NP      . citalopram (CELEXA) tablet 20 mg  20 mg Oral Daily Money, Travis B, FNP   20 mg at 12/18/17 7564  . feeding supplement (ENSURE ENLIVE) (ENSURE ENLIVE) liquid 237 mL  237 mL Oral BID BM [name], [name], NP   237 mL at 12/18/17 3329  . gabapentin (NEURONTIN) capsule 300 mg  300 mg Oral TID Money, Gerlene Burdock, FNP   300 mg at 12/18/17 1205  . hydrOXYzine (ATARAX/VISTARIL)  tablet 50 mg  50 mg Oral TID PRN Money, Gerlene Burdock, FNP   50 mg at 12/18/17 0823  . multivitamin with minerals tablet 1 tablet  1 tablet Oral Daily Nira Conn A, NP   1 tablet at 12/18/17 5188  . nicotine polacrilex (NICORETTE) gum 2 mg  2 mg Oral PRN Money, Gerlene Burdock, FNP   2 mg at 12/16/17 0926  . QUEtiapine (SEROQUEL) tablet 150 mg  150 mg Oral QHS Money, Travis B, FNP   150 mg at 12/17/17 2148  . QUEtiapine (SEROQUEL) tablet 50 mg  50 mg Oral BID Money, Gerlene Burdock, FNP   50 mg at 12/18/17 4166  . thiamine (B-1) injection 100 mg  100 mg Intramuscular Once Money, Feliz Beam B, FNP      . thiamine (VITAMIN B-1) tablet 100 mg  100 mg Oral Daily Money, Gerlene Burdock, FNP   100 mg at 12/18/17 0630    Lab Results:  No results found for this or any previous visit (from the past 48 hour(s)).  Blood Alcohol level:  Lab Results  Component Value Date   ETH 345 (HH) 12/12/2017   ETH 294 (H) 12/01/2017    Metabolic Disorder Labs: No results found for: HGBA1C, MPG No results found for: PROLACTIN No results found for: CHOL, TRIG, HDL, CHOLHDL, VLDL, LDLCALC  Physical Findings: AIMS: Facial and Oral Movements Muscles of Facial Expression: None, normal Lips and Perioral Area: None, normal Jaw: None, normal Tongue: None, normal,Extremity Movements Upper (arms, wrists, hands, fingers): None, normal Lower (legs, knees, ankles, toes): None, normal, Trunk Movements Neck, shoulders, hips: None, normal, Overall Severity Severity of abnormal movements (highest score from questions above): None, normal Incapacitation due to abnormal movements: None, normal Patient's awareness of abnormal movements (rate only patient's report): No Awareness, Dental Status Current problems with teeth and/or dentures?: No Does patient usually wear dentures?: No  CIWA:  CIWA-Ar Total: 2 COWS:     Musculoskeletal: Strength & Muscle Tone: within normal limits Gait & Station: normal Patient leans: N/A  Psychiatric Specialty  Exam: Physical Exam  Nursing note and vitals reviewed. Constitutional: He is oriented to person, place, and time. He appears well-developed and well-nourished.  Respiratory: Effort normal.  Musculoskeletal: Normal range of motion.  Neurological: He is alert and oriented to person, place, and time.  Skin: Skin is warm.    Review of Systems  Constitutional: Negative.   HENT: Negative.   Eyes: Negative.   Respiratory: Negative.   Cardiovascular: Negative.   Gastrointestinal: Negative.   Genitourinary: Negative.   Musculoskeletal: Negative.   Skin: Negative.   Neurological: Negative.   Endo/Heme/Allergies: Negative.   Psychiatric/Behavioral: Positive for depression and substance abuse. Negative for hallucinations. The patient is nervous/anxious and has insomnia.  Blood pressure 107/76, pulse 93, temperature 98.2 F (36.8 C), temperature source Oral, resp. rate (!) 3, height 5\' 7"  (1.702 m), weight 68.3 kg (150 lb 8 oz).Body mass index is 23.57 kg/m.  General Appearance: Casual  Eye Contact:  Good  Speech:  Clear and Coherent and Normal Rate  Volume:  Increased  Mood:  Euthymic  Affect:  Congruent  Thought Process:  Goal Directed and Descriptions of Associations: Intact  Orientation:  Full (Time, Place, and Person)  Thought Content:  WDL  Suicidal Thoughts:  No   Homicidal Thoughts:  No  Memory:  Immediate;   Good Recent;   Good Remote;   Good  Judgement:  Fair  Insight:  Fair  Psychomotor Activity:  Normal  Concentration:  Concentration: Good and Attention Span: Good  Recall:  Good  Fund of Knowledge:  Good  Language:  Good  Akathisia:  No  Handed:  Right  AIMS (if indicated):     Assets:  Communication Skills Desire for Improvement Financial Resources/Insurance Housing Physical Health Social Support Transportation  ADL's:  Intact  Cognition:  WNL  Sleep:  Number of Hours: 5.5   Problems Addressed: MDD severe Alcoholism  Treatment Plan Summary: Daily  contact with patient to assess and evaluate symptoms and progress in treatment, Medication management and Plan is to:  -Continue Campral 666 mg PO TID for cravings -Increase Seroquel 150 mg PO QHS for mood stability -Increase Seroquel 50 mg PO BID for agitation and mood stability -Continue Gabapentin 300 mg PO TID for withdrawal symptoms  -Continue Vistaril 50 mg PO Q6H PRN fo ranxiety -Continue Librium Detox Protocol -Increase Celexa 20 mg PO Daily for mood stability -Encourage group therapy participation  Truman Hayward, FNP 12/18/2017, 3:35 PM

## 2017-12-18 NOTE — B[facility] Group Notes (Signed)
Adult Psychoeducational Group Note  Date:  12/18/2017 Time:  10:29 AM  Group Topic/Focus:  Personal Choices and Values:   The focus of this group is to help patients assess and explore the importance of values in their lives, how their values affect their decisions, how they express their values and what opposes their expression.  Participation Level:  Active  Participation Quality:  Appropriate  Affect:  Appropriate  Cognitive:  Appropriate  Insight: Appropriate  Engagement in Group:  Improving  Modes of Intervention:  Clarification  Additional Comments:    Riley BeersRodney S Jamarious [name] 12/18/2017, 10:29 AM

## 2017-12-18 NOTE — B[facility] Group Notes (Signed)
[facility] Group Notes:  (Nursing/MHT/Case Management/Adjunct)  Date:  12/18/2017  Time:  1630  Type of Therapy:  Nurse Education - Promoting Wellness Through Positive Self Talk  Participation Level:  Active  Participation Quality:  Attentive  Affect:  Anxious and Appropriate  Cognitive:  Alert  Insight:  Good  Engagement in Group:  Engaged  Modes of Intervention:  Discussion, Education and Support  Summary of Progress/Problems: Patient attended group and actively participated in discussion. Patients were taught how to rephrase negative self talk to promote positive self esteem and wellness.  Lawrence MarseillesFriedman, Riley [name] 12/18/2017, 5:35 PM

## 2017-12-18 NOTE — Progress Notes (Signed)
D: Patient observed up and restless in the milieu. Patient states he is anxious but feels secure knowing he's staying until Friday when [facility] will [facility]. "IMy alcohol level was so high.  It was .34. I've nearly killed myself that way, drinking so much. It's time for another way of living life." Patient's affect flat, anxious with congruent mood. Some depression noted. Per self inventory and discussions with writer, rates depression at a 7/10, hopelessness at a 7/10 and anxiety at an 8/10. Rates sleep as good, appetite as fair, energy as normal and concentration as good.  States goal for today is "recovery, faith and hope; concentrate." Denies pain, physical complaints.   A: Medicated per orders, prn vistaril given per his request. Level III obs in place for safety. Emotional support offered and self inventory reviewed. Encouraged completion of Suicide Safety Plan and programming participation. Discussed POC with MD, SW.    R: Patient verbalizes understanding of POC. On reassess, patient is calmer. Patient denies SI to this writer however indicated on his self inventory passive SI. Verbal contract in place for safety. No HI/AVH and remains safe on level III obs. Will continue to monitor closely and make verbal contact frequently.

## 2017-12-18 NOTE — Plan of Care (Signed)
Patient verbalizes understanding of information, education provided. 

## 2017-12-19 MED ORDER — CITALOPRAM HYDROBROMIDE 20 MG PO TABS: 20.0000 mg | ORAL_TABLET | Freq: Every day | ORAL | 0 refills | Status: DC

## 2017-12-19 MED ORDER — GABAPENTIN 300 MG PO CAPS: 300.0000 mg | ORAL_CAPSULE | Freq: Three times a day (TID) | ORAL | 0 refills | Status: DC

## 2017-12-19 MED ORDER — QUETIAPINE FUMARATE 50 MG PO TABS: 50.0000 mg | ORAL_TABLET | Freq: Two times a day (BID) | ORAL | 0 refills | Status: DC

## 2017-12-19 MED ORDER — QUETIAPINE FUMARATE 50 MG PO TABS: 150.0000 mg | ORAL_TABLET | Freq: Every day | ORAL | 0 refills | Status: DC

## 2017-12-19 MED ORDER — HYDROXYZINE HCL 50 MG PO TABS: 50.0000 mg | ORAL_TABLET | Freq: Three times a day (TID) | ORAL | 0 refills | Status: DC | PRN

## 2017-12-19 NOTE — Plan of Care (Signed)
Patient active in the milieu, attending programming and unit activities.  Patient has been calm and cooperative. No outbursts, periods of anger, agitation.

## 2017-12-19 NOTE — Progress Notes (Addendum)
  [facility] :  Will you be returning to the same living situation after discharge:  No. Pt [facility] for Tuesday.  At discharge, do you have transportation home?: Yes Do you have the ability to pay for your medications: Yes,  mental health  Release of information consent forms completed and submitted to medical records by CSW.  Patient to Follow up at: Follow-up Information    [facility] Follow up on 12/24/2017.   Why:  Screening for possible admission on Tuesday, 12/27/17 at 8:00AM. Please bring: proof of [place] residency, medications/prescriptions provided by hospital, and clothing. Thank you.  Contact information: [address] [place] [address] [phone]        [facility] Follow up.   Specialty:  Behavioral Health Why:  Walk in within 3 days of hospital/rehab discharge to be assessed for outpatient mental health services including: medication management and therapy. Walk in hours: Mon-Fri 8am-9am. Thank you.  Contact information: [address] [place] [address] [phone]           Next level of care provider [facility] Link:no  Safety Planning and Suicide Prevention discussed: Yes,  SPE completed with pt; pt declined to consent to family contact. SPI pamphlet and Mobile Crisis information provided.  Have you used any form of tobacco in the last 30 days? (Cigarettes, Smokeless Tobacco, Cigars, and/or Pipes): Yes  Has patient been referred to the Quitline?: Patient refused referral  Patient has been referred for addiction treatment: Yes  Pulte HomesHeather N Smart, LCSW 12/19/2017, 11:58 AM

## 2017-12-19 NOTE — Progress Notes (Signed)
Pt d/c from the hospital with bus passes. All items returned. D/C instructions given, samples given and prescriptions given. Pt denies si and hi.

## 2017-12-19 NOTE — B[facility] Group Notes (Signed)
LCSW Group Therapy Note  12/19/2017 1:15pm  Type of Therapy and Topic:  Group Therapy:  Feelings around Relapse and Recovery  Participation Level:  Did Not Attend--pt chose to remain in hall (pacing)   Description of Group:    Patients in this group will discuss emotions they experience before and after a relapse. They will process how experiencing these feelings, or avoidance of experiencing them, relates to having a relapse. Facilitator will guide patients to explore emotions they have related to recovery. Patients will be encouraged to process which emotions are more powerful. They will be guided to discuss the emotional reaction significant others in their lives may have to their relapse or recovery. Patients will be assisted in exploring ways to respond to the emotions of others without this contributing to a relapse.  Therapeutic Goals: 1. Patient will identify two or more emotions that lead to a relapse for them 2. Patient will identify two emotions that result when they relapse 3. Patient will identify two emotions related to recovery 4. Patient will demonstrate ability to communicate their needs through discussion and/or role plays   Summary of Patient Progress:  x   Therapeutic Modalities:   Cognitive Behavioral Therapy Solution-Focused Therapy Assertiveness Training Relapse Prevention Therapy   Ledell PeoplesHeather N Smart, LCSW 12/19/2017 12:49 PM

## 2017-12-19 NOTE — Progress Notes (Signed)
Pt requested that CSW e-mail his probation officer admission/discharge dates, reason for admission, and disposition information. E-mail address provided by pt: [email or web address]  Trula SladeHeather Smart, MSW, LCSW Clinical Social Worker 12/19/2017 3:08 PM

## 2017-12-19 NOTE — [facility] Suicide Risk Assessment (Signed)
[facility] Discharge Suicide Risk Assessment   Principal Problem: MDD (major depressive disorder), severe ([place]) Discharge Diagnoses: Substance Induced Mood Disorder Patient Active Problem List   Diagnosis Date Noted  . MDD (major depressive disorder), severe ([place]) [F32.2] 12/13/2017  . Alcoholism ([place]) [F10.20] 12/12/2017  . Hypokalemia [E87.6] 12/12/2017  . Suicidal ideations [R45.851] 12/12/2017  . Hypoglycemia due to insulin [E16.0, T38.3X5A] 12/12/2017  . Alcohol abuse with intoxication ([place]) [F10.129] 12/12/2017  . Hypoglycemia [E16.2] 12/12/2017  . Insulin overdose [T38.3X1A]     Total Time spent with patient: 45 minutes  Musculoskeletal: Strength & Muscle Tone: within normal limits Gait & Station: normal Patient leans: N/A  Psychiatric Specialty Exam: Review of Systems  Constitutional: Negative.   HENT: Negative.   Eyes: Negative.   Respiratory: Negative.   Cardiovascular: Negative.   Gastrointestinal: Negative.   Genitourinary: Negative.   Musculoskeletal: Negative.   Skin: Negative.   Neurological: Negative.   Endo/Heme/Allergies: Negative.   Psychiatric/Behavioral: Negative for depression, hallucinations, memory loss, substance abuse and suicidal ideas. The patient is not nervous/anxious and does not have insomnia.     Blood pressure (!) 138/95, pulse 87, temperature 98.6 F (37 C), temperature source Oral, resp. rate 12, height _0  (1.702 m), weight 68.3 kg (150 lb 8 oz).Body mass index is 23.57 kg/m.  General Appearance: Neatly dressed, pleasant, engaging well and cooperative. Appropriate behavior. Not in any distress. Good relatedness. Not internally stimulated.  Eye Contact::  Good  Speech:  Spontaneous, normal prosody. Normal tone and rate.   Volume:  Normal  Mood:  Euthymic  Affect:  Appropriate and Full Range  Thought Process:  Linear  Orientation:  Full (Time, Place, and Person)  Thought Content:  No delusional theme. No preoccupation with violent  thoughts. No negative ruminations. No obsession.  No hallucination in any modality.   Suicidal Thoughts:  No  Homicidal Thoughts:  No  Memory:  Immediate;   Good Recent;   Good Remote;   Good  Judgement:  Good  Insight:  Good  Psychomotor Activity:  Normal  Concentration:  Good  Recall:  Good  Fund of Knowledge:Good  Language: Good  Akathisia:  Negative  Handed:    AIMS (if indicated):     Assets:  Communication Skills Desire for Improvement Physical Health Resilience  Sleep:  Number of Hours: 5.25  Cognition: WNL  ADL's:  Intact   Clinical Assessment::    27 y.o Caucasian male, single, unemployed, homeless, on parole for sexual charges. Background history of AUD. Presented to the ER via the police. Patient was intoxicated with alcohol and hypoglycemic at presentation. His friend called the police after patient informed him that he had injected self with insulin. Patient was managed medically before being referred here. UDS is was negative. BAL 294 mg/dl. He has been completely detoxed from alcohol. He is now on psychotropic medications for his mood.  Chart reviewed today. Patient discussed at team today. Patient was scheduled to be discharged to [facility] tomorrow. Plans feel through because they do not have any bed. Intake for him has been rescheduled for Tuesday next week. Patient feels there is no point staying here till then. He has been anxious to get discharged. Staff states that he has been pleasant. He has been interacting well with peers. He has not expressed any suicidal thoughts. He has not expressed any thoughts of violence. Sleep wake cycle is normal.  I met with him for the first time today. Patient tells me that he was intoxicated and  acted impulsively. Says it was his friends insulin that he injected. Says he felt funny and woke his friend up. Says he is glad to be alive. He never planned this. No act to avoid being detected. No research into the effects of insulin. Tells  me that he has never attempted suicide before that. Says he has no desire to try that again. Repeatedly tells me that he is not suicidal. Says he plans to get his belongings from his friends place and check into the shelter. He plans to get into [facility] next Tuesday. Reports that he is in good spirits. He is not feeling depressed. Reports normal energy and interest. He has been maintaining normal biological functions. He is able to think clearly. He is able to focus on task. His thoughts are not crowded or racing. No evidence of mania. No hallucination in any modality. He is not making any delusional statement. No passivity of will/thought. He is fully in touch with reality. No thoughts of suicide. No thoughts of homicide. No violent thoughts. No overwhelming anxiety. No access to weapons. No craving for substances. Says all he wants to do is smoke.  Denies any new stressors.      Demographic Factors:  Low socioeconomic status and Unemployed  Loss Factors: Legal issues  Historical Factors: Impulsivity  Risk Reduction Factors:   Positive social support, Positive therapeutic relationship and Positive coping skills or problem solving skills  Continued Clinical Symptoms:  As above   Cognitive Features That Contribute To Risk:  None    Suicide Risk:  Minimal: No identifiable suicidal ideation.  Patient is not having any thoughts of suicide at this time. Modifiable risk factors targeted during this admission includes depression, impulsivity and substance use. Demographical and historical risk factors cannot be modified. Patient is now engaging well. Patient is reliable and is future oriented. We have buffered patient's support structures. At this point, patient is at low risk of suicide. Patient is aware of the effects of psychoactive substances on decision making process. Patient has been provided with emergency contacts. Patient acknowledges to use resources provided if unforseen circumstances  changes their current risk stratification.    Follow-up Information    [facility], [facility] Follow up on 12/24/2017.   Why:  Screening for possible admission on Tuesday, 12/27/17 at 8:00AM. Please bring: proof of [place] residency, medications/prescriptions provided by hospital, and clothing. Thank you.  Contact information: [place] [address] [phone]        [facility] Follow up.   Specialty:  Behavioral Health Why:  Walk in within 3 days of hospital/rehab discharge to be assessed for outpatient mental health services including: medication management and therapy. Walk in hours: Mon-Fri 8am-9am. Thank you.  Contact information: [place] [address] [phone]           Plan Of Care/Follow-up recommendations:  1. Continue current psychotropic medications 2. Mental health and addiction follow up as arranged.  3. Provided limited quantity of prescriptions   Artist [place], MD 12/19/2017, 2:46 PM
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
## 2017-12-19 NOTE — Progress Notes (Signed)
Pt attended morning Orientation/Daily Goals Group and stated that for the day he would like to work towards learning patience and how to persevere through tough times that he will encounter. Pt is aware of the rules of the unit and has agreed to abide by them.

## 2017-12-19 NOTE — Progress Notes (Signed)
D: Patient observed up and restless on unit. Patient received notification that [facility] screening has been pushed to Tuesday morning. Patient is not pacing, anxious to leave and states "there's no reason for me to sit here until Tuesday. I will just be taking up space.  There's no difference between now and 6am tomorrow when I was going to leave." Patient's affect animated, anxious with congruent mood. Per self inventory and discussions with writer, rates depression at a 5/10, hopelessness at a 5/10 and anxiety at a 7/10. Rates sleep as good, appetite as good, energy as normal and concentration as good.  States goal for today is "recovery and the commitment to it." Denies pain, physical complaints.   A: Medicated per orders, prn vistaril given for anxiety. Level III obs in place for safety. Emotional support offered and self inventory reviewed. Encouraged completion of Suicide Safety Plan and programming participation. Discussed POC with MD, SW.   R: Patient verbalizes understanding of POC. On reassess, patient was calmer. Patient now anxious, eager to speak with doctor. Doctor aware. Patient denies SI/HI/AVH and remains safe on level III obs. Will continue to monitor closely and make verbal contact frequently.

## 2017-12-20 ENCOUNTER — Emergency Department (HOSPITAL_COMMUNITY)
Admission: EM | Admit: 2017-12-20 | Discharge: 2017-12-21 | Disposition: A | Discharge status: Other Acute Inpatient Hospital | Payer: Self-pay | Attending: Emergency Medicine | Admitting: Emergency Medicine

## 2017-12-20 ENCOUNTER — Ambulatory Visit (HOSPITAL_COMMUNITY)
Admission: RE | Admit: 2017-12-20 | Discharge: 2017-12-20 | Disposition: A | Discharge status: Home / Self Care | Payer: Self-pay | Attending: Psychiatry | Admitting: Psychiatry

## 2017-12-20 DIAGNOSIS — Z79899 Other long term (current) drug therapy: Secondary | ICD-10-CM | POA: Insufficient documentation

## 2017-12-20 DIAGNOSIS — R45851 Suicidal ideations: Secondary | ICD-10-CM | POA: Insufficient documentation

## 2017-12-20 DIAGNOSIS — F1020 Alcohol dependence, uncomplicated: Secondary | ICD-10-CM | POA: Insufficient documentation

## 2017-12-20 DIAGNOSIS — F322 Major depressive disorder, single episode, severe without psychotic features: Secondary | ICD-10-CM | POA: Insufficient documentation

## 2017-12-20 DIAGNOSIS — F329 Major depressive disorder, single episode, unspecified: Secondary | ICD-10-CM | POA: Insufficient documentation

## 2017-12-20 DIAGNOSIS — F1010 Alcohol abuse, uncomplicated: Secondary | ICD-10-CM | POA: Insufficient documentation

## 2017-12-20 DIAGNOSIS — Z1330 Encounter for screening examination for mental health and behavioral disorders, unspecified: Secondary | ICD-10-CM | POA: Insufficient documentation

## 2017-12-20 DIAGNOSIS — F32A Depression, unspecified: Secondary | ICD-10-CM

## 2017-12-20 DIAGNOSIS — F17210 Nicotine dependence, cigarettes, uncomplicated: Secondary | ICD-10-CM | POA: Insufficient documentation

## 2017-12-20 NOTE — H&P (Signed)
Behavioral Health Medical Screening Exam  Riley SermonRobert G [name] is an 27 y.o. male.  Total Time spent with patient: 15 minutes  Psychiatric Specialty Exam: Physical Exam  Constitutional: He is oriented to person, place, and time. He appears well-developed and well-nourished. No distress.  HENT:  Head: Normocephalic and atraumatic.  Right Ear: External ear normal.  Left Ear: External ear normal.  Eyes: Conjunctivae are normal. Right eye exhibits no discharge. Left eye exhibits no discharge. No scleral icterus.  Respiratory: Effort normal. No respiratory distress.  Musculoskeletal: Normal range of motion.  Neurological: He is alert and oriented to person, place, and time.  Skin: Skin is warm and dry. He is not diaphoretic.  Psychiatric: His speech is normal. His mood appears anxious. His affect is not labile. He is not agitated, not aggressive, not withdrawn and not actively hallucinating. Thought content is not paranoid and not delusional. Cognition and memory are normal. He expresses impulsivity and inappropriate judgment. He exhibits a depressed mood. He expresses suicidal ideation. He expresses no homicidal ideation. He expresses suicidal plans.    Review of Systems  Constitutional: Negative for chills, fever and weight loss.  Psychiatric/Behavioral: Positive for depression, substance abuse and suicidal ideas. Negative for hallucinations and memory loss. The patient is nervous/anxious and has insomnia.   All other systems reviewed and are negative.   There were no vitals taken for this visit.There is no height or weight on file to calculate BMI.  General Appearance: Casual and Well Groomed  Eye Contact:  Fair  Speech:  Clear and Coherent and Normal Rate  Volume:  Normal  Mood:  Anxious, Depressed, Dysphoric, Hopeless, Irritable and Worthless  Affect:  Congruent and Depressed  Thought Process:  Coherent, Goal Directed and Descriptions of Associations: Intact  Orientation:  Full (Time,  Place, and Person)  Thought Content:  Logical, Hallucinations: None and Rumination  Suicidal Thoughts:  Yes.  with intent/plan  Homicidal Thoughts:  No  Memory:  Immediate;   Good Recent;   Good  Judgement:  Impaired  Insight:  Lacking  Psychomotor Activity:  Normal  Concentration: Concentration: Fair and Attention Span: Fair  Recall:  Good  Fund of Knowledge:Good  Language: Good  Akathisia:  No  Handed:  Right  AIMS (if indicated):     Assets:  Communication Skills Desire for Improvement Intimacy Leisure Time Physical Health Social Support  Sleep:       Musculoskeletal: Strength & Muscle Tone: within normal limits Gait & Station: normal   There were no vitals taken for this visit.  Recommendations:  Based on my evaluation the patient does not appear to have an emergency medical condition.  Jackelyn PolingJason A Berry, NP 12/20/2017, 10:06 PM

## 2017-12-20 NOTE — ED Notes (Signed)
Bed: [id number] Expected date:  Expected time:  Means of arrival:  Comments: From [facility]-medical clearance
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
## 2017-12-20 NOTE — Progress Notes (Signed)
CSW received email yesterday late afternoon that [facility] and that he had an appt with her at 9am the next day. CSW called pt's cell-his friend answered and shared that pt has he is sleeping but pt had called his PO and arranged another time to see her. Pt is staying with a friend rather than at the shelter.   Trula SladeHeather Smart, MSW, LCSW Clinical Social Worker 12/20/2017 8:31 AM

## 2017-12-21 ENCOUNTER — Other Ambulatory Visit: Payer: Self-pay

## 2017-12-21 ENCOUNTER — Inpatient Hospital Stay (HOSPITAL_COMMUNITY)
Admission: AD | Admit: 2017-12-21 | Discharge: 2017-12-24 | DRG: 885 | Disposition: A | Discharge status: Home / Self Care | Payer: Federal, State, Local not specified - Other | Source: Intra-hospital | Attending: Psychiatry | Admitting: Psychiatry

## 2017-12-21 ENCOUNTER — Encounter (HOSPITAL_COMMUNITY): Payer: Self-pay | Admitting: *Deleted

## 2017-12-21 DIAGNOSIS — F10239 Alcohol dependence with withdrawal, unspecified: Secondary | ICD-10-CM | POA: Diagnosis present

## 2017-12-21 DIAGNOSIS — F332 Major depressive disorder, recurrent severe without psychotic features: Secondary | ICD-10-CM | POA: Diagnosis not present

## 2017-12-21 DIAGNOSIS — Z590 Homelessness: Secondary | ICD-10-CM

## 2017-12-21 DIAGNOSIS — G4700 Insomnia, unspecified: Secondary | ICD-10-CM | POA: Diagnosis present

## 2017-12-21 DIAGNOSIS — Z79899 Other long term (current) drug therapy: Secondary | ICD-10-CM | POA: Diagnosis not present

## 2017-12-21 DIAGNOSIS — Z653 Problems related to other legal circumstances: Secondary | ICD-10-CM | POA: Diagnosis not present

## 2017-12-21 DIAGNOSIS — R45851 Suicidal ideations: Secondary | ICD-10-CM | POA: Diagnosis not present

## 2017-12-21 DIAGNOSIS — F17210 Nicotine dependence, cigarettes, uncomplicated: Secondary | ICD-10-CM | POA: Diagnosis present

## 2017-12-21 DIAGNOSIS — Y908 Blood alcohol level of 240 mg/100 ml or more: Secondary | ICD-10-CM

## 2017-12-21 DIAGNOSIS — F4010 Social phobia, unspecified: Secondary | ICD-10-CM

## 2017-12-21 DIAGNOSIS — R450 Nervousness: Secondary | ICD-10-CM | POA: Diagnosis not present

## 2017-12-21 DIAGNOSIS — Z818 Family history of other mental and behavioral disorders: Secondary | ICD-10-CM

## 2017-12-21 DIAGNOSIS — F1010 Alcohol abuse, uncomplicated: Secondary | ICD-10-CM

## 2017-12-21 DIAGNOSIS — F419 Anxiety disorder, unspecified: Secondary | ICD-10-CM | POA: Diagnosis not present

## 2017-12-21 LAB — COMPREHENSIVE METABOLIC PANEL
ALBUMIN: 3.5 g/dL (ref 3.5–5.0)
ALT: 94 U/L — ABNORMAL HIGH (ref 17–63)
ANION GAP: 10 (ref 5–15)
AST: 38 U/L (ref 15–41)
Alkaline Phosphatase: 88 U/L (ref 38–126)
BUN: 13 mg/dL (ref 6–20)
CO2: 28 mmol/L (ref 22–32)
Calcium: 9.2 mg/dL (ref 8.9–10.3)
Chloride: 104 mmol/L (ref 101–111)
Creatinine, Ser: 0.87 mg/dL (ref 0.61–1.24)
GFR calc Af Amer: >60 mL/min (ref >60)
GFR calc non Af Amer: >60 mL/min (ref >60)
GLUCOSE: 126 mg/dL — AB (ref 65–99)
POTASSIUM: 3.7 mmol/L (ref 3.5–5.1)
SODIUM: 142 mmol/L (ref 135–145)
Total Bilirubin: 0.7 mg/dL (ref 0.3–1.2)
Total Protein: 6.8 g/dL (ref 6.5–8.1)

## 2017-12-21 LAB — RAPID URINE DRUG SCREEN, HOSP PERFORMED
AMPHETAMINES: NOT DETECTED
BARBITURATES: NOT DETECTED
BENZODIAZEPINES: POSITIVE — AB
Cocaine: NOT DETECTED
Opiates: NOT DETECTED
TETRAHYDROCANNABINOL: NOT DETECTED

## 2017-12-21 LAB — CBC
HCT: 41.8 % (ref 39.0–52.0)
HEMOGLOBIN: 13.9 g/dL (ref 13.0–17.0)
MCH: 33.3 pg (ref 26.0–34.0)
MCHC: 33.3 g/dL (ref 30.0–36.0)
MCV: 100.2 fL — ABNORMAL HIGH (ref 78.0–100.0)
Platelets: 348 10*3/uL (ref 150–400)
RBC: 4.17 MIL/uL — ABNORMAL LOW (ref 4.22–5.81)
RDW: 15.1 % (ref 11.5–15.5)
WBC: 6.8 10*3/uL (ref 4.0–10.5)

## 2017-12-21 LAB — SALICYLATE LEVEL: Salicylate Lvl: <7 mg/dL (ref 2.8–30.0)

## 2017-12-21 LAB — ACETAMINOPHEN LEVEL: Acetaminophen (Tylenol), Serum: <10 ug/mL — ABNORMAL LOW (ref 10–30)

## 2017-12-21 LAB — ETHANOL

## 2017-12-21 MED ORDER — GABAPENTIN 300 MG PO CAPS
300.0000 mg | ORAL_CAPSULE | Freq: Three times a day (TID) | ORAL | Status: DC
  Administered 2017-12-21 – 2017-12-23 (×8): 300 mg via ORAL
  Filled 2017-12-21 (×2): qty 1
  Filled 2017-12-21: qty 21
  Filled 2017-12-21: qty 1
  Filled 2017-12-21: qty 21
  Filled 2017-12-21 (×3): qty 1
  Filled 2017-12-21: qty 21
  Filled 2017-12-21 (×4): qty 1

## 2017-12-21 MED ORDER — CITALOPRAM HYDROBROMIDE 20 MG PO TABS
20.0000 mg | ORAL_TABLET | Freq: Every day | ORAL | Status: DC
  Administered 2017-12-21 – 2017-12-23 (×3): 20 mg via ORAL
  Filled 2017-12-21: qty 1
  Filled 2017-12-21: qty 7
  Filled 2017-12-21 (×3): qty 1
  Filled 2017-12-21: qty 2

## 2017-12-21 MED ORDER — LORAZEPAM 2 MG/ML IJ SOLN: 0.0000 mg | Freq: Two times a day (BID) | INTRAMUSCULAR | Status: DC

## 2017-12-21 MED ORDER — LOPERAMIDE HCL 2 MG PO CAPS: 2.0000 mg | ORAL_CAPSULE | ORAL | Status: DC | PRN

## 2017-12-21 MED ORDER — VITAMIN B-1 100 MG PO TABS: 100.0000 mg | ORAL_TABLET | Freq: Every day | ORAL | Status: DC

## 2017-12-21 MED ORDER — LORAZEPAM 1 MG PO TABS: 0.0000 mg | ORAL_TABLET | Freq: Two times a day (BID) | ORAL | Status: DC

## 2017-12-21 MED ORDER — HYDROXYZINE HCL 50 MG PO TABS
50.0000 mg | ORAL_TABLET | Freq: Three times a day (TID) | ORAL | Status: DC | PRN
  Administered 2017-12-22 – 2017-12-23 (×4): 50 mg via ORAL
  Filled 2017-12-21: qty 10
  Filled 2017-12-21 (×3): qty 1
  Filled 2017-12-21: qty 10
  Filled 2017-12-21: qty 1

## 2017-12-21 MED ORDER — LORAZEPAM 1 MG PO TABS
1.0000 mg | ORAL_TABLET | Freq: Four times a day (QID) | ORAL | Status: DC | PRN
  Administered 2017-12-21 – 2017-12-22 (×2): 1 mg via ORAL
  Filled 2017-12-21 (×2): qty 1

## 2017-12-21 MED ORDER — MAGNESIUM HYDROXIDE 400 MG/5ML PO SUSP: 30.0000 mL | Freq: Every day | ORAL | Status: DC | PRN

## 2017-12-21 MED ORDER — HYDROXYZINE HCL 25 MG PO TABS
25.0000 mg | ORAL_TABLET | Freq: Four times a day (QID) | ORAL | Status: DC | PRN
  Administered 2017-12-21 – 2017-12-23 (×2): 25 mg via ORAL
  Filled 2017-12-21: qty 10
  Filled 2017-12-21 (×3): qty 1

## 2017-12-21 MED ORDER — LORAZEPAM 1 MG PO TABS
0.0000 mg | ORAL_TABLET | Freq: Four times a day (QID) | ORAL | Status: DC
  Administered 2017-12-21: 1 mg via ORAL
  Filled 2017-12-21: qty 1

## 2017-12-21 MED ORDER — ALUM & MAG HYDROXIDE-SIMETH 200-200-20 MG/5ML PO SUSP: 30.0000 mL | ORAL | Status: DC | PRN

## 2017-12-21 MED ORDER — QUETIAPINE FUMARATE 50 MG PO TABS
50.0000 mg | ORAL_TABLET | Freq: Two times a day (BID) | ORAL | Status: DC
  Administered 2017-12-21 – 2017-12-22 (×2): 50 mg via ORAL
  Filled 2017-12-21 (×6): qty 1

## 2017-12-21 MED ORDER — TRAZODONE HCL 50 MG PO TABS
50.0000 mg | ORAL_TABLET | Freq: Every evening | ORAL | Status: DC | PRN
  Administered 2017-12-22 – 2017-12-23 (×2): 50 mg via ORAL
  Filled 2017-12-21 (×5): qty 1

## 2017-12-21 MED ORDER — ONDANSETRON 4 MG PO TBDP: 4.0000 mg | ORAL_TABLET | Freq: Four times a day (QID) | ORAL | Status: DC | PRN

## 2017-12-21 MED ORDER — THIAMINE HCL 100 MG/ML IJ SOLN: 100.0000 mg | Freq: Every day | INTRAMUSCULAR | Status: DC

## 2017-12-21 MED ORDER — LORAZEPAM 2 MG/ML IJ SOLN: 0.0000 mg | Freq: Four times a day (QID) | INTRAMUSCULAR | Status: DC

## 2017-12-21 MED ORDER — QUETIAPINE FUMARATE 50 MG PO TABS
150.0000 mg | ORAL_TABLET | Freq: Every day | ORAL | Status: DC
  Administered 2017-12-21: 150 mg via ORAL
  Filled 2017-12-21 (×4): qty 3

## 2017-12-21 MED ORDER — NICOTINE POLACRILEX 2 MG MT GUM
2.0000 mg | CHEWING_GUM | OROMUCOSAL | Status: DC | PRN
  Administered 2017-12-21 – 2017-12-23 (×5): 2 mg via ORAL
  Filled 2017-12-21: qty 10
  Filled 2017-12-21: qty 1

## 2017-12-21 MED ORDER — ACETAMINOPHEN 325 MG PO TABS
650.0000 mg | ORAL_TABLET | Freq: Four times a day (QID) | ORAL | Status: DC | PRN
  Administered 2017-12-23: 650 mg via ORAL
  Filled 2017-12-21: qty 2

## 2017-12-21 NOTE — [facility] Group Notes (Signed)
LCSW Group Therapy Note  12/21/2017 10:00AM - 11:15 - [address], 11:15-12:00 - [address]  Type of Therapy and Topic:  Group Therapy: Anger Cues and Responses  Participation Level:  Did Not Attend   Description of Group:   In this group, patients learned how to recognize the physical, cognitive, emotional, and behavioral responses they have to anger-provoking situations.  They identified a recent time they became angry and how they reacted.  They analyzed how their reaction was possibly beneficial and how it was possibly unhelpful.  The group discussed a variety of healthier coping skills that could help with such a situation in the future.  Deep breathing was practiced briefly.  Therapeutic Goals: 1. Patients will remember their last incident of anger and how they felt emotionally and physically, what their thoughts were at the time, and how they behaved. 2. Patients will identify how their behavior at that time worked for them, as well as how it worked against them. 3. Patients will explore possible new behaviors to use in future anger situations. 4. Patients will learn that anger itself is normal and cannot be eliminated, and that healthier reactions can assist with resolving conflict rather than worsening situations.  Summary of Patient Progress:  N/A  Therapeutic Modalities:   Cognitive Behavioral Therapy  Clarion Mooneyhan J Grossman-Orr  12/21/2017 9:01 AM  

## 2017-12-21 NOTE — ED Notes (Signed)
.  EDH 

## 2017-12-21 NOTE — [facility] Suicide Risk Assessment (Signed)
[facility]   Nursing information obtained from:    Demographic factors:    Current Mental Status:    Loss Factors:    Historical Factors:    Risk Reduction Factors:     Total Time spent with patient: 45 minutes Principal Problem: Severe recurrent major depression without psychotic features (HCC) Diagnosis:   Patient Active Problem List   Diagnosis Date Noted  . Severe recurrent major depression without psychotic features (HCC) [F33.2] 12/21/2017  . MDD (major depressive disorder), severe (HCC) [F32.2] 12/13/2017  . Alcoholism (HCC) [F10.20] 12/12/2017  . Hypokalemia [E87.6] 12/12/2017  . Suicidal ideations [R45.851] 12/12/2017  . Hypoglycemia due to insulin [E16.0, T38.3X5A] 12/12/2017  . Alcohol abuse with intoxication (HCC) [F10.129] 12/12/2017  . Hypoglycemia [E16.2] 12/12/2017  . Insulin overdose [T38.3X1A]    Subjective Data:  27 y.o Caucasian male, single, unemployed, homeless, on parole for sexual charges. Background history of AUD. Recently discharged from our unit. Represented in company of a friend. Expressed suicidal thoughts. No new stressor. UDS is positive for benzodiazepines. No alcohol. Patient states that he just could not cope out there. Says suicidal thoughts came back. Feels he needs to be in a safe environment until he gets into [facility].  We have agreed to recommence medications he was recently discharged on     Continued Clinical Symptoms:  Alcohol Use Disorder Identification Test Final Score (AUDIT): 29 The "Alcohol Use Disorders Identification Test", Guidelines for Use in Primary Care, Second Edition.  World Science writerHealth Organization [facility](WHO). Score between 0-7:  no or low risk or alcohol related problems. Score between 8-15:  moderate risk of alcohol related problems. Score between 16-19:  high risk of alcohol related problems. Score 20 or above:  warrants further diagnostic evaluation for alcohol dependence and treatment.   CLINICAL  FACTORS:   Depression:   Impulsivity   Musculoskeletal: Strength & Muscle Tone: within normal limits Gait & Station: normal Patient leans: N/A  Psychiatric Specialty Exam: Physical Exam  Constitutional: He is oriented to person, place, and time. He appears well-developed and well-nourished.  HENT:  Head: Normocephalic and atraumatic.  Respiratory: Effort normal.  Neurological: He is alert and oriented to person, place, and time.  Psychiatric:  As above     ROS  Blood pressure (!) 135/92, pulse 84, temperature 97.8 F (36.6 C), temperature source Oral, resp. rate 16, height 5\' 7"  (1.702 m), weight 71.7 kg (158 lb).Body mass index is 24.75 kg/m.  General Appearance: Neatly dressed, pleasant, engaging well and cooperative. Appropriate behavior. Not in any distress. Good relatedness. Not internally stimulated  Eye Contact:  Good  Speech:  Spontaneous, normal prosody. Normal tone and rate.   Volume:  Normal  Mood:  Subjectively depressed  Affect:  Appropriate and Full Range  Thought Process:  Linear  Orientation:  Full (Time, Place, and Person)  Thought Content:  Rumination  Suicidal Thoughts:  Yes.  without intent/plan  Homicidal Thoughts:  No  Memory:  Immediate;   Good Recent;   Good Remote;   Good  Judgement:  Fair  Insight:  Fair  Psychomotor Activity:  Normal  Concentration:  Concentration: Good and Attention Span: Good  Recall:  Good  Fund of Knowledge:  Good  Language:  Good  Akathisia:  Negative  Handed:    AIMS (if indicated):     Assets:  Physical Health Resilience  ADL's:  Intact  Cognition:  WNL  Sleep:         COGNITIVE FEATURES THAT CONTRIBUTE  TO RISK:  None    SUICIDE RISK:   Mild:  Suicidal ideation of limited frequency, intensity, duration, and specificity.  There are no identifiable plans, no associated intent, mild dysphoria and related symptoms, good self-control (both objective and subjective assessment), few other risk factors, and  identifiable protective factors, including available and accessible social support.  PLAN OF CARE:  As in H&P  I certify that inpatient services furnished can reasonably be expected to improve the patient's condition.   Georgiann Cocker, MD 12/21/2017, 4:49 PM

## 2017-12-21 NOTE — ED Triage Notes (Signed)
Pt reports needing detox from ETOH and having suicidal thoughts 

## 2017-12-21 NOTE — Progress Notes (Signed)
D. Pt in bed resting with eyes closed upon initial approach. Pt easily aroused from sleep. Brought a personal pitcher of water to encourage fluids. Pt given prn vistaril for mild- mod withdrawal symptoms. Pt observed sleeping 20 minutes later. A. Labs and vitals monitored. Pt compliant with medications. Pt supported emotionally and encouraged to express concerns and ask questions.   R. Pt remains safe with 15 minute checks. Will continue POC.

## 2017-12-21 NOTE — ED Notes (Signed)
[facility]

## 2017-12-21 NOTE — [facility] Group Notes (Signed)
[facility] Group Notes:  (Nursing/MHT/Case Management/Adjunct)  Date:  12/21/2017  Time:  3:37 PM  Type of Therapy:  Nurse Education  Participation Level:  Active  Participation Quality:  Appropriate  Affect:  Appropriate  Cognitive:  Appropriate  Insight:  Appropriate  Engagement in Group:  Engaged  Modes of Intervention:  Activity  Summary of Progress/Problems:   Nurse introduced a game, called the "Ungame",  used as a form to allowing the patient s a forum to discuss feelings, talk about values and relate experiences, that are not too in depth.  Riley [name] 12/21/2017, 3:37 PM
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
## 2017-12-21 NOTE — ED Notes (Signed)
ED TO INPATIENT HANDOFF REPORT  Name/Age/Gender Riley [name] 27 y.o. male  Code Status    Code Status Orders  (From admission, onward)        Start     Ordered   12/21/17 0222  Full code  Continuous     12/21/17 0222    Code Status History    Date Active Date Inactive Code Status Order ID Comments User Context   12/13/2017 22:15 12/20/2017 00:35 Full Code [id number]  Consuello Closs, NP Inpatient   12/12/2017 05:56 12/13/2017 21:50 Full Code [id number]  OpydIlene Qua, MD ED      Home/SNF/Other Skilled nursing facility  Chief Complaint Medical clearance/BH  Level of Care/Admitting Diagnosis ED Disposition    ED Disposition Condition Comment   Transfer to Another Facility  The patient appears reasonably stabilized for transfer considering the current resources, flow, and capabilities available in the ED at this time, and I doubt any other [facility] requiring further screening and/or treatment in the ED prior to transfer is p resent.       Medical History Past Medical History:  Diagnosis Date  . Alcoholism ([place])     Allergies No Known Allergies  IV Location/Drains/Wounds Patient Lines/Drains/Airways Status   Active Line/Drains/Airways    None          Labs/Imaging Results for orders placed or performed during the hospital encounter of 12/20/17 (from the past 48 hour(s))  CBC     Status: Abnormal   Collection Time: 12/21/17  2:23 AM  Result Value Ref Range   WBC 6.8 4.0 - 10.5 K/uL   RBC 4.17 (L) 4.22 - 5.81 MIL/uL   Hemoglobin 13.9 13.0 - 17.0 g/dL   HCT 41.8 39.0 - 52.0 %   MCV 100.2 (H) 78.0 - 100.0 fL   MCH 33.3 26.0 - 34.0 pg   MCHC 33.3 30.0 - 36.0 g/dL   RDW 15.1 11.5 - 15.5 %   Platelets 348 150 - 400 K/uL    Comment: Performed at [facility], [place] [address] [place], [place] [address]  Comprehensive metabolic panel     Status: Abnormal   Collection Time: 12/21/17  2:23 AM  Result Value Ref Range   Sodium 142 135 - 145 mmol/L    Potassium 3.7 3.5 - 5.1 mmol/L   Chloride 104 101 - 111 mmol/L   CO2 28 22 - 32 mmol/L   Glucose, Bld 126 (H) 65 - 99 mg/dL   BUN 13 6 - 20 mg/dL   Creatinine, Ser 0.87 0.61 - 1.24 mg/dL   Calcium 9.2 8.9 - 10.3 mg/dL   Total Protein 6.8 6.5 - 8.1 g/dL   Albumin 3.5 3.5 - 5.0 g/dL   AST 38 15 - 41 U/L   ALT 94 (H) 17 - 63 U/L   Alkaline Phosphatase 88 38 - 126 U/L   Total Bilirubin 0.7 0.3 - 1.2 mg/dL   GFR calc non Af Amer >60 >60 mL/min   GFR calc Af Amer >60 >60 mL/min    Comment: (NOTE) The eGFR has been calculated using the CKD EPI equation. This calculation has not been validated in all clinical situations. eGFR's persistently <60 mL/min signify possible Chronic Kidney Disease.    Anion gap 10 5 - 15    Comment: Performed at [facility], [place] [address] [place], [place] [address]  Ethanol     Status: None   Collection Time: 12/21/17  2:23 AM  Result Value  Ref Range   Alcohol, Ethyl (B) <10 <10 mg/dL    Comment:        LOWEST DETECTABLE LIMIT FOR SERUM ALCOHOL IS 10 mg/dL FOR MEDICAL PURPOSES ONLY Performed at [place] [address] [place], [place] [address]   Acetaminophen level     Status: Abnormal   Collection Time: 12/21/17  2:23 AM  Result Value Ref Range   Acetaminophen (Tylenol), Serum <10 (L) 10 - 30 ug/mL    Comment:        THERAPEUTIC CONCENTRATIONS VARY SIGNIFICANTLY. A RANGE OF 10-30 ug/mL MAY BE AN EFFECTIVE CONCENTRATION FOR MANY PATIENTS. HOWEVER, SOME ARE BEST TREATED AT CONCENTRATIONS OUTSIDE THIS RANGE. ACETAMINOPHEN CONCENTRATIONS >150 ug/mL AT 4 HOURS AFTER INGESTION AND >50 ug/mL AT 12 HOURS AFTER INGESTION ARE OFTEN ASSOCIATED WITH TOXIC REACTIONS. Performed at [facility], [place] [address] [place], [place] [address]   Salicylate level     Status: None   Collection Time: 12/21/17  2:23 AM  Result Value Ref Range   Salicylate Lvl <6.3 2.8 - 30.0 mg/dL    Comment:  Performed at [facility], [place] [address], [place], [place] [address]  Rapid urine drug screen (hospital performed)     Status: Abnormal   Collection Time: 12/21/17  3:00 AM  Result Value Ref Range   Opiates NONE DETECTED NONE DETECTED   Cocaine NONE DETECTED NONE DETECTED   Benzodiazepines POSITIVE (A) NONE DETECTED   Amphetamines NONE DETECTED NONE DETECTED   Tetrahydrocannabinol NONE DETECTED NONE DETECTED   Barbiturates NONE DETECTED NONE DETECTED    Comment: (NOTE) DRUG SCREEN FOR MEDICAL PURPOSES ONLY.  IF CONFIRMATION IS NEEDED FOR ANY PURPOSE, NOTIFY LAB WITHIN 5 DAYS. LOWEST DETECTABLE LIMITS FOR URINE DRUG SCREEN Drug Class                     Cutoff (ng/mL) Amphetamine and metabolites    1000 Barbiturate and metabolites    200 Benzodiazepine                 263 Tricyclics and metabolites     300 Opiates and metabolites        300 Cocaine and metabolites        300 THC                            50 Performed at [facility], [place] [address], [place], [place] [address]    No results found.  Pending Labs Unresulted Labs (From admission, onward)   None      Vitals/Pain Today's Vitals   12/20/17 2307 12/20/17 2308 12/21/17 0302 12/21/17 0419  BP: 122/73  119/80 118/68  Pulse: (!) 106  (!) 108 84  Resp: 20   20  Temp: 97.6 F (36.4 C)   (!) 97.4 F (36.3 C)  TempSrc: Oral   Oral  SpO2: 96%   92%  PainSc:  0-No pain      Isolation Precautions No active isolations  Medications Medications  LORazepam (ATIVAN) injection 0-4 mg ( Intravenous See Alternative 12/21/17 0309)    Or  LORazepam (ATIVAN) tablet 0-4 mg (1 mg Oral Given 12/21/17 0309)  LORazepam (ATIVAN) injection 0-4 mg (not administered)    Or  LORazepam (ATIVAN) tablet 0-4 mg (not administered)  thiamine (VITAMIN B-1) tablet 100 mg (not administered)    Or  thiamine (B-1) injection 100 mg (not administered)  Mobility walks

## 2017-12-21 NOTE — Progress Notes (Signed)
[facility] from [facility]. [facility] 306-2 when medically cleared. Attending provider will be Dr. Jama Flavorsobos, MD. Reita ClicheBobby, RN made aware of acceptance once the pt is medically cleared. Call to report [phone].  Princess BruinsAquicha Lanee Chain, MSW, LCSW Therapeutic Triage Specialist  [phone]

## 2017-12-21 NOTE — ED Notes (Signed)
[facility] transporters called at [phone] line goes directly to voice mail, mailbox full unable to leave message.

## 2017-12-21 NOTE — ED Notes (Signed)
[facility] contacted for transportion

## 2017-12-21 NOTE — Tx Team (Signed)
Initial Treatment Plan 12/21/2017 7:16 AM Riley [name] RUE:[id number]    PATIENT STRESSORS: Financial difficulties Substance abuse   PATIENT STRENGTHS: Active sense of humor Average or above average intelligence   PATIENT IDENTIFIED PROBLEMS: Substance abuse  Depression  Suicidal Ideation  "Treatment and participate in that"               DISCHARGE CRITERIA:  Adequate post-discharge living arrangements Improved stabilization in mood, thinking, and/or behavior Need for constant or close observation no longer present Withdrawal symptoms are absent or subacute and managed without 24-hour nursing intervention  PRELIMINARY DISCHARGE PLAN: Attend aftercare/continuing care group Attend 12-step recovery group Placement in alternative living arrangements  PATIENT/FAMILY INVOLVEMENT: This treatment plan has been presented to and reviewed with the patient, Riley SermonRobert G [name].  The patient and family have been given the opportunity to ask questions and make suggestions.  Juliann ParesBowman, Dinero Chavira Elizabeth, RN 12/21/2017, 7:16 AM

## 2017-12-21 NOTE — ED Provider Notes (Signed)
[facility]-EMERGENCY DEPT Provider Note   CSN: [id number] Arrival date & time: 12/20/17  2240     History   Chief Complaint Chief Complaint  Patient presents with  . Medical Clearance    HPI HARLES EVETTS is a 27 y.o. male.  Patient with PMH of alcoholism presents to the ED with a chief complaint of needing medical screening for psychiatric admission.  He states that he was sent over from [facility] for clearance.  He reports that he went to [facility] today because of persistent depression and alcohol abuse since losing his mother.  He states that he drinks a fifth or a half gallon of alcohol per day.  Last drink was this morning.  Denies drug use.  Denies any physical complaints now.  Denies any other associated symptoms.  Doesn't report a clear plan for SI, but does report general SI x weeks.   The history is provided by the patient. No language interpreter was used.    Past Medical History:  Diagnosis Date  . Alcoholism [facility])     Patient Active Problem List   Diagnosis Date Noted  . MDD (major depressive disorder), severe (HCC) 12/13/2017  . Alcoholism (HCC) 12/12/2017  . Hypokalemia 12/12/2017  . Suicidal ideations 12/12/2017  . Hypoglycemia due to insulin 12/12/2017  . Alcohol abuse with intoxication (HCC) 12/12/2017  . Hypoglycemia 12/12/2017  . Insulin overdose     No past surgical history on file.     Home Medications    Prior to Admission medications   Medication Sig Start Date End Date Taking? Authorizing Provider  citalopram (CELEXA) 20 MG tablet Take 1 tablet (20 mg total) by mouth daily. For mood control 12/20/17  Yes Money, Gerlene Burdock, FNP  gabapentin (NEURONTIN) 300 MG capsule Take 1 capsule (300 mg total) by mouth 3 (three) times daily. 12/19/17  Yes Money, Gerlene Burdock, FNP  hydrOXYzine (ATARAX/VISTARIL) 50 MG tablet Take 1 tablet (50 mg total) by mouth 3 (three) times daily as needed for anxiety. 12/19/17  Yes Money, Gerlene Burdock, FNP  QUEtiapine  (SEROQUEL) 50 MG tablet Take 3 tablets (150 mg total) by mouth at bedtime. For mood control 12/19/17  Yes Money, Gerlene Burdock, FNP  QUEtiapine (SEROQUEL) 50 MG tablet Take 1 tablet (50 mg total) by mouth 2 (two) times daily. 12/19/17  Yes Money, Gerlene Burdock, FNP    Family History Family History  Family history unknown: Yes    Social History Social History   Tobacco Use  . Smoking status: Current Every Day Smoker    Packs/day: 2.00    Types: Cigarettes  . Smokeless tobacco: Never Used  Substance Use Topics  . Alcohol use: Yes    Comment: Pt stated "I drink  1/2 gal of 100 proof every day"  . Drug use: No     Allergies   Patient has no known allergies.   Review of Systems Review of Systems  All other systems reviewed and are negative.    Physical Exam Updated Vital Signs BP 122/73 (BP Location: Left Arm)   Pulse (!) 106   Temp 97.6 F (36.4 C) (Oral)   Resp 20   SpO2 96%   Physical Exam  Constitutional: He is oriented to person, place, and time. He appears well-developed and well-nourished.  HENT:  Head: Normocephalic and atraumatic.  Eyes: Conjunctivae and EOM are normal. Pupils are equal, round, and reactive to light. Right eye exhibits no discharge. Left eye exhibits no discharge. No scleral icterus.  Neck: Normal range of motion. Neck supple. No JVD present.  Cardiovascular: Normal rate, regular rhythm and normal heart sounds. Exam reveals no gallop and no friction rub.  No murmur heard. Pulmonary/Chest: Effort normal and breath sounds normal. No respiratory distress. He has no wheezes. He has no rales. He exhibits no tenderness.  Abdominal: Soft. He exhibits no distension and no mass. There is no tenderness. There is no rebound and no guarding.  Musculoskeletal: Normal range of motion. He exhibits no edema or tenderness.  Neurological: He is alert and oriented to person, place, and time.  Skin: Skin is warm and dry.  Psychiatric: He has a normal mood and affect. His  behavior is normal. Judgment and thought content normal.  Nursing note and vitals reviewed.    ED Treatments / Results  Labs (all labs ordered are listed, but only abnormal results are displayed) Labs Reviewed - No data to display  EKG  EKG Interpretation None       Radiology No results found.  Procedures Procedures (including critical care time)  Medications Ordered in ED Medications  LORazepam (ATIVAN) injection 0-4 mg (not administered)    Or  LORazepam (ATIVAN) tablet 0-4 mg (not administered)  LORazepam (ATIVAN) injection 0-4 mg (not administered)    Or  LORazepam (ATIVAN) tablet 0-4 mg (not administered)  thiamine (VITAMIN B-1) tablet 100 mg (not administered)    Or  thiamine (B-1) injection 100 mg (not administered)     Initial Impression / Assessment and Plan / ED Course  I have reviewed the triage vital signs and the nursing notes.  Pertinent labs & imaging results that were available during my care of the patient were reviewed by me and considered in my medical decision making (see chart for details).     Patient here for SI and depression and alcohol abuse.  Will check labs.    On CIWA.   Medically clear.  Final Clinical Impressions(s) / ED Diagnoses   Final diagnoses:  Alcohol abuse  Depression, unspecified depression type  Suicidal ideation    ED Discharge Orders    None       Roxy HorsemanBrowning, Placido, PA-C 12/21/17 [id number]    Linwood DibblesKnapp, Jon, MD 12/21/17 [phone]

## 2017-12-21 NOTE — Progress Notes (Signed)
Patient ID: Riley SermonRobert G [name], male   DOB: 10/27/1990, 27 y.o.   MRN: [id number]  D: Patient alert and cooperative. Pt detoxing from EOTH  c/o multiply withdrawal symptoms. Pt reports he is tolerating medication well. Pt mood/affect is anxious. Pt attended and participated in evening AA group. Pt denies SI/HI/AVH. No acute distressed noted at this time.   A: Medications administered as prescribed. Emotional support and encouragement given and will continue to monitor pt's progress for stabilization.  R: Patient remains safe and complaint with medications.
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
## 2017-12-21 NOTE — ED Triage Notes (Deleted)
Pt reports needing detox from ETOH and having suicidal thoughts

## 2017-12-21 NOTE — Progress Notes (Signed)
Admission note:  Pt is a 27 year old Caucasian male admitted to the services of Dr. Jama Flavorsobos for suicidal ideation, depression, alcohol abuse.  Pt is cooperative with admission process.  Pt contracts for safety on the unit.  Pt was just discharged on Feb 28 and [facility].  Pt then began drinking again.  Pt's belongings were searched and Pt was brought onto unit.  Complaint of withdrawal but Pt did want to go to breakfast.

## 2017-12-22 DIAGNOSIS — F419 Anxiety disorder, unspecified: Secondary | ICD-10-CM

## 2017-12-22 DIAGNOSIS — G4700 Insomnia, unspecified: Secondary | ICD-10-CM

## 2017-12-22 DIAGNOSIS — F10239 Alcohol dependence with withdrawal, unspecified: Secondary | ICD-10-CM

## 2017-12-22 DIAGNOSIS — F17210 Nicotine dependence, cigarettes, uncomplicated: Secondary | ICD-10-CM

## 2017-12-22 DIAGNOSIS — R45851 Suicidal ideations: Secondary | ICD-10-CM

## 2017-12-22 DIAGNOSIS — R450 Nervousness: Secondary | ICD-10-CM

## 2017-12-22 DIAGNOSIS — F332 Major depressive disorder, recurrent severe without psychotic features: Principal | ICD-10-CM

## 2017-12-22 MED ORDER — ENSURE ENLIVE PO LIQD
237.0000 mL | Freq: Two times a day (BID) | ORAL | Status: DC
  Administered 2017-12-23: 237 mL via ORAL

## 2017-12-22 MED ORDER — QUETIAPINE FUMARATE 200 MG PO TABS
200.0000 mg | ORAL_TABLET | Freq: Every day | ORAL | Status: DC
  Administered 2017-12-22 – 2017-12-23 (×2): 200 mg via ORAL
  Filled 2017-12-22 (×2): qty 7
  Filled 2017-12-22 (×2): qty 1

## 2017-12-22 MED ORDER — QUETIAPINE FUMARATE 50 MG PO TABS
50.0000 mg | ORAL_TABLET | Freq: Two times a day (BID) | ORAL | Status: DC
  Administered 2017-12-22 – 2017-12-23 (×3): 50 mg via ORAL
  Filled 2017-12-22: qty 1
  Filled 2017-12-22 (×2): qty 14
  Filled 2017-12-22 (×2): qty 1
  Filled 2017-12-22: qty 14
  Filled 2017-12-22: qty 1

## 2017-12-22 NOTE — Progress Notes (Signed)
[facility] MD Progress Note  12/22/2017 1:27 PM Riley [name]  MRN:  [id number]   Subjective:  Patient reports " I guess every things is going okay, all my same medications was restarted but can I get Ativan for my anxiety or a increase with my Seroquel?"   Objective:.Riley [name] is awake, alert and oriented.  found attending group session.  Denies suicidal or homicidal ideation. Denies auditory or visual hallucination and does not appear to be responding to internal stimuli. Patient reports he gets really anxious during the day and is requesting medication increase or to be started on Ativan. Patient to continue attending group sessions with participation. Encouraged using coping skills.  Increased Seroquel 150 mg to 200 mg- pending EKG. Support, encouragement and reassurance was provided.   Principal Problem: Severe recurrent major depression without psychotic features ([place]) Diagnosis:   Patient Active Problem List   Diagnosis Date Noted  . Severe recurrent major depression without psychotic features ([place]) [F33.2] 12/21/2017  . MDD (major depressive disorder), severe ([place]) [F32.2] 12/13/2017  . Alcoholism ([place]) [F10.20] 12/12/2017  . Hypokalemia [E87.6] 12/12/2017  . Suicidal ideations [R45.851] 12/12/2017  . Hypoglycemia due to insulin [E16.0, T38.3X5A] 12/12/2017  . Alcohol abuse with intoxication ([place]) [F10.129] 12/12/2017  . Hypoglycemia [E16.2] 12/12/2017  . Insulin overdose [T38.3X1A]    Total Time spent with patient: 30 minutes  Past Psychiatric History: See H&P  Past Medical History:  Past Medical History:  Diagnosis Date  . Alcoholism ([place])    History reviewed. No pertinent surgical history. Family History:  Family History  Family history unknown: Yes   Family Psychiatric  History: See H&P Social History:  Social History   Substance and Sexual Activity  Alcohol Use Yes   Comment: Pt stated "I drink  1/2 gal of 100 proof every day"     Social History   Substance and  Sexual Activity  Drug Use No    Social History   Socioeconomic History  . Marital status: Single    Spouse name: None  . Number of children: None  . Years of education: None  . Highest education level: None  Social Needs  . Financial resource strain: None  . Food insecurity - worry: None  . Food insecurity - inability: None  . Transportation needs - medical: None  . Transportation needs - non-medical: None  Occupational History  . None  Tobacco Use  . Smoking status: Current Every Day Smoker    Packs/day: 2.00    Types: Cigarettes  . Smokeless tobacco: Never Used  Substance and Sexual Activity  . Alcohol use: Yes    Comment: Pt stated "I drink  1/2 gal of 100 proof every day"  . Drug use: No  . Sexual activity: Not Currently  Other Topics Concern  . None  Social History Narrative  . None   Additional Social History:        Sleep: Fair  Appetite:  Good  Current Medications: Current Facility-Administered Medications  Medication Dose Route Frequency Provider Last Rate Last Dose  . acetaminophen (TYLENOL) tablet 650 mg  650 mg Oral Q6H PRN Lindon Romp A, NP      . alum & mag hydroxide-simeth (MAALOX/MYLANTA) 200-200-20 MG/5ML suspension 30 mL  30 mL Oral Q4H PRN Lindon Romp A, NP      . citalopram (CELEXA) tablet 20 mg  20 mg Oral Daily Derrill Center, NP   20 mg at 12/22/17 0834  . feeding supplement (ENSURE ENLIVE) (ENSURE ENLIVE)  liquid 237 mL  237 mL Oral BID BM Cobos, Fernando A, MD      . gabapentin (NEURONTIN) capsule 300 mg  300 mg Oral TID Derrill Center, NP   300 mg at 12/22/17 1154  . hydrOXYzine (ATARAX/VISTARIL) tablet 25 mg  25 mg Oral Q6H PRN Lindon Romp A, NP   25 mg at 12/21/17 0831  . hydrOXYzine (ATARAX/VISTARIL) tablet 50 mg  50 mg Oral TID PRN Derrill Center, NP   50 mg at 12/22/17 0837  . loperamide (IMODIUM) capsule 2-4 mg  2-4 mg Oral PRN Lindon Romp A, NP      . LORazepam (ATIVAN) tablet 1 mg  1 mg Oral Q6H PRN Lindon Romp A, NP   1 mg  at 12/21/17 2144  . magnesium hydroxide (MILK OF MAGNESIA) suspension 30 mL  30 mL Oral Daily PRN Lindon Romp A, NP      . nicotine polacrilex (NICORETTE) gum 2 mg  2 mg Oral PRN Cobos, Myer Peer, MD   2 mg at 12/22/17 1154  . ondansetron (ZOFRAN-ODT) disintegrating tablet 4 mg  4 mg Oral Q6H PRN Lindon Romp A, NP      . QUEtiapine (SEROQUEL) tablet 200 mg  200 mg Oral QHS Derrill Center, NP       And  . QUEtiapine (SEROQUEL) tablet 50 mg  50 mg Oral BID Derrill Center, NP      . traZODone (DESYREL) tablet 50 mg  50 mg Oral QHS PRN Rozetta Nunnery, NP        Lab Results:  Results for orders placed or performed during the hospital encounter of 12/20/17 (from the past 48 hour(s))  CBC     Status: Abnormal   Collection Time: 12/21/17  2:23 AM  Result Value Ref Range   WBC 6.8 4.0 - 10.5 K/uL   RBC 4.17 (L) 4.22 - 5.81 MIL/uL   Hemoglobin 13.9 13.0 - 17.0 g/dL   HCT 41.8 39.0 - 52.0 %   MCV 100.2 (H) 78.0 - 100.0 fL   MCH 33.3 26.0 - 34.0 pg   MCHC 33.3 30.0 - 36.0 g/dL   RDW 15.1 11.5 - 15.5 %   Platelets 348 150 - 400 K/uL    Comment: Performed at [facility], [place] [address] [place], [place] [address]  Comprehensive metabolic panel     Status: Abnormal   Collection Time: 12/21/17  2:23 AM  Result Value Ref Range   Sodium 142 135 - 145 mmol/L   Potassium 3.7 3.5 - 5.1 mmol/L   Chloride 104 101 - 111 mmol/L   CO2 28 22 - 32 mmol/L   Glucose, Bld 126 (H) 65 - 99 mg/dL   BUN 13 6 - 20 mg/dL   Creatinine, Ser 0.87 0.61 - 1.24 mg/dL   Calcium 9.2 8.9 - 10.3 mg/dL   Total Protein 6.8 6.5 - 8.1 g/dL   Albumin 3.5 3.5 - 5.0 g/dL   AST 38 15 - 41 U/L   ALT 94 (H) 17 - 63 U/L   Alkaline Phosphatase 88 38 - 126 U/L   Total Bilirubin 0.7 0.3 - 1.2 mg/dL   GFR calc non Af Amer >60 >60 mL/min   GFR calc Af Amer >60 >60 mL/min    Comment: (NOTE) The eGFR has been calculated using the CKD EPI equation. This calculation has not been validated in all clinical  situations. eGFR's persistently <60 mL/min signify possible Chronic Kidney Disease.  Anion gap 10 5 - 15    Comment: Performed at [facility], [place] [address] [place], [place] [address]  Ethanol     Status: None   Collection Time: 12/21/17  2:23 AM  Result Value Ref Range   Alcohol, Ethyl (B) <10 <10 mg/dL    Comment:        LOWEST DETECTABLE LIMIT FOR SERUM ALCOHOL IS 10 mg/dL FOR MEDICAL PURPOSES ONLY Performed at [place] [address], [place], [place] [address]   Acetaminophen level     Status: Abnormal   Collection Time: 12/21/17  2:23 AM  Result Value Ref Range   Acetaminophen (Tylenol), Serum <10 (L) 10 - 30 ug/mL    Comment:        THERAPEUTIC CONCENTRATIONS VARY SIGNIFICANTLY. A RANGE OF 10-30 ug/mL MAY BE AN EFFECTIVE CONCENTRATION FOR MANY PATIENTS. HOWEVER, SOME ARE BEST TREATED AT CONCENTRATIONS OUTSIDE THIS RANGE. ACETAMINOPHEN CONCENTRATIONS >150 ug/mL AT 4 HOURS AFTER INGESTION AND >50 ug/mL AT 12 HOURS AFTER INGESTION ARE OFTEN ASSOCIATED WITH TOXIC REACTIONS. Performed at [facility], [place] [address] [place], [place] [address]   Salicylate level     Status: None   Collection Time: 12/21/17  2:23 AM  Result Value Ref Range   Salicylate Lvl <2.3 2.8 - 30.0 mg/dL    Comment: Performed at [facility], [place] [name] [place], [place] [address]  Rapid urine drug screen (hospital performed)     Status: Abnormal   Collection Time: 12/21/17  3:00 AM  Result Value Ref Range   Opiates NONE DETECTED NONE DETECTED   Cocaine NONE DETECTED NONE DETECTED   Benzodiazepines POSITIVE (A) NONE DETECTED   Amphetamines NONE DETECTED NONE DETECTED   Tetrahydrocannabinol NONE DETECTED NONE DETECTED   Barbiturates NONE DETECTED NONE DETECTED    Comment: (NOTE) DRUG SCREEN FOR MEDICAL PURPOSES ONLY.  IF CONFIRMATION IS NEEDED FOR ANY PURPOSE, NOTIFY LAB WITHIN 5 DAYS. LOWEST  DETECTABLE LIMITS FOR URINE DRUG SCREEN Drug Class                     Cutoff (ng/mL) Amphetamine and metabolites    1000 Barbiturate and metabolites    200 Benzodiazepine                 202 Tricyclics and metabolites     300 Opiates and metabolites        300 Cocaine and metabolites        300 THC                            50 Performed at [facility], [place] [address] [place], [place] [address]     Blood Alcohol level:  Lab Results  Component Value Date   ETH <10 12/21/2017   ETH 345 (HH) 62/37/6283    Metabolic Disorder Labs: No results found for: HGBA1C, MPG No results found for: PROLACTIN No results found for: CHOL, TRIG, HDL, CHOLHDL, VLDL, LDLCALC  Physical Findings: AIMS:  , ,  ,  ,    CIWA:  CIWA-Ar Total: 10 COWS:     Musculoskeletal: Strength & Muscle Tone: within normal limits Gait & Station: normal Patient leans: N/A  Psychiatric Specialty Exam: Physical Exam  Nursing note and vitals reviewed. Constitutional: He is oriented to person, place, and time. He appears well-developed and well-nourished.  Respiratory: Effort normal.  Musculoskeletal: Normal range of  motion.  Neurological: He is alert and oriented to person, place, and time.  Skin: Skin is warm.    Review of Systems  Constitutional: Negative.   HENT: Negative.   Eyes: Negative.   Respiratory: Negative.   Cardiovascular: Negative.   Gastrointestinal: Negative.   Genitourinary: Negative.   Musculoskeletal: Negative.   Skin: Negative.   Neurological: Negative.   Endo/Heme/Allergies: Negative.   Psychiatric/Behavioral: Positive for depression, substance abuse and suicidal ideas. Negative for hallucinations. The patient is nervous/anxious and has insomnia.     Blood pressure (!) 142/99, pulse (!) 112, temperature 98.1 F (36.7 C), temperature source Oral, resp. rate 16, height _0  (1.702 m), weight 71.7 kg (158 lb).Body mass index is 24.75 kg/m.  General Appearance:  Casual  Eye Contact:  Good  Speech:  Clear and Coherent and Normal Rate  Volume:  Increased  Mood:  Irritable  Affect:  Flat  Thought Process:  Goal Directed and Descriptions of Associations: Intact  Orientation:  Full (Time, Place, and Person)  Thought Content:  WDL  Suicidal Thoughts:  Yes.  without intent/plan intermittent, patient is able to contract for safety while on the unit  Homicidal Thoughts:  No  Memory:  Immediate;   Good Recent;   Good Remote;   Good  Judgement:  Fair  Insight:  Fair  Psychomotor Activity:  Normal  Concentration:  Concentration: Good and Attention Span: Good  Recall:  Good  Fund of Knowledge:  Good  Language:  Good  Akathisia:  No  Handed:  Right  AIMS (if indicated):     Assets:  Communication Skills Desire for Improvement Financial Resources/Insurance Housing Physical Health Social Support Transportation  ADL's:  Intact  Cognition:  WNL  Sleep:  Number of Hours: 6   Problems Addressed: MDD severe Alcoholism  Treatment Plan Summary: Daily contact with patient to assess and evaluate symptoms and progress in treatment, Medication management and Plan is to:   Continue with current treatment plan on 12/22/2017 except where noted  -Increase Seroquel 150 to 200 mg PO QHS for mood stability- Pending EKG results   -Continue Seroquel 50 mg PO BID for agitation and mood stability -Continue Gabapentin 300 mg PO TID for withdrawal symptoms  -Continue  Vistaril 50 mg PO Q6H PRN for anxiety -Continue Celexa 20 mg PO Daily for mood stability  - CSW to continue with discharge disposition -Encourage group therapy participation  Derrill Center, NP 12/22/2017, 1:27 PM

## 2017-12-22 NOTE — Progress Notes (Signed)
D. Pt presents with an anxious affect and mood- continues to complain of withdrawal symptoms (anxiety and tremors)  stating, "I feel jumpy inside". Pt did not attend morning groups but was observed attending afternoon nurse led group. Pt currently denies SI/HI and AVH  A. Labs and vitals monitored. Pt compliant with medications. Pt supported emotionally and encouraged to express concerns and ask questions.   R. Pt remains safe with 15 minute checks. Will continue POC.

## 2017-12-22 NOTE — Progress Notes (Signed)
Patient did attend the evening speaker AA meeting.  

## 2017-12-22 NOTE — Progress Notes (Signed)
NUTRITION ASSESSMENT  Pt identified as at risk on the Malnutrition Screen Tool  INTERVENTION: 1. Supplements: Provide Ensure Enlive po BID, each supplement provides 350 kcal and 20 grams of protein  NUTRITION DIAGNOSIS: Unintentional weight loss related to sub-optimal intake as evidenced by pt report.   Goal: Pt to meet >/= 90% of their estimated nutrition needs.  Monitor:  PO intake  Assessment:  Pt admitted with depression and ETOH abuse. Pt was just assessed by nutrition [facility] Pt has continued to drink ETOH. Pt with 8 lb of weight gain since that admission. Will continue supplements that were ordered at that time.   Height: Ht Readings from Last 1 Encounters:  12/21/17 5\' 7"  (1.702 m)    Weight: Wt Readings from Last 1 Encounters:  12/21/17 158 lb (71.7 kg)    Weight Hx: Wt Readings from Last 10 Encounters:  12/21/17 158 lb (71.7 kg)  12/13/17 150 lb 8 oz (68.3 kg)  12/12/17 130 lb (59 kg)  11/30/17 175 lb (79.4 kg)  10/25/17 175 lb (79.4 kg)  10/24/17 173 lb (78.5 kg)  04/22/17 150 lb (68 kg)  10/24/16 160 lb (72.6 kg)  09/17/16 160 lb (72.6 kg)  01/16/16 160 lb (72.6 kg)    BMI:  Body mass index is 24.75 kg/m. Pt meets criteria for normal based on current BMI.  Estimated Nutritional Needs: Kcal: 25-30 kcal/kg Protein: > 1 gram protein/kg Fluid: 1 ml/kcal  Diet Order: Diet regular Room service appropriate? Yes; Fluid consistency: Thin Pt is also offered choice of unit snacks mid-morning and mid-afternoon.  Pt is eating as desired.   Lab results and medications reviewed.   Tilda FrancoLindsey Lashundra Shiveley, MS, RD, LDN [facility] Clinical Dietitian Pager: [phone] After Hours Pager: [phone]

## 2017-12-22 NOTE — [facility] Group Notes (Signed)
[facility] LCSW Group Therapy Note  Date/Time:  12/22/2017 10:00-11:00AM  Type of Therapy and Topic:  Group Therapy:  Healthy and Unhealthy Supports  Participation Level:  Did Not Attend   Description of Group:  Patients in this group were introduced to the idea of adding a variety of healthy supports to address the various needs in their lives.Patients discussed what additional healthy supports could be helpful in their recovery and wellness after discharge in order to prevent future hospitalizations.   An emphasis was placed on using counselor, doctor, therapy groups, 12-step groups, and problem-specific support groups to expand supports.  They also worked as a group on developing a specific plan for several patients to deal with unhealthy supports through boundary-setting, psychoeducation with loved ones, and even termination of relationships.   Therapeutic Goals:   1)  discuss importance of adding supports to stay well once out of the hospital  2)  compare healthy versus unhealthy supports and identify some examples of each  3)  generate ideas and descriptions of healthy supports that can be added  4)  offer mutual support about how to address unhealthy supports  5)  encourage active participation in and adherence to discharge plan    Summary of Patient Progress:  N/A   Therapeutic Modalities:   Motivational Interviewing Brief Solution-Focused Therapy  Marquee Fuchs Grossman-Orr, LCSW        

## 2017-12-22 NOTE — [facility] Group Notes (Signed)
Pt was invited but did not attend orientation/goals group. 

## 2017-12-23 MED ORDER — TRAZODONE HCL 50 MG PO TABS: 50.0000 mg | ORAL_TABLET | Freq: Every evening | ORAL | 0 refills | Status: DC | PRN

## 2017-12-23 MED ORDER — QUETIAPINE FUMARATE 50 MG PO TABS: 50.0000 mg | ORAL_TABLET | Freq: Two times a day (BID) | ORAL | 0 refills | Status: DC

## 2017-12-23 MED ORDER — QUETIAPINE FUMARATE 200 MG PO TABS: 200.0000 mg | ORAL_TABLET | Freq: Every day | ORAL | 0 refills | Status: DC

## 2017-12-23 MED ORDER — HYDROXYZINE HCL 50 MG PO TABS: 50.0000 mg | ORAL_TABLET | Freq: Three times a day (TID) | ORAL | 0 refills | Status: DC | PRN

## 2017-12-23 MED ORDER — GABAPENTIN 300 MG PO CAPS: 300.0000 mg | ORAL_CAPSULE | Freq: Three times a day (TID) | ORAL | 0 refills | Status: DC

## 2017-12-23 MED ORDER — CITALOPRAM HYDROBROMIDE 20 MG PO TABS: 20.0000 mg | ORAL_TABLET | Freq: Every day | ORAL | 0 refills | Status: DC

## 2017-12-23 NOTE — Progress Notes (Signed)
Recreation Therapy Notes  Date: 12/23/17 Time: 0930 Location: [address] Dayroom  Group Topic: Stress Management  Goal Area(s) Addresses:  Patient will verbalize importance of using healthy stress management.  Patient will identify positive emotions associated with healthy stress management.   Intervention: Stress Management  Activity :  Guided Imagery.  LRT introduced the stress management technique of guided imagery.  LRT read a script that allowed patients to envision being on the beach.  Patients were to follow along as the script was read to engage in the activity.  Education:  Stress Management, Discharge Planning.   Education Outcome: Acknowledges edcuation/In group clarification offered/Needs additional education  Clinical Observations/Feedback: Pt did not attend group.     Caroll RancherMarjette Justino Boze, LRT/CTRS         Lillia AbedLindsay, Namiah Dunnavant A 12/23/2017 11:06 AM
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
## 2017-12-23 NOTE — Progress Notes (Signed)
D.  Pt pleasant on approach, denies complaints at this time.  Pt was positive for evening AA group, observed engaged in appropriate interaction with peers on the unit.  Pt denies SI/HI/AVH at this time.  A.  Support and encouragement offered, medication given as ordered.  R.  Pt remains safe on the unit, will continue to monitor.   

## 2017-12-23 NOTE — [facility]HH Suicide Risk Assessment (Signed)
[facility] INPATIENT:  Family/Significant Other Suicide Prevention Education  Suicide Prevention Education:  Patient Refusal for Family/Significant Other Suicide Prevention Education: The patient Riley SermonRobert G [name] has refused to provide written consent for family/significant other to be provided Family/Significant Other Suicide Prevention Education during admission and/or prior to discharge.  Physician notified.  SPE completed with pt, as pt refused to consent to family contact. SPI pamphlet provided to pt and pt was encouraged to share information with support network, ask questions, and talk about any concerns relating to SPE. Pt denies access to guns/firearms and verbalized understanding of information provided. Mobile Crisis information also provided to pt.   Aemilia Dedrick N Smart LCSW 12/23/2017, 10:53 AM

## 2017-12-23 NOTE — Progress Notes (Signed)
D: Patient observed visible in the dayroom, social with peers. Patient states he is anxious this AM and is hoping [facility]. "I just left too soon." Patient's affect anxious with congruent mood. Pleasant and cooperative. Per self inventory and discussions with writer, rates depression, hopelessness and anxiety all at an 8/10. Rates sleep as fair, appetite as fair, energy as normal and concentration as good.  States goal for today is "recovery and prepare for [facility], focus." Denies pain, physical complaints.   A: Medicated per orders, prn vistaril given for complaint of anxiety. Level III obs in place for safety. Emotional support offered and self inventory reviewed. Encouraged completion of Suicide Safety Plan and programming participation. Discussed POC with MD, SW.    R: Patient verbalizes understanding of POC. On reassess, patient is asleep. Patient initially endorsed passive SI however states, "I just need help and a treatment when I leave." Once informed of [facility] appt for tomorrow patient stated to this writer, "I feel safe with that plan. I won't hurt myself. I'm just afraid of relapse. I will be safe there. It was just the thought of being sent out without additional treatment." Denies HI/AVH and remains safe on level III obs. Will continue to monitor closely and make verbal contact frequently.

## 2017-12-23 NOTE — BHH Group Notes (Signed)
Adult Psychoeducational Group Note  Date:  12/23/2017 Time:  5:42 PM  Group Topic/Focus:  Personal Choices and Values:   The focus of this group is to help patients assess and explore the importance of values in their lives, how their values affect their decisions, how they express their values and what opposes their expression.  Participation Level:  Active  Participation Quality:  Appropriate  Affect:  Appropriate  Cognitive:  Alert and Appropriate  Insight: Appropriate and Good  Engagement in Group:  Engaged  Modes of Intervention:  Discussion and Exploration  Additional Comments:  Patient attended and actively participated in group.  Patient was asked to read a poem dealing with recovery and discuss how that poem applied to their current situation.  They were then asked to identified barriers to change and how they would handle challenges once they were discharged to keep them on the "right path".    [name] P 12/23/2017, 5:42 PM 

## 2017-12-23 NOTE — [facility] Suicide Risk Assessment (Signed)
[facility] Discharge Suicide Risk Assessment   Principal Problem: Severe recurrent major depression without psychotic features [facility]) Discharge Diagnoses: Substance Induced Mood Disorder Patient Active Problem List   Diagnosis Date Noted  . Severe recurrent major depression without psychotic features ([place]) [F33.2] 12/21/2017  . MDD (major depressive disorder), severe ([place]) [F32.2] 12/13/2017  . Alcoholism ([place]) [F10.20] 12/12/2017  . Hypokalemia [E87.6] 12/12/2017  . Suicidal ideations [R45.851] 12/12/2017  . Hypoglycemia due to insulin [E16.0, T38.3X5A] 12/12/2017  . Alcohol abuse with intoxication ([place]) [F10.129] 12/12/2017  . Hypoglycemia [E16.2] 12/12/2017  . Insulin overdose [T38.3X1A]     Total Time spent with patient: 45 minutes  Musculoskeletal: Strength & Muscle Tone: within normal limits Gait & Station: normal Patient leans: N/A  Psychiatric Specialty Exam: Review of Systems  Constitutional: Negative.   HENT: Negative.   Eyes: Negative.   Respiratory: Negative.   Cardiovascular: Negative.   Gastrointestinal: Negative.   Genitourinary: Negative.   Musculoskeletal: Negative.   Skin: Negative.   Neurological: Negative.   Endo/Heme/Allergies: Negative.   Psychiatric/Behavioral: Negative for depression, hallucinations, memory loss, substance abuse and suicidal ideas. The patient is not nervous/anxious and does not have insomnia.     Blood pressure (!) 141/92, pulse (!) 115, temperature 98.1 F (36.7 C), temperature source Oral, resp. rate 16, height _0  (1.702 m), weight 71.7 kg (158 lb).Body mass index is 24.75 kg/m.  General Appearance: Neatly dressed, pleasant, engaging well and cooperative. Appropriate behavior. Not in any distress. Good relatedness. Not internally stimulated.  Eye Contact::  Good  Speech:  Spontaneous, normal prosody. Normal tone and rate.   Volume:  Normal  Mood:  Euthymic  Affect:  Appropriate and Full Range  Thought Process:  Linear   Orientation:  Full (Time, Place, and Person)  Thought Content:  No delusional theme. No preoccupation with violent thoughts. No negative ruminations. No obsession.  No hallucination in any modality.   Suicidal Thoughts:  No  Homicidal Thoughts:  No  Memory:  Immediate;   Good Recent;   Good Remote;   Good  Judgement:  Good  Insight:  Good  Psychomotor Activity:  Normal  Concentration:  Good  Recall:  Good  Fund of Knowledge:Good  Language: Good  Akathisia:  Negative  Handed:    AIMS (if indicated):     Assets:  Communication Skills Desire for Improvement Physical Health Resilience  Sleep:  Number of Hours: 5.5  Cognition: WNL  ADL's:  Intact   Clinical Assessment::   27 y.o Caucasian male, single, unemployed, homeless, on parole for sexual charges. Background history of AUD. Recently discharged from our unit. Represented in company of a friend. Expressed suicidal thoughts. No new stressor. UDS is positive for benzodiazepines. No alcohol. Patient states that he just could not cope out there. Says suicidal thoughts came back. Feels he needs to be in a safe environment until he gets into [facility].  We have agreed to recommence medications he was recently discharged on  Chart reviewed today. Patient discussed at team today. Patient was scheduled to be discharged to [facility] tomorrow. Plans feel through because they do not have any bed. Intake for him has been rescheduled for Tuesday next week. Patient feels there is no point staying here till then. He has been anxious to get discharged. Staff states that he has been pleasant. He has been interacting well with peers. He has not expressed any suicidal thoughts. He has not expressed any thoughts of violence. Sleep wake cycle is normal.  I met with  him for the first time today. Patient tells me that he was intoxicated and acted impulsively. Says it was his friends insulin that he injected. Says he felt funny and woke his friend up. Says he is  glad to be alive. He never planned this. No act to avoid being detected. No research into the effects of insulin. Tells me that he has never attempted suicide before that. Says he has no desire to try that again. Repeatedly tells me that he is not suicidal. Says he plans to get his belongings from his friends place and check into the shelter. He plans to get into [facility] next Tuesday. Reports that he is in good spirits. He is not feeling depressed. Reports normal energy and interest. He has been maintaining normal biological functions. He is able to think clearly. He is able to focus on task. His thoughts are not crowded or racing. No evidence of mania. No hallucination in any modality. He is not making any delusional statement. No passivity of will/thought. He is fully in touch with reality. No thoughts of suicide. No thoughts of homicide. No violent thoughts. No overwhelming anxiety. No access to weapons. No craving for substances. Says all he wants to do is smoke.  Denies any new stressors.      Demographic Factors:  Low socioeconomic status and Unemployed  Loss Factors: Legal issues  Historical Factors: Impulsivity  Risk Reduction Factors:   Positive social support, Positive therapeutic relationship and Positive coping skills or problem solving skills  Continued Clinical Symptoms:  As above   Cognitive Features That Contribute To Risk:  None    Suicide Risk:  Minimal: No identifiable suicidal ideation.  Patient is not having any thoughts of suicide at this time. Modifiable risk factors targeted during this admission includes depression, impulsivity and substance use. Demographical and historical risk factors cannot be modified. Patient is now engaging well. Patient is reliable and is future oriented. We have buffered patient's support structures. At this point, patient is at low risk of suicide. Patient is aware of the effects of psychoactive substances on decision making process. Patient  has been provided with emergency contacts. Patient acknowledges to use resources provided if unforseen circumstances changes their current risk stratification.    Follow-up Information    Services, [facility] Follow up on 12/24/2017.   Why:  Screening for possible admission on Tuesday, 12/24/17 at 8:00AM. Please arrive by 7:45AM and bring: proof of [place] residency, medications/prescriptions provided by hospital, and clothing. Thank you.  Contact information: [place] [address] [phone]        [facility] Follow up.   Specialty:  Behavioral Health Why:  Walk in within 3 days of hospital/rehab discharge to be assessed for outpatient mental health services including medication management and counseling. Walk in hours: Mon-Fri 8am-9am. Thank you.  Contact information: [place] [address] [phone]           Plan Of Care/Follow-up recommendations:  1. Continue current psychotropic medications 2. Mental health and addiction follow up as arranged.  3. Provided limited quantity of prescriptions   Artist Beach, MD 12/23/2017, 4:43 PM

## 2017-12-23 NOTE — Progress Notes (Signed)
  [facility] Discharge [facility] :  Will you be returning to the same living situation after discharge:  No. Pt has screening [facility].  At discharge, do you have transportation home?: Yes,  taxi voucher in chart. PATIENT MUST DISCHARGE NO LATER THAN 7AM TO MAKE 8AM [facility] SCREENING ON TUESDAY, 3/5 Do you have the ability to pay for your medications: Yes,  mental health  Release of information consent forms completed and submitted to medical records by CSW.  Patient to Follow up at: Follow-up Information    Services, [facility] Follow up on 12/24/2017.   Why:  Screening for possible admission on Tuesday, 12/24/17 at 8:00AM. Please arrive by 7:45AM and bring: proof of [place] residency, medications/prescriptions provided by hospital, and clothing. Thank you.  Contact information: [address] [place] [address] [phone]        [facility] Follow up.   Specialty:  Behavioral Health Why:  Walk in within 3 days of hospital/rehab discharge to be assessed for outpatient mental health services including medication management and counseling. Walk in hours: Mon-Fri 8am-9am. Thank you.  Contact information: [address] [place] [address] [phone]           Next level of care provider has access to [facility] Link:no  Safety Planning and Suicide Prevention discussed: Yes,  SPE completed with pt; pt declined to consent to family contact. SPI pamphlet and Mobile Crisis information provided.  Have you used any form of tobacco in the last 30 days? (Cigarettes, Smokeless Tobacco, Cigars, and/or Pipes): Yes  Has patient been referred to the Quitline?: Patient refused referral  Patient has been referred for addiction treatment: Yes  Pulte HomesHeather N Smart, LCSW 12/23/2017, 10:54 AM

## 2017-12-23 NOTE — Progress Notes (Signed)
Patient became upset with this author at the outset of this shift. The patient had asked about having the staff go to the cafeteria to get him a soda and was informed by this Thereasa Parkinauthor that he could not comply. This Thereasa Parkinauthor offered the patient either regular or diet ginger ale but he refused. He threatened to do something to the unit. A staff member left the hallway and gave the patient his drink.
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
## 2017-12-23 NOTE — Progress Notes (Signed)
Patient came to NS clearly agitated, angry. States he just hung up with PO who is claiming [place]. Patient adamant he did not leave the state. "I'm going to go crazy. I'm so angry." Processed 1:1 with patient and provided emotional support. Reminded patient that he will have proof of whereabouts as monitor has GPS. Patient verbalized understanding and was calmer on reassessment, after time spent with Clinical research associatewriter. [facility]. Report and materials given to [facility] who will review discharge plan, AVS with patient.

## 2017-12-23 NOTE — Tx Team (Signed)
Interdisciplinary Treatment and Diagnostic Plan Update  12/23/2017 Time of Session: [id number] Riley [name] MRN: [id number]  Principal Diagnosis: Severe recurrent major depression without psychotic features [facility])  Secondary Diagnoses: Principal Problem:   Severe recurrent major depression without psychotic features (HCC)   Current Medications:  Current Facility-Administered Medications  Medication Dose Route Frequency Provider Last Rate Last Dose  . acetaminophen (TYLENOL) tablet 650 mg  650 mg Oral Q6H PRN Nira Conn A, NP      . alum & mag hydroxide-simeth (MAALOX/MYLANTA) 200-200-20 MG/5ML suspension 30 mL  30 mL Oral Q4H PRN Nira Conn A, NP      . citalopram (CELEXA) tablet 20 mg  20 mg Oral Daily Oneta Rack, NP   20 mg at 12/23/17 0758  . feeding supplement (ENSURE ENLIVE) (ENSURE ENLIVE) liquid 237 mL  237 mL Oral BID BM Cobos, Fernando A, MD      . gabapentin (NEURONTIN) capsule 300 mg  300 mg Oral TID Oneta Rack, NP   300 mg at 12/23/17 0757  . hydrOXYzine (ATARAX/VISTARIL) tablet 25 mg  25 mg Oral Q6H PRN Nira Conn A, NP   25 mg at 12/23/17 0759  . hydrOXYzine (ATARAX/VISTARIL) tablet 50 mg  50 mg Oral TID PRN Oneta Rack, NP   50 mg at 12/23/17 0802  . loperamide (IMODIUM) capsule 2-4 mg  2-4 mg Oral PRN Nira Conn A, NP      . LORazepam (ATIVAN) tablet 1 mg  1 mg Oral Q6H PRN Nira Conn A, NP   1 mg at 12/22/17 2146  . magnesium hydroxide (MILK OF MAGNESIA) suspension 30 mL  30 mL Oral Daily PRN Nira Conn A, NP      . nicotine polacrilex (NICORETTE) gum 2 mg  2 mg Oral PRN Cobos, Rockey Situ, MD   2 mg at 12/22/17 1154  . ondansetron (ZOFRAN-ODT) disintegrating tablet 4 mg  4 mg Oral Q6H PRN Nira Conn A, NP      . QUEtiapine (SEROQUEL) tablet 200 mg  200 mg Oral QHS Oneta Rack, NP   200 mg at 12/22/17 2146   And  . QUEtiapine (SEROQUEL) tablet 50 mg  50 mg Oral BID Oneta Rack, NP   50 mg at 12/23/17 0758  . traZODone (DESYREL) tablet 50 mg   50 mg Oral QHS PRN Jackelyn Poling, NP   50 mg at 12/22/17 2146   PTA Medications: Medications Prior to Admission  Medication Sig Dispense Refill Last Dose  . citalopram (CELEXA) 20 MG tablet Take 1 tablet (20 mg total) by mouth daily. For mood control 30 tablet 0 Past Week at Unknown time  . gabapentin (NEURONTIN) 300 MG capsule Take 1 capsule (300 mg total) by mouth 3 (three) times daily. 90 capsule 0 Past Week at Unknown time  . hydrOXYzine (ATARAX/VISTARIL) 50 MG tablet Take 1 tablet (50 mg total) by mouth 3 (three) times daily as needed for anxiety. 30 tablet 0 Past Week at Unknown time  . QUEtiapine (SEROQUEL) 50 MG tablet Take 3 tablets (150 mg total) by mouth at bedtime. For mood control 90 tablet 0 Past Week at Unknown time  . QUEtiapine (SEROQUEL) 50 MG tablet Take 1 tablet (50 mg total) by mouth 2 (two) times daily. 60 tablet 0 Past Week at Unknown time    Patient Stressors: Financial difficulties Substance abuse  Patient Strengths: Active sense of humor Average or above average intelligence  Treatment Modalities: Medication Management, Group therapy, Case management,  1 to 1 session with clinician, Psychoeducation, Recreational therapy.   Physician Treatment Plan for Primary Diagnosis: Severe recurrent major depression without psychotic features (HCC) Long Term Goal(s): Improvement in symptoms so as ready for discharge Improvement in symptoms so as ready for discharge   Short Term Goals: Ability to disclose and discuss suicidal ideas Ability to demonstrate self-control will improve Compliance with prescribed medications will improve Ability to identify triggers associated with substance abuse/mental health issues will improve Ability to identify changes in lifestyle to reduce recurrence of condition will improve Ability to verbalize feelings will improve Ability to identify and develop effective coping behaviors will improve Compliance with prescribed medications will  improve  Medication Management: Evaluate patient's response, side effects, and tolerance of medication regimen.  Therapeutic Interventions: 1 to 1 sessions, Unit Group sessions and Medication administration.  Evaluation of Outcomes: Progressing  Physician Treatment Plan for Secondary Diagnosis: Principal Problem:   Severe recurrent major depression without psychotic features (HCC)  Long Term Goal(s): Improvement in symptoms so as ready for discharge Improvement in symptoms so as ready for discharge   Short Term Goals: Ability to disclose and discuss suicidal ideas Ability to demonstrate self-control will improve Compliance with prescribed medications will improve Ability to identify triggers associated with substance abuse/mental health issues will improve Ability to identify changes in lifestyle to reduce recurrence of condition will improve Ability to verbalize feelings will improve Ability to identify and develop effective coping behaviors will improve Compliance with prescribed medications will improve     Medication Management: Evaluate patient's response, side effects, and tolerance of medication regimen.  Therapeutic Interventions: 1 to 1 sessions, Unit Group sessions and Medication administration.  Evaluation of Outcomes: Progressing   RN Treatment Plan for Primary Diagnosis: Severe recurrent major depression without psychotic features (HCC) Long Term Goal(s): Knowledge of disease and therapeutic regimen to maintain health will improve  Short Term Goals: Ability to remain free from injury will improve, Ability to disclose and discuss suicidal ideas and Ability to identify and develop effective coping behaviors will improve  Medication Management: RN will administer medications as ordered by provider, will assess and evaluate patient's response and provide education to patient for prescribed medication. RN will report any adverse and/or side effects to prescribing  provider.  Therapeutic Interventions: 1 on 1 counseling sessions, Psychoeducation, Medication administration, Evaluate responses to treatment, Monitor vital signs and CBGs as ordered, Perform/monitor CIWA, COWS, AIMS and Fall Risk screenings as ordered, Perform wound care treatments as ordered.  Evaluation of Outcomes: Progressing   LCSW Treatment Plan for Primary Diagnosis: Severe recurrent major depression without psychotic features (HCC) Long Term Goal(s): Safe transition to appropriate next level of care at discharge, Engage patient in therapeutic group addressing interpersonal concerns.  Short Term Goals: Engage patient in aftercare planning with referrals and resources, Facilitate patient progression through stages of change regarding substance use diagnoses and concerns and Identify triggers associated with mental health/substance abuse issues  Therapeutic Interventions: Assess for all discharge needs, 1 to 1 time with Social worker, Explore available resources and support systems, Assess for adequacy in community support network, Educate family and significant other(s) on suicide prevention, Complete Psychosocial Assessment, Interpersonal group therapy.  Evaluation of Outcomes: Progressing   Progress in Treatment: Attending groups: Yes. Participating in groups: Yes. Taking medication as prescribed: Yes. Toleration medication: Yes. Family/Significant other contact made: No, will contact:  family member/friend if pt consents to collateral contact.  Patient understands diagnosis: Yes. Discussing patient identified problems/goals with staff: Yes. Medical problems  stabilized or resolved: Yes. Denies suicidal/homicidal ideation: Yes. Issues/concerns per patient self-inventory: No. Other: n/a   New problem(s) identified: No, Describe:  n/a  New Short Term/Long Term Goal(s): detox, medication management for mood stabilization; elimination of SI thoughts; development of comprehensive  mental wellness/sobriety plan.   Patient Goal: "to make it to treatment successfully this time."   Discharge Plan or Barriers: Pt has [facility] tomorrow/Tuesday 3/5. [facility] for outpatient needs. [facility] pamphlet and Mobile Crisis information provided to pt.   Reason for Continuation of Hospitalization: Anxiety Depression Medication stabilization Withdrawal symptoms  Estimated Length of Stay: Tuesday AM 12/24/17  Attendees: Patient: Riley [name] 12/23/2017 8:45 AM  Physician: Dr. Jackquline Berlin MD; Dr. Altamese Ceredo MD 12/23/2017 8:45 AM  Nursing: Erskine Squibb RN; Armando Reichert RN 12/23/2017 8:45 AM  RN Care Manager: x 12/23/2017 8:45 AM  Social Worker: Chartered loss adjuster, LCSW 12/23/2017 8:45 AM  Recreational Therapist: x 12/23/2017 8:45 AM  Other: Hillery Jacks NP; Feliz Beam Money NP 12/23/2017 8:45 AM  Other:  12/23/2017 8:45 AM  Other: 12/23/2017 8:45 AM    Scribe for Treatment Team: Ledell Peoples Smart, LCSW 12/23/2017 8:45 AM

## 2017-12-23 NOTE — Progress Notes (Addendum)
Discharge- Patient verbalizes readiness for discharge: Denies SI/HI, is not psychotic or delusional. A- Discharge instruction read and discussed with Pt. All belongings returned to patient.  R- Pt verbalize understanding of discharge instructions.Signed for return of belongings.Taxi called. Breakfast tray given. Escorted to lobby and waited with Pt till arrival of taxi.

## 2017-12-23 NOTE — Progress Notes (Signed)
Patient attended much of the A.A.meeting but stated that the A.A.speakers were rude.

## 2017-12-23 NOTE — [facility] Group Notes (Signed)
LCSW Group Therapy Note   12/23/2017 1:15pm   Type of Therapy and Topic:  Group Therapy:  Overcoming Obstacles   Participation Level:  Active   Description of Group:    In this group patients will be encouraged to explore what they see as obstacles to their own wellness and recovery. They will be guided to discuss their thoughts, feelings, and behaviors related to these obstacles. The group will process together ways to cope with barriers, with attention given to specific choices patients can make. Each patient will be challenged to identify changes they are motivated to make in order to overcome their obstacles. This group will be process-oriented, with patients participating in exploration of their own experiences as well as giving and receiving support and challenge from other group members.   Therapeutic Goals: 1. Patient will identify personal and current obstacles as they relate to admission. 2. Patient will identify barriers that currently interfere with their wellness or overcoming obstacles.  3. Patient will identify feelings, thought process and behaviors related to these barriers. 4. Patient will identify two changes they are willing to make to overcome these obstacles:      Summary of Patient Progress  Riley [name] was attentive and engaged during today's processing group. He shared that his biggest obstacle remains maintaining sobriety and getting accepted into treatment. "[facility] I don't have any other options." Riley [name] continues to show progress in the group setting with improving insight.    Therapeutic Modalities:   Cognitive Behavioral Therapy Solution Focused Therapy Motivational Interviewing Relapse Prevention Therapy  Ledell PeoplesHeather N Smart, LCSW 12/23/2017 12:54 PM

## 2017-12-23 NOTE — Plan of Care (Signed)
Patient verbalizes understanding of information, education provided. 

## 2017-12-29 ENCOUNTER — Emergency Department (HOSPITAL_COMMUNITY)
Admission: EM | Admit: 2017-12-29 | Discharge: 2017-12-30 | Disposition: A | Discharge status: Home / Self Care | Payer: Self-pay | Attending: Emergency Medicine | Admitting: Emergency Medicine

## 2017-12-29 ENCOUNTER — Encounter (HOSPITAL_COMMUNITY): Payer: Self-pay

## 2017-12-29 ENCOUNTER — Other Ambulatory Visit: Payer: Self-pay

## 2017-12-29 DIAGNOSIS — Y905 Blood alcohol level of 100-119 mg/100 ml: Secondary | ICD-10-CM | POA: Insufficient documentation

## 2017-12-29 DIAGNOSIS — F1014 Alcohol abuse with alcohol-induced mood disorder: Secondary | ICD-10-CM | POA: Insufficient documentation

## 2017-12-29 DIAGNOSIS — Z915 Personal history of self-harm: Secondary | ICD-10-CM | POA: Insufficient documentation

## 2017-12-29 DIAGNOSIS — Z79899 Other long term (current) drug therapy: Secondary | ICD-10-CM | POA: Insufficient documentation

## 2017-12-29 DIAGNOSIS — F17210 Nicotine dependence, cigarettes, uncomplicated: Secondary | ICD-10-CM | POA: Insufficient documentation

## 2017-12-29 DIAGNOSIS — F1010 Alcohol abuse, uncomplicated: Secondary | ICD-10-CM

## 2017-12-29 DIAGNOSIS — R45851 Suicidal ideations: Secondary | ICD-10-CM | POA: Insufficient documentation

## 2017-12-29 DIAGNOSIS — F329 Major depressive disorder, single episode, unspecified: Secondary | ICD-10-CM | POA: Insufficient documentation

## 2017-12-29 LAB — COMPREHENSIVE METABOLIC PANEL
ALK PHOS: 98 U/L (ref 38–126)
ALT: 45 U/L (ref 17–63)
AST: 27 U/L (ref 15–41)
Albumin: 4 g/dL (ref 3.5–5.0)
Anion gap: 12 (ref 5–15)
BUN: 14 mg/dL (ref 6–20)
CALCIUM: 9.4 mg/dL (ref 8.9–10.3)
CHLORIDE: 103 mmol/L (ref 101–111)
CO2: 25 mmol/L (ref 22–32)
Creatinine, Ser: 0.91 mg/dL (ref 0.61–1.24)
Glucose, Bld: 87 mg/dL (ref 65–99)
Potassium: 4.1 mmol/L (ref 3.5–5.1)
SODIUM: 140 mmol/L (ref 135–145)
Total Bilirubin: 0.6 mg/dL (ref 0.3–1.2)
Total Protein: 7.7 g/dL (ref 6.5–8.1)

## 2017-12-29 LAB — CBC
HCT: 41.9 % (ref 39.0–52.0)
HEMOGLOBIN: 14.2 g/dL (ref 13.0–17.0)
MCH: 33.5 pg (ref 26.0–34.0)
MCHC: 33.9 g/dL (ref 30.0–36.0)
MCV: 98.8 fL (ref 78.0–100.0)
Platelets: 353 10*3/uL (ref 150–400)
RBC: 4.24 MIL/uL (ref 4.22–5.81)
RDW: 14.2 % (ref 11.5–15.5)
WBC: 8.1 10*3/uL (ref 4.0–10.5)

## 2017-12-29 LAB — RAPID URINE DRUG SCREEN, HOSP PERFORMED
AMPHETAMINES: NOT DETECTED
BARBITURATES: NOT DETECTED
Benzodiazepines: NOT DETECTED
Cocaine: NOT DETECTED
OPIATES: NOT DETECTED
TETRAHYDROCANNABINOL: NOT DETECTED

## 2017-12-29 LAB — ETHANOL: Alcohol, Ethyl (B): 102 mg/dL — ABNORMAL HIGH (ref <10)

## 2017-12-29 LAB — SALICYLATE LEVEL

## 2017-12-29 LAB — ACETAMINOPHEN LEVEL: Acetaminophen (Tylenol), Serum: <10 ug/mL — ABNORMAL LOW (ref 10–30)

## 2017-12-29 MED ORDER — QUETIAPINE FUMARATE 100 MG PO TABS
200.0000 mg | ORAL_TABLET | Freq: Every day | ORAL | Status: DC
  Administered 2017-12-29: 200 mg via ORAL
  Filled 2017-12-29: qty 2

## 2017-12-29 MED ORDER — LORAZEPAM 1 MG PO TABS: 0.0000 mg | ORAL_TABLET | Freq: Four times a day (QID) | ORAL | Status: DC

## 2017-12-29 MED ORDER — LORAZEPAM 2 MG/ML IJ SOLN: 0.0000 mg | Freq: Four times a day (QID) | INTRAMUSCULAR | Status: DC

## 2017-12-29 MED ORDER — QUETIAPINE FUMARATE 50 MG PO TABS
50.0000 mg | ORAL_TABLET | Freq: Two times a day (BID) | ORAL | Status: DC
  Administered 2017-12-30: 50 mg via ORAL
  Filled 2017-12-29: qty 1

## 2017-12-29 MED ORDER — GABAPENTIN 300 MG PO CAPS
300.0000 mg | ORAL_CAPSULE | Freq: Three times a day (TID) | ORAL | Status: DC
  Administered 2017-12-29 – 2017-12-30 (×2): 300 mg via ORAL
  Filled 2017-12-29 (×2): qty 1

## 2017-12-29 MED ORDER — THIAMINE HCL 100 MG/ML IJ SOLN: 100.0000 mg | Freq: Every day | INTRAMUSCULAR | Status: DC

## 2017-12-29 MED ORDER — HYDROXYZINE HCL 25 MG PO TABS
50.0000 mg | ORAL_TABLET | Freq: Three times a day (TID) | ORAL | Status: DC | PRN
  Administered 2017-12-29: 50 mg via ORAL
  Filled 2017-12-29: qty 2

## 2017-12-29 MED ORDER — CITALOPRAM HYDROBROMIDE 10 MG PO TABS
20.0000 mg | ORAL_TABLET | Freq: Every day | ORAL | Status: DC
  Administered 2017-12-29 – 2017-12-30 (×2): 20 mg via ORAL
  Filled 2017-12-29 (×2): qty 2

## 2017-12-29 MED ORDER — LORAZEPAM 2 MG/ML IJ SOLN: 0.0000 mg | Freq: Two times a day (BID) | INTRAMUSCULAR | Status: DC

## 2017-12-29 MED ORDER — NICOTINE 21 MG/24HR TD PT24: 21.0000 mg | MEDICATED_PATCH | Freq: Every day | TRANSDERMAL | Status: DC

## 2017-12-29 MED ORDER — TRAZODONE HCL 50 MG PO TABS
50.0000 mg | ORAL_TABLET | Freq: Every evening | ORAL | Status: DC | PRN
  Administered 2017-12-29: 50 mg via ORAL
  Filled 2017-12-29: qty 1

## 2017-12-29 MED ORDER — VITAMIN B-1 100 MG PO TABS: 100.0000 mg | ORAL_TABLET | Freq: Every day | ORAL | Status: DC

## 2017-12-29 MED ORDER — LORAZEPAM 1 MG PO TABS: 0.0000 mg | ORAL_TABLET | Freq: Two times a day (BID) | ORAL | Status: DC

## 2017-12-29 NOTE — ED Notes (Signed)
Report given to Riley HongJudy, Charity fundraiserN. Pt wanded by security and [facility]. No distress noted.
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
## 2017-12-29 NOTE — ED Notes (Signed)
[facility] Expected date:  Expected time:  Means of arrival:  Comments: Wierzba

## 2017-12-29 NOTE — ED Notes (Signed)
SBAR Report received from previous nurse. Pt received calm and visible on unit. Pt denies current HI, A/V H, depression, or pain at this time, and appears otherwise stable and free of distress. Pt endorses SI and anxiety but has contracted for safety Pt reminded of camera surveillance, q 15 min rounds, and rules of the milieu. Will continue to assess.

## 2017-12-29 NOTE — ED Triage Notes (Signed)
He c/o "having suicidal thoughts" without plan. He also states he has hx of substance abuse "but I'm just here for mental health problems today". He further states "[facility] and they told me to come here for 'med clearance'". He denies fever/cough/nor any other sign of illness.

## 2017-12-29 NOTE — ED Notes (Signed)
CIWA deferred.  Patient resting quietly with eyes closed,respirations even and unlabored.

## 2017-12-29 NOTE — ED Notes (Signed)
Please refer to TTS assessment for current chief complaint.  Patient is currently resting quietly with eyes closed. Respirations even and unlabored.

## 2017-12-29 NOTE — ED Provider Notes (Signed)
[facility]-EMERGENCY DEPT Provider Note   CSN: [id number] Arrival date & time: 12/29/17  1450     History   Chief Complaint Chief Complaint  Patient presents with  . Suicidal    HPI Riley [name] is a 27 y.o. male with a past medical history of alcohol abuse, depression, prior suicide attempts x2, who presents to ED for evaluation of ongoing suicidal ideations that have worsened in the past several months.  No specific plan at this time.  Several months ago he admits to taking 10 tablets of his friend's blood pressure medication hoping to "not wake up."  He also reports a 1 month ago taking his friend's insulin in another suicide attempt.  He prolonged time which she describes as this is the first time that he is seeking help "for myself."  He released himself from [facility] yesterday for his alcohol abuse because he wants help for his mental health first.  His last drink was one beer this morning.  Denies any HI, AVH, chest pain, seizures, tremors or other symptoms.  Reports 1-1/2 packs daily tobacco use but denies any other drug use.  HPI  Past Medical History:  Diagnosis Date  . Alcoholism [facility]     Patient Active Problem List   Diagnosis Date Noted  . Severe recurrent major depression without psychotic features (HCC) 12/21/2017  . MDD (major depressive disorder), severe (HCC) 12/13/2017  . Alcoholism (HCC) 12/12/2017  . Hypokalemia 12/12/2017  . Suicidal ideations 12/12/2017  . Hypoglycemia due to insulin 12/12/2017  . Alcohol abuse with intoxication (HCC) 12/12/2017  . Hypoglycemia 12/12/2017  . Insulin overdose     History reviewed. No pertinent surgical history.     Home Medications    Prior to Admission medications   Medication Sig Start Date End Date Taking? Authorizing Provider  citalopram (CELEXA) 20 MG tablet Take 1 tablet (20 mg total) by mouth daily. for mood control 12/24/17   Money, Gerlene Burdock, FNP  gabapentin (NEURONTIN) 300 MG  capsule Take 1 capsule (300 mg total) by mouth 3 (three) times daily. 12/23/17   Money, Gerlene Burdock, FNP  hydrOXYzine (ATARAX/VISTARIL) 50 MG tablet Take 1 tablet (50 mg total) by mouth 3 (three) times daily as needed for anxiety. 12/23/17   Money, Gerlene Burdock, FNP  QUEtiapine (SEROQUEL) 200 MG tablet Take 1 tablet (200 mg total) by mouth at bedtime. For mood control 12/23/17   Money, Gerlene Burdock, FNP  QUEtiapine (SEROQUEL) 50 MG tablet Take 1 tablet (50 mg total) by mouth 2 (two) times daily. For mood control 12/23/17   Money, Gerlene Burdock, FNP  traZODone (DESYREL) 50 MG tablet Take 1 tablet (50 mg total) by mouth at bedtime as needed for sleep. 12/23/17   Money, Gerlene Burdock, FNP    Family History Family History  Family history unknown: Yes    Social History Social History   Tobacco Use  . Smoking status: Current Every Day Smoker    Packs/day: 2.00    Types: Cigarettes  . Smokeless tobacco: Never Used  Substance Use Topics  . Alcohol use: Yes    Comment: Pt stated "I drink  1/2 gal of 100 proof every day"  . Drug use: No     Allergies   Patient has no known allergies.   Review of Systems Review of Systems  Constitutional: Negative for appetite change, chills and fever.  HENT: Negative for ear pain, rhinorrhea, sneezing and sore throat.   Eyes: Negative for photophobia and  visual disturbance.  Respiratory: Negative for cough, chest tightness, shortness of breath and wheezing.   Cardiovascular: Negative for chest pain and palpitations.  Gastrointestinal: Negative for abdominal pain, blood in stool, constipation, diarrhea, nausea and vomiting.  Genitourinary: Negative for dysuria, hematuria and urgency.  Musculoskeletal: Negative for myalgias.  Skin: Negative for rash.  Neurological: Negative for dizziness, weakness and light-headedness.  Psychiatric/Behavioral: Positive for agitation and suicidal ideas.     Physical Exam Updated Vital Signs BP (!) 125/58 (BP Location: Left Arm)   Pulse (!) 115    Temp 98.2 F (36.8 C) (Oral)   Resp 18   SpO2 96%   Physical Exam  Constitutional: He appears well-developed and well-nourished. No distress.  HENT:  Head: Normocephalic and atraumatic.  Nose: Nose normal.  Eyes: Conjunctivae and EOM are normal. Right eye exhibits no discharge. Left eye exhibits no discharge. No scleral icterus.  Neck: Normal range of motion. Neck supple.  Cardiovascular: Regular rhythm, normal heart sounds and intact distal pulses. Tachycardia present. Exam reveals no gallop and no friction rub.  No murmur heard. Pulmonary/Chest: Effort normal and breath sounds normal. No respiratory distress.  Abdominal: Soft. Bowel sounds are normal. He exhibits no distension. There is no tenderness. There is no guarding.  Musculoskeletal: Normal range of motion. He exhibits no edema.  No tremors noted.  Neurological: He is alert. He exhibits normal muscle tone. Coordination normal.  Skin: Skin is warm and dry. No rash noted.  Psychiatric: He has a normal mood and affect.  Nursing note and vitals reviewed.    ED Treatments / Results  Labs (all labs ordered are listed, but only abnormal results are displayed) Labs Reviewed  ETHANOL - Abnormal; Notable for the following components:      Result Value   Alcohol, Ethyl (B) 102 (*)    All other components within normal limits  ACETAMINOPHEN LEVEL - Abnormal; Notable for the following components:   Acetaminophen (Tylenol), Serum <10 (*)    All other components within normal limits  COMPREHENSIVE METABOLIC PANEL  SALICYLATE LEVEL  CBC  RAPID URINE DRUG SCREEN, HOSP PERFORMED    EKG  EKG Interpretation None       Radiology No results found.  Procedures Procedures (including critical care time)  Medications Ordered in ED Medications  LORazepam (ATIVAN) injection 0-4 mg (0 mg Intravenous Not Given 12/29/17 1818)    Or  LORazepam (ATIVAN) tablet 0-4 mg ( Oral See Alternative 12/29/17 1818)  LORazepam (ATIVAN)  injection 0-4 mg (not administered)    Or  LORazepam (ATIVAN) tablet 0-4 mg (not administered)  thiamine (VITAMIN B-1) tablet 100 mg (100 mg Oral Not Given 12/29/17 1820)    Or  thiamine (B-1) injection 100 mg ( Intravenous See Alternative 12/29/17 1820)  nicotine (NICODERM CQ - dosed in mg/24 hours) patch 21 mg (21 mg Transdermal Not Given 12/29/17 1819)     Initial Impression / Assessment and Plan / ED Course  I have reviewed the triage vital signs and the nursing notes.  Pertinent labs & imaging results that were available during my care of the patient were reviewed by me and considered in my medical decision making (see chart for details).     Presents to ED for evaluation of worsening suicidal ideations for the past several months.  He does have a history of alcohol abuse.  He endorses suicidal ideations with no specific plan today.  Denies any HI, AVH.  Reports to daily alcohol use.  He released himself from  [facility] yesterday because he wants to get help for his mental health.  Medical screening lab work unremarkable with the exception of EtOH level at 102.  No signs of withdrawal noted at this time.  CIWA protocol initiated.  Patient is medically cleared for TTS evaluation.  Portions of this note were generated with Scientist, clinical (histocompatibility and immunogenetics). Dictation errors may occur despite best attempts at proofreading.   Final Clinical Impressions(s) / ED Diagnoses   Final diagnoses:  None    ED Discharge Orders    None       Dietrich Pates, PA-C 12/29/17 1826    Mancel Bale, MD 01/02/18 [phone]

## 2017-12-29 NOTE — ED Notes (Signed)
Patient wanded by security and changed into burgandy scrubs.

## 2017-12-30 DIAGNOSIS — Z590 Homelessness: Secondary | ICD-10-CM

## 2017-12-30 DIAGNOSIS — G4700 Insomnia, unspecified: Secondary | ICD-10-CM

## 2017-12-30 DIAGNOSIS — F17210 Nicotine dependence, cigarettes, uncomplicated: Secondary | ICD-10-CM

## 2017-12-30 DIAGNOSIS — F1014 Alcohol abuse with alcohol-induced mood disorder: Secondary | ICD-10-CM | POA: Diagnosis present

## 2017-12-30 NOTE — [facility] Suicide Risk Assessment (Signed)
Suicide Risk Assessment  Discharge Assessment   [facility]   Principal Problem: Alcohol abuse with alcohol-induced mood disorder [facility](HCC) Discharge Diagnoses:  Patient Active Problem List   Diagnosis Date Noted  . Alcohol abuse with alcohol-induced mood disorder (HCC) [F10.14] 12/30/2017    Priority: High  . Severe recurrent major depression without psychotic features (HCC) [F33.2] 12/21/2017  . MDD (major depressive disorder), severe (HCC) [F32.2] 12/13/2017  . Alcoholism (HCC) [F10.20] 12/12/2017  . Hypokalemia [E87.6] 12/12/2017  . Suicidal ideations [R45.851] 12/12/2017  . Hypoglycemia due to insulin [E16.0, T38.3X5A] 12/12/2017  . Alcohol abuse with intoxication (HCC) [F10.129] 12/12/2017  . Hypoglycemia [E16.2] 12/12/2017  . Insulin overdose [T38.3X1A]     Total Time spent with patient: 45 minutes  Musculoskeletal: Strength & Muscle Tone: within normal limits Gait & Station: normal Patient leans: N/A  Psychiatric Specialty Exam: Physical Exam  Constitutional: He is oriented to person, place, and time. He appears well-developed and well-nourished.  HENT:  Head: Normocephalic.  Neck: Normal range of motion.  Respiratory: Effort normal.  Musculoskeletal: Normal range of motion.  Neurological: He is alert and oriented to person, place, and time.  Psychiatric: He has a normal mood and affect. His speech is normal and behavior is normal. Judgment and thought content normal. Cognition and memory are normal.    Review of Systems  Psychiatric/Behavioral: Positive for substance abuse.  All other systems reviewed and are negative.   Blood pressure 112/76, pulse 73, temperature (!) 97.5 F (36.4 C), resp. rate 18, SpO2 98 %.There is no height or weight on file to calculate BMI.  General Appearance: Casual  Eye Contact:  Good  Speech:  Normal Rate  Volume:  Normal  Mood:  Euthymic  Affect:  Congruent  Thought Process:  Coherent and Descriptions of  Associations: Intact  Orientation:  Full (Time, Place, and Person)  Thought Content:  WDL and Logical  Suicidal Thoughts:  No  Homicidal Thoughts:  No  Memory:  Immediate;   Good Recent;   Good Remote;   Good  Judgement:  Fair  Insight:  Fair  Psychomotor Activity:  Normal  Concentration:  Concentration: Good and Attention Span: Good  Recall:  Good  Fund of Knowledge:  Good  Language:  Good  Akathisia:  No  Handed:  Right  AIMS (if indicated):     Assets:  Leisure Time Physical Health Resilience  ADL's:  Intact  Cognition:  WNL  Sleep:      Mental Status Per Nursing Assessment::   On Admission:   alcohol abuse with depression  Demographic Factors:  Caucasian  Loss Factors: NA  Historical Factors: NA  Risk Reduction Factors:   Sense of responsibility to family and Positive social support  Continued Clinical Symptoms:  None   Cognitive Features That Contribute To Risk:  None    Suicide Risk:  Minimal: No identifiable suicidal ideation.  Patients presenting with no risk factors but with morbid ruminations; may be classified as minimal risk based on the severity of the depressive symptoms    Plan Of Care/Follow-up recommendations:  Activity:  as tolerated Diet:  heart healthy diet  Riley Sequeira, NP 12/30/2017, 10:02 AM

## 2017-12-30 NOTE — Consult Note (Addendum)
[place] [facility] Consult   Reason for Consult:  Alcohol abuse with depression Referring Physician:  EDP Patient Identification: Riley [name] MRN:  [id number] Principal Diagnosis: Alcohol abuse with alcohol-induced mood disorder [facility]) Diagnosis:   Patient Active Problem List   Diagnosis Date Noted  . Alcohol abuse with alcohol-induced mood disorder ([place]) [F10.14] 12/30/2017    Priority: High  . Severe recurrent major depression without psychotic features ([place]) [F33.2] 12/21/2017  . MDD (major depressive disorder), severe ([place]) [F32.2] 12/13/2017  . Alcoholism ([place]) [F10.20] 12/12/2017  . Hypokalemia [E87.6] 12/12/2017  . Suicidal ideations [R45.851] 12/12/2017  . Hypoglycemia due to insulin [E16.0, T38.3X5A] 12/12/2017  . Alcohol abuse with intoxication ([place]) [F10.129] 12/12/2017  . Hypoglycemia [E16.2] 12/12/2017  . Insulin overdose [T38.3X1A]     Total Time spent with patient: 45 minutes  Subjective:   Riley [name] is a 27 y.o. male patient does not warrant admission.  HPI:  27 yo male who presented to the ED after using alcohol and feeling he needed more mental help.  His main focus is being homeless, however.  On assessment, no suicidal/homicidal ideations, hallucinations, or withdrawal symptoms.  He was just at [facility] and left there for [facility], just released.  Encouraged him to use his outpatient services, stable for discharge.  Past Psychiatric History: alcohol dependence, depression  Risk to Self: None Risk to Others: None Prior Inpatient Therapy: Prior Inpatient Therapy: Yes Prior Therapy Dates: (discharged from [facility] on 12/23/17 - went to [facility]) Prior Therapy Facilty/Provider(s): [facility]) Reason for Treatment: (depression and etoh) Prior Outpatient Therapy: Prior Outpatient Therapy: No Prior Therapy Dates: (none reported) Prior Therapy Facilty/Provider(s): (none) Reason for Treatment: (none) Does patient have an ACCT team?: No Does patient have  Intensive In-House Services?  : No Does patient have [facility] services? : No Does patient have P4CC services?: No  Past Medical History:  Past Medical History:  Diagnosis Date  . Alcoholism ([name])    History reviewed. No pertinent surgical history. Family History:  Family History  Family history unknown: Yes   Family Psychiatric  History: none Social History:  Social History   Substance and Sexual Activity  Alcohol Use Yes   Comment: Pt stated "I drink  1/2 gal of 100 proof every day"     Social History   Substance and Sexual Activity  Drug Use No    Social History   Socioeconomic History  . Marital status: Single    Spouse name: None  . Number of children: None  . Years of education: None  . Highest education level: None  Social Needs  . Financial resource strain: None  . Food insecurity - worry: None  . Food insecurity - inability: None  . Transportation needs - medical: None  . Transportation needs - non-medical: None  Occupational History  . None  Tobacco Use  . Smoking status: Current Every Day Smoker    Packs/day: 2.00    Types: Cigarettes  . Smokeless tobacco: Never Used  Substance and Sexual Activity  . Alcohol use: Yes    Comment: Pt stated "I drink  1/2 gal of 100 proof every day"  . Drug use: No  . Sexual activity: Not Currently  Other Topics Concern  . None  Social History Narrative  . None   Additional Social History: N/A    Allergies:  No Known Allergies  Labs:  Results for orders placed or performed during the hospital encounter of 12/29/17 (from the past 48 hour(s))  Comprehensive metabolic  panel     Status: None   Collection Time: 12/29/17  5:00 PM  Result Value Ref Range   Sodium 140 135 - 145 mmol/L   Potassium 4.1 3.5 - 5.1 mmol/L   Chloride 103 101 - 111 mmol/L   CO2 25 22 - 32 mmol/L   Glucose, Bld 87 65 - 99 mg/dL   BUN 14 6 - 20 mg/dL   Creatinine, Ser 0.91 0.61 - 1.24 mg/dL   Calcium 9.4 8.9 - 10.3 mg/dL   Total Protein  7.7 6.5 - 8.1 g/dL   Albumin 4.0 3.5 - 5.0 g/dL   AST 27 15 - 41 U/L   ALT 45 17 - 63 U/L   Alkaline Phosphatase 98 38 - 126 U/L   Total Bilirubin 0.6 0.3 - 1.2 mg/dL   GFR calc non Af Amer >60 >60 mL/min   GFR calc Af Amer >60 >60 mL/min    Comment: (NOTE) The eGFR has been calculated using the CKD EPI equation. This calculation has not been validated in all clinical situations. eGFR's persistently <60 mL/min signify possible Chronic Kidney Disease.    Anion gap 12 5 - 15    Comment: Performed at [facility], [place] [address] [place], [place] [address]  Ethanol     Status: Abnormal   Collection Time: 12/29/17  5:00 PM  Result Value Ref Range   Alcohol, Ethyl (B) 102 (H) <10 mg/dL    Comment:        LOWEST DETECTABLE LIMIT FOR SERUM ALCOHOL IS 10 mg/dL FOR MEDICAL PURPOSES ONLY Performed at [place] [address] [place], [place] [address]   Salicylate level     Status: None   Collection Time: 12/29/17  5:00 PM  Result Value Ref Range   Salicylate Lvl <2.1 2.8 - 30.0 mg/dL    Comment: Performed at [facility], [place] [address] [place], [place] [address]  Acetaminophen level     Status: Abnormal   Collection Time: 12/29/17  5:00 PM  Result Value Ref Range   Acetaminophen (Tylenol), Serum <10 (L) 10 - 30 ug/mL    Comment:        THERAPEUTIC CONCENTRATIONS VARY SIGNIFICANTLY. A RANGE OF 10-30 ug/mL MAY BE AN EFFECTIVE CONCENTRATION FOR MANY PATIENTS. HOWEVER, SOME ARE BEST TREATED AT CONCENTRATIONS OUTSIDE THIS RANGE. ACETAMINOPHEN CONCENTRATIONS >150 ug/mL AT 4 HOURS AFTER INGESTION AND >50 ug/mL AT 12 HOURS AFTER INGESTION ARE OFTEN ASSOCIATED WITH TOXIC REACTIONS. Performed at [facility], [place] [address], [place], [place] [address]   cbc     Status: None   Collection Time: 12/29/17  5:00 PM  Result Value Ref Range   WBC 8.1 4.0 - 10.5 K/uL   RBC 4.24 4.22 - 5.81 MIL/uL    Hemoglobin 14.2 13.0 - 17.0 g/dL   HCT 41.9 39.0 - 52.0 %   MCV 98.8 78.0 - 100.0 fL   MCH 33.5 26.0 - 34.0 pg   MCHC 33.9 30.0 - 36.0 g/dL   RDW 14.2 11.5 - 15.5 %   Platelets 353 150 - 400 K/uL    Comment: Performed at [facility], [place] [address] [place], [place] [address]  Rapid urine drug screen (hospital performed)     Status: None   Collection Time: 12/29/17  5:08 PM  Result Value Ref Range   Opiates NONE DETECTED NONE DETECTED   Cocaine NONE DETECTED NONE DETECTED   Benzodiazepines NONE DETECTED NONE DETECTED  Amphetamines NONE DETECTED NONE DETECTED   Tetrahydrocannabinol NONE DETECTED NONE DETECTED   Barbiturates NONE DETECTED NONE DETECTED    Comment: (NOTE) DRUG SCREEN FOR MEDICAL PURPOSES ONLY.  IF CONFIRMATION IS NEEDED FOR ANY PURPOSE, NOTIFY LAB WITHIN 5 DAYS. LOWEST DETECTABLE LIMITS FOR URINE DRUG SCREEN Drug Class                     Cutoff (ng/mL) Amphetamine and metabolites    1000 Barbiturate and metabolites    200 Benzodiazepine                 409 Tricyclics and metabolites     300 Opiates and metabolites        300 Cocaine and metabolites        300 THC                            50 Performed at [facility], [place] [address], [place], [place] [address]     Current Facility-Administered Medications  Medication Dose Route Frequency Provider Last Rate Last Dose  . citalopram (CELEXA) tablet 20 mg  20 mg Oral Daily Khatri, Hina, PA-C   20 mg at 12/29/17 2108  . gabapentin (NEURONTIN) capsule 300 mg  300 mg Oral TID Delia Heady, PA-C   300 mg at 12/29/17 2109  . hydrOXYzine (ATARAX/VISTARIL) tablet 50 mg  50 mg Oral TID PRN Shelly Coss, Hina, PA-C   50 mg at 12/29/17 2109  . nicotine (NICODERM CQ - dosed in mg/24 hours) patch 21 mg  21 mg Transdermal Daily Khatri, Hina, PA-C      . QUEtiapine (SEROQUEL) tablet 200 mg  200 mg Oral QHS Lindon Romp A, NP   200 mg at 12/29/17 2109  . QUEtiapine (SEROQUEL) tablet 50 mg  50  mg Oral BID Lindon Romp A, NP      . traZODone (DESYREL) tablet 50 mg  50 mg Oral QHS PRN Rozetta Nunnery, NP   50 mg at 12/29/17 2109   Current Outpatient Medications  Medication Sig Dispense Refill  . citalopram (CELEXA) 20 MG tablet Take 1 tablet (20 mg total) by mouth daily. for mood control 30 tablet 0  . gabapentin (NEURONTIN) 300 MG capsule Take 1 capsule (300 mg total) by mouth 3 (three) times daily. 90 capsule 0  . hydrOXYzine (ATARAX/VISTARIL) 50 MG tablet Take 1 tablet (50 mg total) by mouth 3 (three) times daily as needed for anxiety. 30 tablet 0  . QUEtiapine (SEROQUEL) 200 MG tablet Take 1 tablet (200 mg total) by mouth at bedtime. For mood control 30 tablet 0  . QUEtiapine (SEROQUEL) 50 MG tablet Take 1 tablet (50 mg total) by mouth 2 (two) times daily. For mood control 60 tablet 0  . traZODone (DESYREL) 50 MG tablet Take 1 tablet (50 mg total) by mouth at bedtime as needed for sleep. 30 tablet 0    Musculoskeletal: Strength & Muscle Tone: within normal limits Gait & Station: normal Patient leans: N/A  Psychiatric Specialty Exam: Physical Exam  Nursing note and vitals reviewed. Constitutional: He is oriented to person, place, and time. He appears well-developed and well-nourished.  HENT:  Head: Normocephalic and atraumatic.  Neck: Normal range of motion.  Respiratory: Effort normal.  Musculoskeletal: Normal range of motion.  Neurological: He is alert and oriented to person, place, and time.  Psychiatric: He has a normal mood and affect. His speech is normal and  behavior is normal. Judgment and thought content normal. Cognition and memory are normal.    Review of Systems  Psychiatric/Behavioral: Positive for substance abuse.  All other systems reviewed and are negative.   Blood pressure 112/76, pulse 73, temperature (!) 97.5 F (36.4 C), resp. rate 18, SpO2 98 %.There is no height or weight on file to calculate BMI.  General Appearance: Casual  Eye Contact:  Good   Speech:  Normal Rate  Volume:  Normal  Mood:  Euthymic  Affect:  Congruent  Thought Process:  Coherent and Descriptions of Associations: Intact  Orientation:  Full (Time, Place, and Person)  Thought Content:  WDL and Logical  Suicidal Thoughts:  No  Homicidal Thoughts:  No  Memory:  Immediate;   Good Recent;   Good Remote;   Good  Judgement:  Fair  Insight:  Fair  Psychomotor Activity:  Normal  Concentration:  Concentration: Good and Attention Span: Good  Recall:  Good  Fund of Knowledge:  Good  Language:  Good  Akathisia:  No  Handed:  Right  AIMS (if indicated):   N/A  Assets:  Leisure Time Physical Health Resilience  ADL's:  Intact  Cognition:  WNL  Sleep:   N/A     Treatment Plan Summary: Daily contact with patient to assess and evaluate symptoms and progress in treatment, Medication management and Plan alcohol abuse with alcohol induced mood disorder:  -Crisis stabilization -Medication management:  Continued his Gabapentin 300 mg TID for alcohol dependence, Vistaril 50 mg TID PRN anxiety, Celexa 20 mg daily for depression, Seroquel 50 mg BID and 200 mg at bedtime for mood stabilization, and Trazodone 50 mg at bedtime PRN sleep -Individual and substance abuse counseling  Disposition: No evidence of imminent risk to self or others at present.    Waylan Boga, NP 12/30/2017 9:56 AM   Patient seen face-to-face for psychiatric evaluation, chart reviewed and case discussed with the physician extender and developed treatment plan. Reviewed the information documented and agree with the treatment plan.  Buford Dresser, DO 12/30/17 2:59 PM

## 2017-12-30 NOTE — BH Assessment (Signed)
[facility]  Per Juanetta BeetsJacqueline Norman, DO, this pt does not require psychiatric hospitalization at this time.  [facility]  This has been included in pt's discharge instructions.  Pt's nurse, Diane, has been notified.  Doylene Canninghomas Rosemaria Inabinet, MA Triage Specialist [phone]

## 2017-12-30 NOTE — Discharge Instructions (Signed)
To help you maintain a sober lifestyle, a substance abuse treatment program may be beneficial to you.  Contact [facility] at your earliest opportunity to ask about enrolling in their program: ° °     [facility] ([facility]) °     [address] °     [place], [place] [address] °     [phone] °     New patients are seen at the walk-in clinic every Tuesday from 9:00 am - 12:00 pm °

## 2018-06-11 ENCOUNTER — Inpatient Hospital Stay (HOSPITAL_COMMUNITY)
Admission: EM | Admit: 2018-06-11 | Discharge: 2018-06-16 | DRG: 917 | Disposition: A | Discharge status: Home / Self Care | Payer: Self-pay | Attending: Internal Medicine | Admitting: Internal Medicine

## 2018-06-11 ENCOUNTER — Other Ambulatory Visit: Payer: Self-pay

## 2018-06-11 ENCOUNTER — Emergency Department (HOSPITAL_COMMUNITY): Payer: Self-pay

## 2018-06-11 ENCOUNTER — Encounter (HOSPITAL_COMMUNITY): Payer: Self-pay | Admitting: *Deleted

## 2018-06-11 DIAGNOSIS — F329 Major depressive disorder, single episode, unspecified: Secondary | ICD-10-CM | POA: Diagnosis present

## 2018-06-11 DIAGNOSIS — F10239 Alcohol dependence with withdrawal, unspecified: Secondary | ICD-10-CM | POA: Diagnosis present

## 2018-06-11 DIAGNOSIS — F1024 Alcohol dependence with alcohol-induced mood disorder: Secondary | ICD-10-CM | POA: Diagnosis present

## 2018-06-11 DIAGNOSIS — J9601 Acute respiratory failure with hypoxia: Secondary | ICD-10-CM | POA: Diagnosis present

## 2018-06-11 DIAGNOSIS — T50901A Poisoning by unspecified drugs, medicaments and biological substances, accidental (unintentional), initial encounter: Secondary | ICD-10-CM

## 2018-06-11 DIAGNOSIS — Y908 Blood alcohol level of 240 mg/100 ml or more: Secondary | ICD-10-CM | POA: Diagnosis present

## 2018-06-11 DIAGNOSIS — F1014 Alcohol abuse with alcohol-induced mood disorder: Secondary | ICD-10-CM | POA: Diagnosis present

## 2018-06-11 DIAGNOSIS — F10129 Alcohol abuse with intoxication, unspecified: Secondary | ICD-10-CM | POA: Diagnosis present

## 2018-06-11 DIAGNOSIS — G4700 Insomnia, unspecified: Secondary | ICD-10-CM | POA: Diagnosis present

## 2018-06-11 DIAGNOSIS — F1010 Alcohol abuse, uncomplicated: Secondary | ICD-10-CM

## 2018-06-11 DIAGNOSIS — F17210 Nicotine dependence, cigarettes, uncomplicated: Secondary | ICD-10-CM | POA: Diagnosis present

## 2018-06-11 DIAGNOSIS — T403X1A Poisoning by methadone, accidental (unintentional), initial encounter: Principal | ICD-10-CM | POA: Diagnosis present

## 2018-06-11 DIAGNOSIS — R45851 Suicidal ideations: Secondary | ICD-10-CM

## 2018-06-11 DIAGNOSIS — Z79899 Other long term (current) drug therapy: Secondary | ICD-10-CM

## 2018-06-11 DIAGNOSIS — R339 Retention of urine, unspecified: Secondary | ICD-10-CM | POA: Diagnosis present

## 2018-06-11 DIAGNOSIS — F10229 Alcohol dependence with intoxication, unspecified: Secondary | ICD-10-CM | POA: Diagnosis present

## 2018-06-11 DIAGNOSIS — Z915 Personal history of self-harm: Secondary | ICD-10-CM

## 2018-06-11 LAB — COMPREHENSIVE METABOLIC PANEL
ALT: 38 U/L (ref 0–44)
AST: 45 U/L — ABNORMAL HIGH (ref 15–41)
Albumin: 4.2 g/dL (ref 3.5–5.0)
Alkaline Phosphatase: 97 U/L (ref 38–126)
Anion gap: 13 (ref 5–15)
BILIRUBIN TOTAL: 0.5 mg/dL (ref 0.3–1.2)
BUN: 11 mg/dL (ref 6–20)
CO2: 24 mmol/L (ref 22–32)
Calcium: 8.3 mg/dL — ABNORMAL LOW (ref 8.9–10.3)
Chloride: 106 mmol/L (ref 98–111)
Creatinine, Ser: 1.11 mg/dL (ref 0.61–1.24)
Glucose, Bld: 96 mg/dL (ref 70–99)
POTASSIUM: 3.8 mmol/L (ref 3.5–5.1)
Sodium: 143 mmol/L (ref 135–145)
TOTAL PROTEIN: 7.5 g/dL (ref 6.5–8.1)

## 2018-06-11 LAB — CBC WITH DIFFERENTIAL/PLATELET
Basophils Absolute: 0 10*3/uL (ref 0.0–0.1)
Basophils Relative: 0 %
EOS PCT: 0 %
Eosinophils Absolute: 0 10*3/uL (ref 0.0–0.7)
HCT: 46.5 % (ref 39.0–52.0)
Hemoglobin: 16 g/dL (ref 13.0–17.0)
LYMPHS ABS: 1.6 10*3/uL (ref 0.7–4.0)
LYMPHS PCT: 11 %
MCH: 33.2 pg (ref 26.0–34.0)
MCHC: 34.4 g/dL (ref 30.0–36.0)
MCV: 96.5 fL (ref 78.0–100.0)
MONO ABS: 0.9 10*3/uL (ref 0.1–1.0)
Monocytes Relative: 6 %
Neutro Abs: 12.5 10*3/uL — ABNORMAL HIGH (ref 1.7–7.7)
Neutrophils Relative %: 83 %
PLATELETS: 248 10*3/uL (ref 150–400)
RBC: 4.82 MIL/uL (ref 4.22–5.81)
RDW: 13.6 % (ref 11.5–15.5)
WBC: 15 10*3/uL — ABNORMAL HIGH (ref 4.0–10.5)

## 2018-06-11 LAB — RAPID URINE DRUG SCREEN, HOSP PERFORMED
Amphetamines: NOT DETECTED
BENZODIAZEPINES: NOT DETECTED
Barbiturates: NOT DETECTED
COCAINE: NOT DETECTED
Tetrahydrocannabinol: NOT DETECTED

## 2018-06-11 LAB — ACETAMINOPHEN LEVEL: Acetaminophen (Tylenol), Serum: <10 ug/mL — ABNORMAL LOW (ref 10–30)

## 2018-06-11 LAB — ETHANOL: ALCOHOL ETHYL (B): 242 mg/dL — AB (ref <10)

## 2018-06-11 LAB — SALICYLATE LEVEL: Salicylate Lvl: 7.1 mg/dL (ref 2.8–30.0)

## 2018-06-11 MED ORDER — NALOXONE HCL 0.4 MG/ML IJ SOLN: 0.4000 mg | INTRAMUSCULAR | Status: DC | PRN

## 2018-06-11 MED ORDER — SODIUM CHLORIDE 0.9 % IV SOLN
INTRAVENOUS | Status: DC
  Administered 2018-06-12: 1000 mL via INTRAVENOUS
  Administered 2018-06-12 – 2018-06-15 (×8): via INTRAVENOUS

## 2018-06-11 MED ORDER — ADULT MULTIVITAMIN W/MINERALS CH
1.0000 | ORAL_TABLET | Freq: Every day | ORAL | Status: DC
  Administered 2018-06-12 – 2018-06-16 (×5): 1 via ORAL
  Filled 2018-06-11 (×5): qty 1

## 2018-06-11 MED ORDER — FOLIC ACID 1 MG PO TABS
1.0000 mg | ORAL_TABLET | Freq: Every day | ORAL | Status: DC
  Administered 2018-06-12 – 2018-06-16 (×5): 1 mg via ORAL
  Filled 2018-06-11 (×5): qty 1

## 2018-06-11 MED ORDER — THIAMINE HCL 100 MG/ML IJ SOLN
100.0000 mg | Freq: Every day | INTRAMUSCULAR | Status: DC
  Administered 2018-06-12: 100 mg via INTRAVENOUS
  Filled 2018-06-11: qty 2

## 2018-06-11 NOTE — ED Triage Notes (Signed)
Per EMS, pt from home, his roommates reports pt was found unresponsive.  Pt reports taking a handful of methadone around 1600.  His roommates reported the methadone is missing from the safe.  Medical illustratorire dept gave him narcan 4mg  IN on scene.

## 2018-06-11 NOTE — ED Notes (Signed)
Bed: [id number] Expected date:  Expected time:  Means of arrival:  Comments: EMS/overdose

## 2018-06-11 NOTE — H&P (Signed)
Riley SermonRobert G [name] ZOX:[id number] DOB: [birth year] DOA: 06/11/2018     PCP: Patient, No Pcp Per   Outpatient Specialists:  NONE   Patient arrived to ER on 06/11/18 at 1743  Patient coming from:   home Lives   With roomates    Chief Complaint:  Chief Complaint  Patient presents with  . Drug Overdose    HPI: Riley SermonRobert G [name] is a 27 y.o. male with medical history significant of alcohol abuse, depression suicide attempts    Presented with   overdose of unknown substance was found unresponsive possibly take a handful of methadone at 4 PM.  Patient stated to nursing staff that he was just trying to get rid of some medications. Apparently his friends Mother was going to flush them down the toylet but he took it instead. Thinks there was about 10 tabs 5 mg each. Currently denies suicidal ideation but still under influence. States only drinks few beers.  Roommates noted that methadone was missing.  Multiple 5 mg tablets unsure how many he took. EMS was called administered Narcan and patient became more responsive denied any chest pain or shortness of breath.  According to medication list he is on methadone 5 mg a day but states that's not prescribed he has not been to a doctor in 1 year.  Patient has had prior admissions for overdose and attempted suicide once with insulin out of time with blood pressure medications  Pt drinks on regular bases. reports hx of withdrawals in the past.  Given that currently patient appears to be intoxicated unable to obtain reliable history.  History of suicide attempts in the past in similar circumstances While in ER:  Spoke to poison control rec. 24 h observation CO2 monitoring ECG WNL The following Work up has been ordered so far:  Orders Placed This Encounter  Procedures  . DG Chest Portable 1 View  . Comprehensive metabolic panel  . Ethanol  . Urine rapid drug screen (hosp performed)  . CBC with Diff  . Salicylate level  . Acetaminophen level  .  Initiate Carrier Fluid Protocol  . Cardiac monitoring  . Search Patient Belongings  . Safety precautions 1 to 1 observation  . Consult to social work  . Consult to hospitalist  . Pulse oximetry, continuous  . End-tidal CO2  . ED EKG  . EKG 12-Lead  . ED EKG    Following Medications were ordered in ER: Medications - No data to display  Significant initial  Findings: Abnormal Labs Reviewed  COMPREHENSIVE METABOLIC PANEL - Abnormal; Notable for the following components:      Result Value   Calcium 8.3 (*)    AST 45 (*)    All other components within normal limits  ETHANOL - Abnormal; Notable for the following components:   Alcohol, Ethyl (B) 242 (*)    All other components within normal limits  RAPID URINE DRUG SCREEN, HOSP PERFORMED - Abnormal; Notable for the following components:   Opiates   (*)    Value: Result not available. Reagent lot number recalled by manufacturer.   All other components within normal limits  CBC WITH DIFFERENTIAL/PLATELET - Abnormal; Notable for the following components:   WBC 15.0 (*)    Neutro Abs 12.5 (*)    All other components within normal limits  ACETAMINOPHEN LEVEL - Abnormal; Notable for the following components:   Acetaminophen (Tylenol), Serum <10 (*)    All other components within normal limits     Na  143 K 3.8  Cr   stable,   Lab Results  Component Value Date   CREATININE 1.11 06/11/2018   CREATININE 0.91 12/29/2017   CREATININE 0.87 12/21/2017    EtOH 242  WBC  15  HG/HCT  Stable,     Component Value Date/Time   HGB 16.0 06/11/2018 1839   HCT 46.5 06/11/2018 1839       Troponin (Point of Care Test) No results for input(s): TROPIPOC in the last 72 hours.       UA   no evidence of UTI        CXR -  NON acute     ECG:  Personally reviewed by me showing: HR : 93 Rhythm:  NSR,    no evidence of ischemic changes QTC 439       ED Triage Vitals  Enc Vitals Group     BP 06/11/18 1826 113/75      Pulse Rate 06/11/18 1826 97     Resp 06/11/18 1826 13     Temp 06/11/18 1826 97.7 F (36.5 C)     Temp Source 06/11/18 1826 Axillary     SpO2 06/11/18 1826 (!) 87 %     Weight --      Height --      Head Circumference --      Peak Flow --      Pain Score 06/11/18 1829 Asleep     Pain Loc --      Pain Edu? --      Excl. in GC? --   TMAX(24)@       Latest  Blood pressure 111/81, pulse 95, temperature 97.7 F (36.5 C), temperature source Axillary, resp. rate 14, SpO2 98 %.   Hospitalist was called for admission for methadone overdose with unclear intent   Review of Systems:    Pertinent positives include: Patient is currently unreliable historian given acutely intoxicated  Review of systems have been  attempted but unable to obtain  Past Medical History:   Past Medical History:  Diagnosis Date  . Alcoholism (HCC)       History reviewed. No pertinent surgical history.  Social History:  Ambulatory   Independently      reports that he has been smoking cigarettes. He has been smoking about 2.00 packs per day. He has never used smokeless tobacco. He reports that he drinks alcohol. He reports that he does not use drugs.     Family History:   Family History  Problem Relation Age of Onset  . Hypertension Other   . Diabetes Other     Allergies: No Known Allergies   Prior to Admission medications   Medication Sig Start Date End Date Taking? Authorizing Provider  Aspirin-Salicylamide-Caffeine (BC HEADACHE PO) Take 1 packet by mouth daily as needed (headache).   Yes [provider]  methadone (DOLOPHINE) 5 MG tablet Take 5 mg by mouth once.   Yes [provider]  citalopram (CELEXA) 20 MG tablet Take 1 tablet (20 mg total) by mouth daily. for mood control Patient not taking: Reported on 06/11/2018 12/24/17   Money, Gerlene Burdock, FNP  gabapentin (NEURONTIN) 300 MG capsule Take 1 capsule (300 mg total) by mouth 3 (three) times daily. Patient not taking:  Reported on 06/11/2018 12/23/17   Money, Gerlene Burdock, FNP  hydrOXYzine (ATARAX/VISTARIL) 50 MG tablet Take 1 tablet (50 mg total) by mouth 3 (three) times daily as needed for anxiety. Patient not taking: Reported on 06/11/2018 12/23/17  Money, Gerlene Burdock, FNP  QUEtiapine (SEROQUEL) 200 MG tablet Take 1 tablet (200 mg total) by mouth at bedtime. For mood control Patient not taking: Reported on 06/11/2018 12/23/17   Money, Gerlene Burdock, FNP  QUEtiapine (SEROQUEL) 50 MG tablet Take 1 tablet (50 mg total) by mouth 2 (two) times daily. For mood control Patient not taking: Reported on 06/11/2018 12/23/17   Money, Gerlene Burdock, FNP  traZODone (DESYREL) 50 MG tablet Take 1 tablet (50 mg total) by mouth at bedtime as needed for sleep. Patient not taking: Reported on 06/11/2018 12/23/17   Money, Gerlene Burdock, FNP   Physical Exam: Blood pressure 111/81, pulse 95, temperature 97.7 F (36.5 C), temperature source Axillary, resp. rate 14, SpO2 98 %. 1. General:  in No Acute distress   acutely intoxicated -appearing 2. Psychological: lethargic and  Oriented 3. Head/ENT:     Dry Mucous Membranes                          Head Non traumatic, neck supple                            Poor Dentition 4. SKIN:  decreased Skin turgor,  Skin clean Dry and intact no rash 5. Heart: Regular rate and rhythm no  Murmur, no Rub or gallop 6. Lungs:  no wheezes or crackles   7. Abdomen: Soft,  non-tender, Non distended  bowel sounds present 8. Lower extremities: no clubbing, cyanosis, or edema, monitoring bracelent on right ankle 9. Neurologically Grossly intact, moving all 4 extremities equally  10. MSK: Normal range of motion   LABS:     Recent Labs  Lab 06/11/18 1839  WBC 15.0*  NEUTROABS 12.5*  HGB 16.0  HCT 46.5  MCV 96.5  PLT 248   Basic Metabolic Panel: Recent Labs  Lab 06/11/18 1839  NA 143  K 3.8  CL 106  CO2 24  GLUCOSE 96  BUN 11  CREATININE 1.11  CALCIUM 8.3*      Recent Labs  Lab 06/11/18 1839  AST 45*  ALT  38  ALKPHOS 97  BILITOT 0.5  PROT 7.5  ALBUMIN 4.2   No results for input(s): LIPASE, AMYLASE in the last 168 hours. No results for input(s): AMMONIA in the last 168 hours.    HbA1C: No results for input(s): HGBA1C in the last 72 hours. CBG: No results for input(s): GLUCAP in the last 168 hours.    Urine analysis:    Component Value Date/Time   COLORURINE COLORLESS (A) 12/12/2017 0401   APPEARANCEUR CLEAR 12/12/2017 0401   LABSPEC 1.002 (L) 12/12/2017 0401   PHURINE 6.0 12/12/2017 0401   GLUCOSEU NEGATIVE 12/12/2017 0401   HGBUR NEGATIVE 12/12/2017 0401   BILIRUBINUR NEGATIVE 12/12/2017 0401   KETONESUR NEGATIVE 12/12/2017 0401   PROTEINUR NEGATIVE 12/12/2017 0401   NITRITE NEGATIVE 12/12/2017 0401   LEUKOCYTESUR NEGATIVE 12/12/2017 0401       Cultures: No results found for: SDES, SPECREQUEST, CULT, REPTSTATUS   Radiological Exams on Admission: Dg Chest Portable 1 View  Result Date: 06/11/2018 CLINICAL DATA:  Recently found unresponsive from methadone overdose, initial encounter EXAM: PORTABLE CHEST 1 VIEW COMPARISON:  10/22/2017 FINDINGS: The heart size and mediastinal contours are within normal limits. Both lungs are clear. The visualized skeletal structures are unremarkable. IMPRESSION: No active disease. Electronically Signed   By: Alcide Clever M.D.   On: 06/11/2018 19:03  Chart has been reviewed    Assessment/Plan   27 y.o. male with medical history significant of alcohol abuse, depression suicide attempts  Admitted for methadone overdose with unclear intent  Present on Admission: . Alcohol abuse with alcohol-induced mood disorder [facility](HCC) behavioral health consult ordered . Alcohol abuse with intoxication (HCC) initiated CIWA protocol social work consult . Methadone overdose [facility](HCC) intention for now will order suicide precautions behavioral health consult admit to stepdown.  Administer Narcan as needed Monitor for QT prolongation Poison control has been  consulted. Unclear intentions of overdose history of suicide attempts with overdoses in the past this point too intoxicated to give clear history we will order us suicide precautions for now until able to clear Other plan as per orders.  DVT prophylaxis:  SCD      Code Status:  FULL CODE    Family Communication:   Family not  at  Bedside    Disposition Plan:      To home once workup is complete and patient is stable                      Social Work  consulted                                   Behavioral health  consulted                   Poison control has been consulted    Admission status:    obs   Level of care        SDU        Therisa Doynenastassia Britain Anagnos 06/11/2018, 11:32 PM    [facility]  Pager [phone]   after 2 AM please page floor coverage PA If 7AM-7PM, please contact the day team taking care of the patient  [email or web address]  Password [email or web address]

## 2018-06-11 NOTE — ED Notes (Signed)
Pt reports he went to his friend 's house because "they needed to get rid of some medicines.  So what do I do?"  He opens his mouth and act as if he is drinking something.  Pt is lethargic but responds to voice.

## 2018-06-11 NOTE — ED Provider Notes (Signed)
[facility]-EMERGENCY DEPT Provider Note   CSN: [id number] Arrival date & time: 06/11/18  1743     History   Chief Complaint Chief Complaint  Patient presents with  . Drug Overdose    HPI Riley SermonRobert G Paar is a 27 y.o. male.  Patient arrives with EMS after receiving 4 mg of Narcan following an accidental overdose on methadone.  Patient states that he was drinking alcohol and taking his friend's methadone.  He states that he was taking multiple 5 mg methadone's at one time and thinks that he took maybe 50 to 60 mg.  Patient was found unresponsive by EMS and fire department and improved with Narcan.  Patient denies any symptoms at this time.  Denies chest pain, shortness of breath.  The history is provided by the patient and the EMS personnel.  Drug Overdose  This is a new problem. The current episode started less than 1 hour ago. The problem has been gradually improving. Pertinent negatives include no chest pain, no abdominal pain, no headaches and no shortness of breath. Nothing aggravates the symptoms. Nothing relieves the symptoms. Treatments tried: narcan. The treatment provided moderate relief.    Past Medical History:  Diagnosis Date  . Alcoholism [facility](HCC)     Patient Active Problem List   Diagnosis Date Noted  . Methadone overdose (HCC) 06/11/2018  . Alcohol abuse with alcohol-induced mood disorder (HCC) 12/30/2017  . Severe recurrent major depression without psychotic features (HCC) 12/21/2017  . MDD (major depressive disorder), severe (HCC) 12/13/2017  . Alcoholism (HCC) 12/12/2017  . Hypokalemia 12/12/2017  . Suicidal ideations 12/12/2017  . Hypoglycemia due to insulin 12/12/2017  . Alcohol abuse with intoxication (HCC) 12/12/2017  . Hypoglycemia 12/12/2017  . Insulin overdose     History reviewed. No pertinent surgical history.      Home Medications    Prior to Admission medications   Medication Sig Start Date End Date Taking? Authorizing  Provider  Aspirin-Salicylamide-Caffeine (BC HEADACHE PO) Take 1 packet by mouth daily as needed (headache).   Yes [provider]  methadone (DOLOPHINE) 5 MG tablet Take 5 mg by mouth once.   Yes [provider]  citalopram (CELEXA) 20 MG tablet Take 1 tablet (20 mg total) by mouth daily. for mood control Patient not taking: Reported on 06/11/2018 12/24/17   Money, [name], FNP  gabapentin (NEURONTIN) 300 MG capsule Take 1 capsule (300 mg total) by mouth 3 (three) times daily. Patient not taking: Reported on 06/11/2018 12/23/17   Money, [name], FNP  hydrOXYzine (ATARAX/VISTARIL) 50 MG tablet Take 1 tablet (50 mg total) by mouth 3 (three) times daily as needed for anxiety. Patient not taking: Reported on 06/11/2018 12/23/17   Money, [name], FNP  QUEtiapine (SEROQUEL) 200 MG tablet Take 1 tablet (200 mg total) by mouth at bedtime. For mood control Patient not taking: Reported on 06/11/2018 12/23/17   Money, [name], FNP  QUEtiapine (SEROQUEL) 50 MG tablet Take 1 tablet (50 mg total) by mouth 2 (two) times daily. For mood control Patient not taking: Reported on 06/11/2018 12/23/17   Money, [name], FNP  traZODone (DESYREL) 50 MG tablet Take 1 tablet (50 mg total) by mouth at bedtime as needed for sleep. Patient not taking: Reported on 06/11/2018 12/23/17   Money, [name], FNP    Family History Family History  Family history unknown: Yes    Social History Social History   Tobacco Use  . Smoking status: Current Every Day Smoker  Packs/day: 2.00    Types: Cigarettes  . Smokeless tobacco: Never Used  Substance Use Topics  . Alcohol use: Yes    Comment: Pt stated "I drink  1/2 gal of 100 proof every day"  . Drug use: No     Allergies   Patient has no known allergies.   Review of Systems Review of Systems  Constitutional: Negative for chills and fever.  HENT: Negative for ear pain and sore throat.   Eyes: Negative for pain and visual disturbance.  Respiratory: Negative  for cough and shortness of breath.   Cardiovascular: Negative for chest pain and palpitations.  Gastrointestinal: Negative for abdominal pain and vomiting.  Genitourinary: Negative for dysuria and hematuria.  Musculoskeletal: Negative for arthralgias and back pain.  Skin: Negative for color change and rash.  Neurological: Negative for seizures, syncope and headaches.  All other systems reviewed and are negative.    Physical Exam Updated Vital Signs  ED Triage Vitals  Enc Vitals Group     BP 06/11/18 1826 113/75     Pulse Rate 06/11/18 1826 97     Resp 06/11/18 1826 13     Temp 06/11/18 1826 97.7 F (36.5 C)     Temp Source 06/11/18 1826 Axillary     SpO2 06/11/18 1826 (!) 87 %     Weight --      Height --      Head Circumference --      Peak Flow --      Pain Score 06/11/18 1829 Asleep     Pain Loc --      Pain Edu? --      Excl. in GC? --     Physical Exam  Constitutional: He is oriented to person, place, and time. He appears well-developed and well-nourished.  HENT:  Head: Normocephalic and atraumatic.  Mouth/Throat: No oropharyngeal exudate.  Eyes: Pupils are equal, round, and reactive to light. Conjunctivae and EOM are normal.  Pinpoint pupils  Neck: Normal range of motion. Neck supple.  Cardiovascular: Normal rate, regular rhythm, normal heart sounds and intact distal pulses.  No murmur heard. Pulmonary/Chest: Effort normal and breath sounds normal. No respiratory distress.  Abdominal: Soft. Bowel sounds are normal. He exhibits no distension. There is no tenderness.  Musculoskeletal: He exhibits no edema.  Neurological: He is alert and oriented to person, place, and time. No cranial nerve deficit or sensory deficit. He exhibits normal muscle tone. Coordination normal.  Skin: Skin is warm and dry.  Psychiatric: He has a normal mood and affect.  Nursing note and vitals reviewed.    ED Treatments / Results  Labs (all labs ordered are listed, but only abnormal  results are displayed) Labs Reviewed  COMPREHENSIVE METABOLIC PANEL - Abnormal; Notable for the following components:      Result Value   Calcium 8.3 (*)    AST 45 (*)    All other components within normal limits  ETHANOL - Abnormal; Notable for the following components:   Alcohol, Ethyl (B) 242 (*)    All other components within normal limits  RAPID URINE DRUG SCREEN, HOSP PERFORMED - Abnormal; Notable for the following components:   Opiates   (*)    Value: Result not available. Reagent lot number recalled by manufacturer.   All other components within normal limits  CBC WITH DIFFERENTIAL/PLATELET - Abnormal; Notable for the following components:   WBC 15.0 (*)    Neutro Abs 12.5 (*)    All other components within  normal limits  ACETAMINOPHEN LEVEL - Abnormal; Notable for the following components:   Acetaminophen (Tylenol), Serum <10 (*)    All other components within normal limits  SALICYLATE LEVEL    EKG EKG Interpretation  Date/Time:  Wednesday June 11 2018 19:04:30 EDT Ventricular Rate:  93 PR Interval:    QRS Duration: 85 QT Interval:  353 QTC Calculation: 439 R Axis:   92 Text Interpretation:  Sinus rhythm Borderline right axis deviation ST elev, probable normal early repol pattern Baseline wander in lead(s) V5 V6 Confirmed by Virgina NorfolkAdam, Capria Cartaya [id number]) on 06/11/2018 7:48:58 PM   Radiology Dg Chest Portable 1 View  Result Date: 06/11/2018 CLINICAL DATA:  Recently found unresponsive from methadone overdose, initial encounter EXAM: PORTABLE CHEST 1 VIEW COMPARISON:  10/22/2017 FINDINGS: The heart size and mediastinal contours are within normal limits. Both lungs are clear. The visualized skeletal structures are unremarkable. IMPRESSION: No active disease. Electronically Signed   By: Alcide CleverMark  Lukens M.D.   On: 06/11/2018 19:03    Procedures Procedures (including critical care time)  Medications Ordered in ED Medications - No data to display   Initial Impression /  Assessment and Plan / ED Course  I have reviewed the triage vital signs and the nursing notes.  Pertinent labs & imaging results that were available during my care of the patient were reviewed by me and considered in my medical decision making (see chart for details).     Riley [name] is a 27 year old male with history of alcohol abuse, depression who presents to the ED after possible accidental overdose on methadone and alcohol use.  Patient with hypoxia upon arrival.  Otherwise unremarkable vitals.  Patient states that he took a handful of methadone 5 mg tablets of about may be 50 to 60 mg.  Patient took them all at once or drink alcohol today.  He denies any suicidal attempts.  States that it was accidental.  Patient still with pinpoint pupils but has good end-tidal CO2.  Oxygen improved with 2 L of oxygen.  Poison control was called and they recommend 24-hour observation as patient did need to receive 4 mg of Narcan due to CNS depression in the field.  Patient was found unresponsive by EMS after friend called.  Patient alert and oriented now.  Patient with elevated alcohol.  Otherwise EKG shows no acute findings.  Intervals within normal limits.  Patient had chest x-ray that showed no pneumonia, pneumothorax, pleural effusion.  Patient stable on 2 L of oxygen and continued to have good end-tidal CO2.  Patient to be admitted to medicine service for further observation given hypoxia and methadone overdose.  Hemodynamically stable throughout my care. Neuro intact, no falls.  This chart was dictated using voice recognition software.  Despite best efforts to proofread,  errors can occur which can change the documentation meaning.   Final Clinical Impressions(s) / ED Diagnoses   Final diagnoses:  Accidental methadone overdose, initial encounter [facility](HCC)  Alcohol abuse  Acute respiratory failure with hypoxia [facility](HCC)    ED Discharge Orders    None       Virgina NorfolkCuratolo, Eriel Dunckel, DO 06/11/18 2147

## 2018-06-11 NOTE — ED Notes (Signed)
Pt continues to take the Johnson off causing his sats to decrease

## 2018-06-11 NOTE — ED Notes (Signed)
Pt continues to ask for something to drink.  Made him aware that he is not alert enough and is too lethargic to have a drink.  He states his tongue is dry and is dehydrated.  Wet wash cloth given to pt.

## 2018-06-12 ENCOUNTER — Other Ambulatory Visit: Payer: Self-pay

## 2018-06-12 DIAGNOSIS — R4182 Altered mental status, unspecified: Secondary | ICD-10-CM

## 2018-06-12 DIAGNOSIS — R45851 Suicidal ideations: Secondary | ICD-10-CM

## 2018-06-12 DIAGNOSIS — F10129 Alcohol abuse with intoxication, unspecified: Secondary | ICD-10-CM

## 2018-06-12 DIAGNOSIS — T50901A Poisoning by unspecified drugs, medicaments and biological substances, accidental (unintentional), initial encounter: Secondary | ICD-10-CM

## 2018-06-12 DIAGNOSIS — F1014 Alcohol abuse with alcohol-induced mood disorder: Secondary | ICD-10-CM

## 2018-06-12 DIAGNOSIS — T403X4A Poisoning by methadone, undetermined, initial encounter: Secondary | ICD-10-CM

## 2018-06-12 DIAGNOSIS — Z720 Tobacco use: Secondary | ICD-10-CM

## 2018-06-12 LAB — COMPREHENSIVE METABOLIC PANEL
ALT: 33 U/L (ref 0–44)
AST: 38 U/L (ref 15–41)
Albumin: 3.8 g/dL (ref 3.5–5.0)
Alkaline Phosphatase: 88 U/L (ref 38–126)
Anion gap: 12 (ref 5–15)
BILIRUBIN TOTAL: 0.6 mg/dL (ref 0.3–1.2)
BUN: 14 mg/dL (ref 6–20)
CO2: 27 mmol/L (ref 22–32)
CREATININE: 1.05 mg/dL (ref 0.61–1.24)
Calcium: 8.1 mg/dL — ABNORMAL LOW (ref 8.9–10.3)
Chloride: 97 mmol/L — ABNORMAL LOW (ref 98–111)
Glucose, Bld: 126 mg/dL — ABNORMAL HIGH (ref 70–99)
POTASSIUM: 4 mmol/L (ref 3.5–5.1)
Sodium: 136 mmol/L (ref 135–145)
TOTAL PROTEIN: 7 g/dL (ref 6.5–8.1)

## 2018-06-12 LAB — CBC
HEMATOCRIT: 43.8 % (ref 39.0–52.0)
Hemoglobin: 14.4 g/dL (ref 13.0–17.0)
MCH: 33 pg (ref 26.0–34.0)
MCHC: 32.9 g/dL (ref 30.0–36.0)
MCV: 100.2 fL — AB (ref 78.0–100.0)
PLATELETS: 231 10*3/uL (ref 150–400)
RBC: 4.37 MIL/uL (ref 4.22–5.81)
RDW: 13.9 % (ref 11.5–15.5)
WBC: 10.9 10*3/uL — ABNORMAL HIGH (ref 4.0–10.5)

## 2018-06-12 LAB — MRSA PCR SCREENING: MRSA by PCR: NEGATIVE

## 2018-06-12 MED ORDER — FAMOTIDINE IN NACL 20-0.9 MG/50ML-% IV SOLN
20.0000 mg | Freq: Two times a day (BID) | INTRAVENOUS | Status: DC
  Administered 2018-06-12 – 2018-06-15 (×7): 20 mg via INTRAVENOUS
  Filled 2018-06-12 (×7): qty 50

## 2018-06-12 MED ORDER — DIPHENHYDRAMINE-ZINC ACETATE 2-0.1 % EX CREA
TOPICAL_CREAM | Freq: Three times a day (TID) | CUTANEOUS | Status: DC | PRN
  Administered 2018-06-12 – 2018-06-13 (×2): 1 via TOPICAL
  Filled 2018-06-12: qty 28

## 2018-06-12 MED ORDER — CHLORHEXIDINE GLUCONATE 0.12 % MT SOLN
15.0000 mL | Freq: Two times a day (BID) | OROMUCOSAL | Status: DC
  Administered 2018-06-13 – 2018-06-16 (×3): 15 mL via OROMUCOSAL
  Filled 2018-06-12 (×4): qty 15

## 2018-06-12 MED ORDER — TRAZODONE HCL 50 MG PO TABS: 50.0000 mg | ORAL_TABLET | Freq: Every evening | ORAL | Status: DC | PRN

## 2018-06-12 MED ORDER — NICOTINE 21 MG/24HR TD PT24
21.0000 mg | MEDICATED_PATCH | Freq: Every day | TRANSDERMAL | Status: DC
  Administered 2018-06-12 – 2018-06-16 (×5): 21 mg via TRANSDERMAL
  Filled 2018-06-12 (×5): qty 1

## 2018-06-12 MED ORDER — CHLORDIAZEPOXIDE HCL 25 MG PO CAPS
50.0000 mg | ORAL_CAPSULE | Freq: Once | ORAL | Status: AC
  Administered 2018-06-12: 50 mg via ORAL
  Filled 2018-06-12: qty 2

## 2018-06-12 MED ORDER — DIPHENHYDRAMINE HCL 25 MG PO CAPS: 25.0000 mg | ORAL_CAPSULE | Freq: Four times a day (QID) | ORAL | Status: DC | PRN

## 2018-06-12 MED ORDER — ORAL CARE MOUTH RINSE
15.0000 mL | Freq: Two times a day (BID) | OROMUCOSAL | Status: DC
  Administered 2018-06-12 – 2018-06-13 (×4): 15 mL via OROMUCOSAL

## 2018-06-12 MED ORDER — HYDROXYZINE HCL 10 MG PO TABS
10.0000 mg | ORAL_TABLET | Freq: Three times a day (TID) | ORAL | Status: DC | PRN
  Administered 2018-06-12: 10 mg via ORAL
  Filled 2018-06-12: qty 1

## 2018-06-12 NOTE — Consult Note (Addendum)
PULMONARY / CRITICAL CARE MEDICINE   Name: Riley SermonRobert G Defibaugh MRN: [id number] DOB: 17-Jan-1991    ADMISSION DATE:  06/11/2018 CONSULTATION DATE: 06/12/2018 REFERRING MD: Dr. Catha GosselinMikhail  CHIEF COMPLAINT: Altered mental status  HISTORY OF PRESENT ILLNESS:   27 year old male who presented to [facility], ER on 8/21 with reports of altered mental status.  Patient arrived to the ER via EMS when his roommates found him unresponsive.  They reported on admission that he had taken a handful of methadone around 4 PM on 8/21.  His roommates reported that methadone was missing from their safe.  On scene the patient received Narcan from the fire department.  On arrival to the ER the patient reported he was drinking alcohol with his friends and taking methadone.  He is taking 5 mg methadone tablets one at a time and thinks he may be took 50 to 60 mg.  The patient was admitted per [facility] hospitalist for further evaluation.  The patient was placed on CO2 monitoring with PCO2 ranging from 40-60.  Labs on admission within normal limits.  Patient's alcohol level was 242 on presentation.  Chest x-ray showed no active disease.    PCCM consulted 8/22 for evaluation of altered mental status.  PAST MEDICAL HISTORY :  He  has a past medical history of Alcoholism (HCC).  PAST SURGICAL HISTORY: He  has no past surgical history on file.  No Known Allergies  No current facility-administered medications on file prior to encounter.    Current Outpatient Medications on File Prior to Encounter  Medication Sig  . Aspirin-Salicylamide-Caffeine (BC HEADACHE PO) Take 1 packet by mouth daily as needed (headache).  . methadone (DOLOPHINE) 5 MG tablet Take 5 mg by mouth once.  . citalopram (CELEXA) 20 MG tablet Take 1 tablet (20 mg total) by mouth daily. for mood control (Patient not taking: Reported on 06/11/2018)  . gabapentin (NEURONTIN) 300 MG capsule Take 1 capsule (300 mg total) by mouth 3 (three) times daily. (Patient not taking:  Reported on 06/11/2018)  . hydrOXYzine (ATARAX/VISTARIL) 50 MG tablet Take 1 tablet (50 mg total) by mouth 3 (three) times daily as needed for anxiety. (Patient not taking: Reported on 06/11/2018)  . QUEtiapine (SEROQUEL) 200 MG tablet Take 1 tablet (200 mg total) by mouth at bedtime. For mood control (Patient not taking: Reported on 06/11/2018)  . QUEtiapine (SEROQUEL) 50 MG tablet Take 1 tablet (50 mg total) by mouth 2 (two) times daily. For mood control (Patient not taking: Reported on 06/11/2018)  . traZODone (DESYREL) 50 MG tablet Take 1 tablet (50 mg total) by mouth at bedtime as needed for sleep. (Patient not taking: Reported on 06/11/2018)    FAMILY HISTORY:  His family history includes Diabetes in his other; Hypertension in his other.  SOCIAL HISTORY: He  reports that he has been smoking cigarettes. He has been smoking about 2.00 packs per day. He has never used smokeless tobacco. He reports that he drinks alcohol. He reports that he does not use drugs.  REVIEW OF SYSTEMS: Positives in bold Gen: Denies fever, chills, weight change, fatigue, night sweats HEENT: Denies blurred vision, double vision, hearing loss, tinnitus, sinus congestion, rhinorrhea, sore throat, neck stiffness, dysphagia PULM: Denies shortness of breath, cough, sputum production, hemoptysis, wheezing CV: Denies chest pain, edema, orthopnea, paroxysmal nocturnal dyspnea, palpitations.  Sweating, tremors GI: Denies abdominal pain, nausea, vomiting, diarrhea, hematochezia, melena, constipation, change in bowel habits GU: Denies dysuria, hematuria, polyuria, oliguria, urethral discharge Endocrine: Denies hot or cold intolerance,  polyuria, polyphagia or appetite change Derm: Denies rash, dry skin, scaling or peeling skin change.  Reports itching. Heme: Denies easy bruising, bleeding, bleeding gums Neuro: Denies headache, numbness, weakness, slurred speech, loss of memory or consciousness   SUBJECTIVE:   VITAL SIGNS: BP  (!) 146/93   Pulse 91   Temp 98.5 F (36.9 C) (Oral)   Resp (!) 7   Ht 5\' 7"  (1.702 m)   Wt 71.5 kg   SpO2 96%   BMI 24.69 kg/m   HEMODYNAMICS:    VENTILATOR SETTINGS:    INTAKE / OUTPUT: I/O last 3 completed shifts: In: 692.7 [P.O.:240; I.V.:452.7] Out: 600 [Urine:600]  PHYSICAL EXAMINATION: General: Young adult male lying in bed in no acute distress Neuro: Eyes closed upon entering room, arouses to voice and is fully alert during conversation, if not stimulated the patient does fall to sleep with brief periods of apnea noted, moves all extremities HEENT: MM pink/moist, face flushed, pupils equal and reactive Cardiovascular: S1-S2 RRR, sinus rhythm in 90s on monitor Lungs: Even/nonlabored, lungs bilaterally clear, coarse sounding cough. Abdomen: Soft/nontender, bowel sounds x4 active Musculoskeletal: No acute deformities, right lower extremity police ankle bracelet place Skin: Warm and dry, no edema  LABS:  BMET Recent Labs  Lab 06/11/18 1839  NA 143  K 3.8  CL 106  CO2 24  BUN 11  CREATININE 1.11  GLUCOSE 96    Electrolytes Recent Labs  Lab 06/11/18 1839  CALCIUM 8.3*    CBC Recent Labs  Lab 06/11/18 1839 06/12/18 0731  WBC 15.0* 10.9*  HGB 16.0 14.4  HCT 46.5 43.8  PLT 248 231    Coag's No results for input(s): APTT, INR in the last 168 hours.  Sepsis Markers No results for input(s): LATICACIDVEN, PROCALCITON, O2SATVEN in the last 168 hours.  ABG No results for input(s): PHART, PCO2ART, PO2ART in the last 168 hours.  Liver Enzymes Recent Labs  Lab 06/11/18 1839  AST 45*  ALT 38  ALKPHOS 97  BILITOT 0.5  ALBUMIN 4.2    Cardiac Enzymes No results for input(s): TROPONINI, PROBNP in the last 168 hours.  Glucose No results for input(s): GLUCAP in the last 168 hours.  Imaging Dg Chest Portable 1 View  Result Date: 06/11/2018 CLINICAL DATA:  Recently found unresponsive from methadone overdose, initial encounter EXAM: PORTABLE  CHEST 1 VIEW COMPARISON:  10/22/2017 FINDINGS: The heart size and mediastinal contours are within normal limits. Both lungs are clear. The visualized skeletal structures are unremarkable. IMPRESSION: No active disease. Electronically Signed   By: Alcide Clever M.D.   On: 06/11/2018 19:03     STUDIES:    CULTURES:   ANTIBIOTICS:   SIGNIFICANT EVENTS: 8/21 Admit with altered mental status, unintentional methadone overdose and alcohol intoxication  LINES/TUBES:   DISCUSSION: 27 year old male admitted 8/21 with unintentional methadone overdose and alcohol intoxication.  ASSESSMENT / PLAN:  PULMONARY A: At Risk Respiratory Insufficiency / Aspiration - in setting of overdose Tobacco Abuse  P:   O2 as needed to supports sats >90% Monitor end-tidal CO2 Pulmonary hygiene - IS, mobilize  Nicotine patch  Smoking cessation counseling when pt more alert  CARDIOVASCULAR A:  Elevated Blood Pressure P:  Monitor in SDU   NEUROLOGIC A:   ETOH Intoxication & Methadone Overdose  ETOH Abuse - drinks 6pk per day, used to drink a 1/2 gallon of 100 proof per day Accidental Overdose  Recent Incarceration - x2 P:   RASS goal: n/a Monitor for ETOH withdrawal  Trend CIWA score  Safety sitter  Thiamine, folate, MVI May need PRN ativan or precedex if significant withdrawal symptoms. This is complicated by the sedation from methadone.  If he were to need meds for withdrawal at this point, he might require sedation.  Fortunately, he does not appear to be actively withdrawing at this time.  He is comfortable when not disturbed.   FAMILY  - Updates: Patient updated on plan of care.    PCCM will follow along with you.    Canary Brim, NP-C [facility] Pgr: [phone] or if no answer [phone] 06/12/2018, 9:03 AM

## 2018-06-12 NOTE — Consult Note (Addendum)
[facility] Consult   Reason for Consult:  Overdose Referring Physician:  Dr. Ree Kida  Patient Identification: Riley [name]  MRN:  [id number] Principal Diagnosis: Drug overdose Diagnosis:   Patient Active Problem List   Diagnosis Date Noted  . Methadone overdose ([place]) [T40.3X1A] 06/11/2018  . Alcohol abuse with alcohol-induced mood disorder ([place]) [F10.14] 12/30/2017  . Severe recurrent major depression without psychotic features ([place]) [F33.2] 12/21/2017  . MDD (major depressive disorder), severe ([place]) [F32.2] 12/13/2017  . Alcoholism ([place]) [F10.20] 12/12/2017  . Hypokalemia [E87.6] 12/12/2017  . Suicidal ideations [R45.851] 12/12/2017  . Hypoglycemia due to insulin [E16.0, T38.3X5A] 12/12/2017  . Alcohol abuse with intoxication ([place]) [F10.129] 12/12/2017  . Hypoglycemia [E16.2] 12/12/2017  . Insulin overdose [T38.3X1A]     Total Time spent with patient: 1 hour  Subjective:   Riley [name] is a 27 y.o. male patient admitted with altered mental status due to Methadone overdose.  HPI:  Per chart review, patient was reportedly home with his roommates. He was found unresponsive. His roommates report that he took a handful of Methadone around 4 pm yesterday. He reports taking about 50-60 mg. There were pills missing from the safe. He was given Narcan en route. He has a history of alcohol abuse. He drinks a 6 pack daily. He used to drink 1/2 gallon of 100 proof liquor daily. UDS was negative and BAL was 242 on admission .   Of note, patient was admitted to [facility] in 12/2017 for depression with SI. He was discharged on Celexa 20 mg daily, Gabapentin 300 mg TID, Seroquel 50 mg BID, Seroquel 200 mg qhs and Trazodone 50 mg qhs PRN.   On interview, Riley [name] reports that he was drinking with his friend at his friend's home to have "fun."  He reports that he drank a few shots and took a few tablets of Methadone to get high.  He reports that the methadone belongs to his friend's  mother.  She asked him to dispose of the medication since it was old but he decided to take them without her knowledge.  He reports that he took a nap and woke up to EMS at his house.  He reports that he has taken Methadone once in the past for pain.  He denies SI for the past year.  He does admit to a suicide attempt by insulin overdose a year ago.  He denies HI or AVH.  He is not taking psychiatric medication and has been off his medication for the past year.  He reports that he stopped his medication due to barriers with follow up such as lack of transportation.  He reports problems with primary and secondary insomnia.  He denies problems with sleep.  He denies a history of manic symptoms (decreased need for sleep, increased energy, pressured speech or euphoria).  He denies access to guns or weapons.  He provides verbal consent to speak to his friend, Mariea Clonts [phone]).  His friend was contacted by phone but he was unavailable so a message was left to contact this note Probation officer.  Past Psychiatric History: Alcohol use disorder  Risk to Self:  Yes given recent overdose and unclear intent.  Risk to Others:  None. Denies HI.  Prior Inpatient Therapy:  He has been hospitalized multiple times. He was last hospitalized at [facility] in 12/2017 for SI. Prior Outpatient Therapy:  He was previously followed at [facility] and [facility].   Past Medical History:  Past Medical History:  Diagnosis Date  .  Alcoholism ([place])    History reviewed. No pertinent surgical history. Family History:  Family History  Problem Relation Age of Onset  . Hypertension Other   . Diabetes Other    Family Psychiatric  History: Mother-BPAD and GAD and father-BPAD.   Social History:  Social History   Substance and Sexual Activity  Alcohol Use Yes   Comment: Pt stated "I drink  1/2 gal of 100 proof every day"     Social History   Substance and Sexual Activity  Drug Use No    Social History   Socioeconomic History  . Marital  status: Single    Spouse name: Not on file  . Number of children: Not on file  . Years of education: Not on file  . Highest education level: Not on file  Occupational History  . Not on file  Social Needs  . Financial resource strain: Not on file  . Food insecurity:    Worry: Not on file    Inability: Not on file  . Transportation needs:    Medical: Not on file    Non-medical: Not on file  Tobacco Use  . Smoking status: Current Every Day Smoker    Packs/day: 2.00    Types: Cigarettes  . Smokeless tobacco: Never Used  Substance and Sexual Activity  . Alcohol use: Yes    Comment: Pt stated "I drink  1/2 gal of 100 proof every day"  . Drug use: No  . Sexual activity: Not Currently  Lifestyle  . Physical activity:    Days per week: Not on file    Minutes per session: Not on file  . Stress: Not on file  Relationships  . Social connections:    Talks on phone: Not on file    Gets together: Not on file    Attends religious service: Not on file    Active member of club or organization: Not on file    Attends meetings of clubs or organizations: Not on file    Relationship status: Not on file  Other Topics Concern  . Not on file  Social History Narrative  . Not on file   Additional Social History: He lives with his friend and friend's mother. He is unemployed. He was incarcerated from [age] y/o after he was convicted of rape. He has a history of heavy alcohol use and seizures related to withdrawal. He denies other illicit substance use.     Allergies:  No Known Allergies  Labs:  Results for orders placed or performed during the hospital encounter of 06/11/18 (from the past 48 hour(s))  Comprehensive metabolic panel     Status: Abnormal   Collection Time: 06/11/18  6:39 PM  Result Value Ref Range   Sodium 143 135 - 145 mmol/L   Potassium 3.8 3.5 - 5.1 mmol/L   Chloride 106 98 - 111 mmol/L   CO2 24 22 - 32 mmol/L   Glucose, Bld 96 70 - 99 mg/dL   BUN 11 6 - 20 mg/dL    Creatinine, Ser 1.11 0.61 - 1.24 mg/dL   Calcium 8.3 (L) 8.9 - 10.3 mg/dL   Total Protein 7.5 6.5 - 8.1 g/dL   Albumin 4.2 3.5 - 5.0 g/dL   AST 45 (H) 15 - 41 U/L   ALT 38 0 - 44 U/L   Alkaline Phosphatase 97 38 - 126 U/L   Total Bilirubin 0.5 0.3 - 1.2 mg/dL   GFR calc non Af Amer >60 >60 mL/min  GFR calc Af Amer >60 >60 mL/min    Comment: (NOTE) The eGFR has been calculated using the CKD EPI equation. This calculation has not been validated in all clinical situations. eGFR's persistently <60 mL/min signify possible Chronic Kidney Disease.    Anion gap 13 5 - 15    Comment: Performed at [facility], [place] [address] [place], [place] [address]  Ethanol     Status: Abnormal   Collection Time: 06/11/18  6:39 PM  Result Value Ref Range   Alcohol, Ethyl (B) 242 (H) <10 mg/dL    Comment: (NOTE) Lowest detectable limit for serum alcohol is 10 mg/dL. For medical purposes only. Performed at [facility], [place] [address] [place], [place] [address]   CBC with Diff     Status: Abnormal   Collection Time: 06/11/18  6:39 PM  Result Value Ref Range   WBC 15.0 (H) 4.0 - 10.5 K/uL   RBC 4.82 4.22 - 5.81 MIL/uL   Hemoglobin 16.0 13.0 - 17.0 g/dL   HCT 46.5 39.0 - 52.0 %   MCV 96.5 78.0 - 100.0 fL   MCH 33.2 26.0 - 34.0 pg   MCHC 34.4 30.0 - 36.0 g/dL   RDW 13.6 11.5 - 15.5 %   Platelets 248 150 - 400 K/uL   Neutrophils Relative % 83 %   Neutro Abs 12.5 (H) 1.7 - 7.7 K/uL   Lymphocytes Relative 11 %   Lymphs Abs 1.6 0.7 - 4.0 K/uL   Monocytes Relative 6 %   Monocytes Absolute 0.9 0.1 - 1.0 K/uL   Eosinophils Relative 0 %   Eosinophils Absolute 0.0 0.0 - 0.7 K/uL   Basophils Relative 0 %   Basophils Absolute 0.0 0.0 - 0.1 K/uL    Comment: Performed at [facility], [place] [address] [place], [place] [address]  Salicylate level     Status: None   Collection Time: 06/11/18  6:39 PM  Result Value Ref Range   Salicylate Lvl 7.1  2.8 - 30.0 mg/dL    Comment: Performed at [facility], [place] [address] [place], [place] [address]  Acetaminophen level     Status: Abnormal   Collection Time: 06/11/18  6:39 PM  Result Value Ref Range   Acetaminophen (Tylenol), Serum <10 (L) 10 - 30 ug/mL    Comment: (NOTE) Therapeutic concentrations vary significantly. A range of 10-30 ug/mL  may be an effective concentration for many patients. However, some  are best treated at concentrations outside of this range. Acetaminophen concentrations >150 ug/mL at 4 hours after ingestion  and >50 ug/mL at 12 hours after ingestion are often associated with  toxic reactions. Performed at [facility], [place] [address] [place], [place] [address]   Urine rapid drug screen (hosp performed)     Status: Abnormal   Collection Time: 06/11/18  6:47 PM  Result Value Ref Range   Opiates (A) NONE DETECTED    Result not available. Reagent lot number recalled by manufacturer.   Cocaine NONE DETECTED NONE DETECTED   Benzodiazepines NONE DETECTED NONE DETECTED   Amphetamines NONE DETECTED NONE DETECTED   Tetrahydrocannabinol NONE DETECTED NONE DETECTED   Barbiturates NONE DETECTED NONE DETECTED    Comment: (NOTE) DRUG SCREEN FOR MEDICAL PURPOSES ONLY.  IF CONFIRMATION IS NEEDED FOR ANY PURPOSE, NOTIFY LAB WITHIN 5 DAYS. LOWEST DETECTABLE LIMITS FOR URINE DRUG SCREEN Drug Class  Cutoff (ng/mL) Amphetamine and metabolites    1000 Barbiturate and metabolites    200 Benzodiazepine                 993 Tricyclics and metabolites     300 Opiates and metabolites        300 Cocaine and metabolites        300 THC                            50 Performed at [facility], [place] [address] [place], [place] [address]   MRSA PCR Screening     Status: None   Collection Time: 06/12/18 12:04 AM  Result Value Ref Range   MRSA by PCR NEGATIVE NEGATIVE    Comment:        The GeneXpert  MRSA Assay (FDA approved for NASAL specimens only), is one component of a comprehensive MRSA colonization surveillance program. It is not intended to diagnose MRSA infection nor to guide or monitor treatment for MRSA infections. Performed at [facility], [place] [address], [place], [place] [address]   CBC     Status: Abnormal   Collection Time: 06/12/18  7:31 AM  Result Value Ref Range   WBC 10.9 (H) 4.0 - 10.5 K/uL   RBC 4.37 4.22 - 5.81 MIL/uL   Hemoglobin 14.4 13.0 - 17.0 g/dL   HCT 43.8 39.0 - 52.0 %   MCV 100.2 (H) 78.0 - 100.0 fL   MCH 33.0 26.0 - 34.0 pg   MCHC 32.9 30.0 - 36.0 g/dL   RDW 13.9 11.5 - 15.5 %   Platelets 231 150 - 400 K/uL    Comment: Performed at [facility], [place] [address] [place], [place] [address]    Current Facility-Administered Medications  Medication Dose Route Frequency Provider Last Rate Last Dose  . 0.9 %  sodium chloride infusion   Intravenous Continuous Doutova, Anastassia, MD 100 mL/hr at 06/12/18 0500    . folic acid (FOLVITE) tablet 1 mg  1 mg Oral Daily Doutova, Anastassia, MD      . MEDLINE mouth rinse  15 mL Mouth Rinse BID Doutova, Anastassia, MD   15 mL at 06/12/18 0026  . multivitamin with minerals tablet 1 tablet  1 tablet Oral Daily Doutova, Anastassia, MD      . naloxone (NARCAN) injection 0.4 mg  0.4 mg Intravenous PRN Doutova, Anastassia, MD      . nicotine (NICODERM CQ - dosed in mg/24 hours) patch 21 mg  21 mg Transdermal Daily Mikhail, Maryann, DO      . thiamine (B-1) injection 100 mg  100 mg Intravenous Daily Toy [name], MD        Musculoskeletal: Strength & Muscle Tone: within normal limits Gait & Station: UTA since patient was lying in bed. Patient leans: N/A  Psychiatric Specialty Exam: Physical Exam  Nursing note and vitals reviewed. Constitutional: He is oriented to person, place, and time. He appears well-developed and well-nourished.  HENT:  Head: Normocephalic and  atraumatic.  Neck: Normal range of motion.  Respiratory: Effort normal.  Musculoskeletal: Normal range of motion.  Neurological: He is alert and oriented to person, place, and time.  Skin: No rash noted.  Psychiatric: He has a normal mood and affect. His speech is normal and behavior is normal. Thought content normal. Cognition and memory are normal. He expresses impulsivity.    Review of Systems  Constitutional:  Negative for chills and fever.  Cardiovascular: Negative for chest pain.  Gastrointestinal: Negative for abdominal pain, constipation, diarrhea, nausea and vomiting.  Psychiatric/Behavioral: Positive for substance abuse. Negative for depression, hallucinations and suicidal ideas. The patient has insomnia. The patient is not nervous/anxious.   All other systems reviewed and are negative.   Blood pressure (!) 146/93, pulse 91, temperature 98.5 F (36.9 C), temperature source Oral, resp. rate (!) 7, height _0  (1.702 m), weight 71.5 kg, SpO2 96 %.Body mass index is 24.69 kg/m.  General Appearance: Fairly Groomed, young, Caucasian male, wearing a hospital gown with [place] in place and short, blonde hair who is lying in bed. NAD.   Eye Contact:  Good  Speech:  Clear and Coherent and Normal Rate  Volume:  Normal  Mood:  Euthymic  Affect:  Appropriate and Congruent  Thought Process:  Goal Directed, Linear and Descriptions of Associations: Intact  Orientation:  Full (Time, Place, and Person)  Thought Content:  Logical  Suicidal Thoughts:  No  Homicidal Thoughts:  No  Memory:  Immediate;   Good Recent;   Good Remote;   Good  Judgement:  Fair  Insight:  Fair  Psychomotor Activity:  Normal  Concentration:  Concentration: Good and Attention Span: Good  Recall:  Good  Fund of Knowledge:  Good  Language:  Good  Akathisia:  No  Handed:  Right  AIMS (if indicated):   N/A  Assets:  Agricultural consultant Housing Social Support  ADL's:  Intact   Cognition:  WNL  Sleep:   Poor   Assessment:  Riley [name] is a 27 y.o. male who was admitted with altered mental status due to Methadone overdose. Patient denies SI or any intention to harm self. He reports wanting to get high from Methadone. He has a history of alcohol abuse and BAL was 242 on admission. He appears to minimize his substance abuse. He does not appear to be depressed on interview.  He has a history of suicide attempt and is therefore at higher risk of reattempting. His friend was contacted for collateral so his disposition is pending at this time.   Treatment Plan Summary: -Continue suicide precautions.  -Continue bedside sitter.  -Start a. -EKG reviewed and QTc 419. Please closely monitor when starting or increasing QTc prolonging agents.  -Monitor for alcohol withdrawal since patient appears to minimize use.  -Awaiting call from friend to obtain collateral.  -Please pursue involuntary commitment if patient attempts to leave the hospital since patient is still considered high risk for harm to self and must determine intent of Methadone overdose prior to disposition.    Disposition: Disposition pending since must obtain collateral.  Faythe Dingwall, DO 06/12/2018 10:17 AM    Addendum:   Contacted patient's friend, Mariea Clonts by phone today. He reports that he has "highs and lows" and he has mood swings and anger issues. He is unsure if patient intentionally overdosed on Methadone although he has endorsed SI within the last couple weeks. He found him in the bed and he was "blue" a couple hours later after he told his friend that he was going to take a nap. He reports that his friend has had a "rough childhood" and both of his parents abused substances. His mother died from alcohol complications a couple years ago. He has concerns for his friend's mental health and safety. He reports that if he does not receive help that he may kill himself.   Patient denied SI for  the  past year when he was seen yesterday and therefore is not forthcoming with information. He warrants inpatient psychiatric hospitalization for stabilization and safety as well as establishing outpatient care after discharge.   -Patient warrants inpatient psychiatric hospitalization given high risk of harm to self. -Continue bedside sitter.  -Continue Trazodone 50 mg qhs PRN for insomnia. -Please pursue involuntary commitment if patient refuses voluntary psychiatric hospitalization or attempts to leave the hospital.  -Will sign off on patient at this time. Please consult psychiatry again as needed.   Buford Dresser, DO 06/13/18 1:24 PM

## 2018-06-12 NOTE — Progress Notes (Signed)
End tidal CO2 64 easily arousable. Pt alert and oriented when awake.

## 2018-06-12 NOTE — Progress Notes (Signed)
PROGRESS NOTE    Riley [name]  ZOX:[id number] DOB: [birth year] DOA: 06/11/2018 PCP: Patient, No Pcp Per   Brief Narrative:  HPI on 06/11/2018 by Dr. Therisa Doyne Riley [name] is a 27 y.o. male with medical history significant of alcohol abuse, depression suicide attempts  Presented with   overdose of unknown substance was found unresponsive possibly take a handful of methadone at 4 PM.  Patient stated to nursing staff that he was just trying to get rid of some medications. Apparently his friends Mother was going to flush them down the toylet but he took it instead. Thinks there was about 10 tabs 5 mg each. Currently denies suicidal ideation but still under influence. States only drinks few beers.  Roommates noted that methadone was missing.  Multiple 5 mg tablets unsure how many he took. EMS was called administered Narcan and patient became more responsive denied any chest pain or shortness of breath.  According to medication list he is on methadone 5 mg a day but states that's not prescribed he has not been to a doctor in 1 year.  Patient has had prior admissions for overdose and attempted suicide once with insulin out of time with blood pressure medications  Pt drinks on regular bases. reports hx of withdrawals in the past.  Given that currently patient appears to be intoxicated unable to obtain reliable history.  History of suicide attempts in the past in similar circumstances  Interim history Admitted for methadone overdose in the setting of alcohol intoxication and withdrawal.  PCCM consulted for airway protection. Assessment & Plan   Methadone overdose -Patient denies any intentional harm or suicidal ideations at this time although has had a history of suicidal ideations.  He was given Narcan. -Poison control contacted and requested repeat EKG to monitor for prolonged QT. -Concern for airway protection, as patient does have periods of apnea with PCO2 ranging from 40s to 60s.     -PCCM consulted and appreciated -?  Needs Narcan drip -Pending further recommendations from PCCM -Continue safety sitter -Will consult psychiatry once mentation improves  Alcohol abuse and withdrawal -Alcohol level on admission 242 -Patient currently in cold sweat with signs of withdrawal -Placed on CIWA, current score 12 -Continue multivitamin, thiamine, folic acid -Unable to give Ativan given concern for airway protection and increased somnolence. -Will give dose of Librium and monitor closely -Placed on Benadryl cream as well as hydroxyzine 10 mg orally as needed for itchiness -Patient whether patient will need Precedex.  Pending further recommendations from PCCM  Tobacco abuse -Discussed cessation, nicotine patch ordered  Acute urinary retention -It seems that overnight, patient had over 600 cc on bladder scan and required in/out cath -Will continue to monitor  DVT Prophylaxis  SCDs  Code Status: Full  Family Communication: None at bedside  Disposition Plan: Admitted. Continue to monitor in stepdown.   Consultants PCCM  Procedures  None  Antibiotics   Anti-infectives (From admission, onward)   None      Subjective:   Riley [name] seen and examined today. Feels sleepy and states he has not slept in a while. States he will likely go into alcohol withdrawal within 24 hours and this has happened before. Denies current, nausea or vomiting, diarrhea constipation, headache or dizziness.  Cannot elaborate as to why he took the methadone pills.  Objective:   Vitals:   06/12/18 1135 06/12/18 1330 06/12/18 1400 06/12/18 1430  BP:  134/84 133/87   Pulse:  (!) 102 85 80  Resp:   13 20  Temp: 98.4 F (36.9 C)     TempSrc: Oral     SpO2:   96% 99%  Weight:      Height:        Intake/Output Summary (Last 24 hours) at 06/12/2018 1433 Last data filed at 06/12/2018 1315 Gross per 24 hour  Intake 1772.69 ml  Output 600 ml  Net 1172.69 ml   Filed Weights    06/12/18 0000  Weight: 71.5 kg    Exam  General: Well developed, well nourished, NAD, appears stated age  HEENT: NCAT, PERRLA, EOMI, Anicteic Sclera, mucous membranes moist.   Neck: Supple  Cardiovascular: S1 S2 auscultated, no rubs, murmurs or gallops. Regular rate and rhythm.  Respiratory: Essentially clear with coarse sounding cough.  No wheezing or rhonchi.  Periods of apnea.  Abdomen: Soft, nontender, nondistended, + bowel sounds  Extremities: warm dry without cyanosis clubbing or edema  Neuro: AAOx3, nonfocal.  Patient is somnolent however easily arousable.  Skin: Without rashes exudates or nodules, sweating  Psych: Normal affect and demeanor   Data Reviewed: I have personally reviewed following labs and imaging studies  CBC: Recent Labs  Lab 06/11/18 1839 06/12/18 0731  WBC 15.0* 10.9*  NEUTROABS 12.5*  --   HGB 16.0 14.4  HCT 46.5 43.8  MCV 96.5 100.2*  PLT 248 231   Basic Metabolic Panel: Recent Labs  Lab 06/11/18 1839 06/12/18 0731  NA 143 136  K 3.8 4.0  CL 106 97*  CO2 24 27  GLUCOSE 96 126*  BUN 11 14  CREATININE 1.11 1.05  CALCIUM 8.3* 8.1*   GFR: Estimated Creatinine Clearance: 100.5 mL/min (by C-G formula based on SCr of 1.05 mg/dL). Liver Function Tests: Recent Labs  Lab 06/11/18 1839 06/12/18 0731  AST 45* 38  ALT 38 33  ALKPHOS 97 88  BILITOT 0.5 0.6  PROT 7.5 7.0  ALBUMIN 4.2 3.8   No results for input(s): LIPASE, AMYLASE in the last 168 hours. No results for input(s): AMMONIA in the last 168 hours. Coagulation Profile: No results for input(s): INR, PROTIME in the last 168 hours. Cardiac Enzymes: No results for input(s): CKTOTAL, CKMB, CKMBINDEX, TROPONINI in the last 168 hours. BNP (last 3 results) No results for input(s): PROBNP in the last 8760 hours. HbA1C: No results for input(s): HGBA1C in the last 72 hours. CBG: No results for input(s): GLUCAP in the last 168 hours. Lipid Profile: No results for input(s):  CHOL, HDL, LDLCALC, TRIG, CHOLHDL, LDLDIRECT in the last 72 hours. Thyroid Function Tests: No results for input(s): TSH, T4TOTAL, FREET4, T3FREE, THYROIDAB in the last 72 hours. Anemia Panel: No results for input(s): VITAMINB12, FOLATE, FERRITIN, TIBC, IRON, RETICCTPCT in the last 72 hours. Urine analysis:    Component Value Date/Time   COLORURINE COLORLESS (A) 12/12/2017 0401   APPEARANCEUR CLEAR 12/12/2017 0401   LABSPEC 1.002 (L) 12/12/2017 0401   PHURINE 6.0 12/12/2017 0401   GLUCOSEU NEGATIVE 12/12/2017 0401   HGBUR NEGATIVE 12/12/2017 0401   BILIRUBINUR NEGATIVE 12/12/2017 0401   KETONESUR NEGATIVE 12/12/2017 0401   PROTEINUR NEGATIVE 12/12/2017 0401   NITRITE NEGATIVE 12/12/2017 0401   LEUKOCYTESUR NEGATIVE 12/12/2017 0401   Sepsis Labs: @LABRCNTIP (procalcitonin:4,lacticidven:4)  ) Recent Results (from the past 240 hour(s))  MRSA PCR Screening     Status: None   Collection Time: 06/12/18 12:04 AM  Result Value Ref Range Status   MRSA by PCR NEGATIVE NEGATIVE Final    Comment:  The GeneXpert MRSA Assay (FDA approved for NASAL specimens only), is one component of a comprehensive MRSA colonization surveillance program. It is not intended to diagnose MRSA infection nor to guide or monitor treatment for MRSA infections. Performed at [facility], [address] [place], [place] [id number]       Radiology Studies: Dg Chest Portable 1 View  Result Date: 06/11/2018 CLINICAL DATA:  Recently found unresponsive from methadone overdose, initial encounter EXAM: PORTABLE CHEST 1 VIEW COMPARISON:  10/22/2017 FINDINGS: The heart size and mediastinal contours are within normal limits. Both lungs are clear. The visualized skeletal structures are unremarkable. IMPRESSION: No active disease. Electronically Signed   By: Alcide CleverMark  Lukens M.D.   On: 06/11/2018 19:03     Scheduled Meds: . folic acid  1 mg Oral Daily  . mouth rinse  15 mL Mouth Rinse BID  .  multivitamin with minerals  1 tablet Oral Daily  . nicotine  21 mg Transdermal Daily  . thiamine injection  100 mg Intravenous Daily   Continuous Infusions: . sodium chloride 1,000 mL (06/12/18 1040)     LOS: 0 days   Time Spent in minutes   45 minutes  Riley [name] D.O. on 06/12/2018 at 2:33 PM  Between 7am to 7pm - Please see pager noted on [email or web address]  After 7pm go to [email or web address]  And look for the night coverage person covering for me after hours  [facility] Office  [phone]

## 2018-06-12 NOTE — Progress Notes (Addendum)
eLink Physician-Brief Progress Note Patient Name: Riley SermonRobert G Baumgarner DOB: Apr 17, 1991 MRN: [id number]   Date of Service  06/12/2018  HPI/Events of Note  Sats intermittently dropping into the 60's while patient asleep. OSA? Sats now = 99% while awake.   eICU Interventions  Will order: 1. BiPAP PRN 2. Watch respiratory status closely.  3. Bedside and eLink nursing notified to watch closely.      Intervention Category Major Interventions: Hypoxemia - evaluation and management  Sommer,Steven Dennard Nipugene 06/12/2018, 7:56 PM

## 2018-06-12 NOTE — Care Management Note (Signed)
Case Management Note  Patient Details  Name: Riley SermonRobert G Grace MRN: [id number] Date of Birth: [birth year]  Subjective/Objective:                  27 year old male who presented to [facility], ER on 8/21 with reports of altered mental status.  Patient arrived to the ER via EMS when his roommates found him unresponsive.  They reported on admission that he had taken a handful of methadone around 4 PM on 8/21.  His roommates reported that methadone was missing from their safe.  On scene the patient received Narcan from the fire department.  On arrival to the ER the patient reported he was drinking alcohol with his friends and taking methadone.  He is taking 5 mg methadone tablets one at a time and thinks he may be took 50 to 60 mg.  The patient was admitted per [facility] hospitalist for further evaluation.  The patient was placed on CO2 monitoring with PCO2 ranging from 40-60.  Labs on admission within normal limits.  Patient's alcohol level was 242 on presentation.  Chest x-ray showed no active disease.     Action/Plan: Progression-will follow for etoh w/d and other care. CM -following for needs-referral to csw due to homeless issues done.  Expected Discharge Date:  (unknown)               Expected Discharge Plan:     In-House Referral:     Discharge planning Services     Post Acute Care Choice:    Choice offered to:     DME Arranged:    DME Agency:     HH Arranged:    HH Agency:     Status of Service:     If discussed at [employer] Length of [employer] Meetings, dates discussed:    Additional Comments:  Golda AcreDavis, Mandeep Ferch Lynn, RN 06/12/2018, 9:37 AM

## 2018-06-13 ENCOUNTER — Other Ambulatory Visit: Payer: Self-pay

## 2018-06-13 LAB — CBC
HCT: 45.5 % (ref 39.0–52.0)
Hemoglobin: 14.5 g/dL (ref 13.0–17.0)
MCH: 32.2 pg (ref 26.0–34.0)
MCHC: 31.9 g/dL (ref 30.0–36.0)
MCV: 101.1 fL — ABNORMAL HIGH (ref 78.0–100.0)
Platelets: 194 10*3/uL (ref 150–400)
RBC: 4.5 MIL/uL (ref 4.22–5.81)
RDW: 13.6 % (ref 11.5–15.5)
WBC: 9.4 10*3/uL (ref 4.0–10.5)

## 2018-06-13 LAB — BASIC METABOLIC PANEL WITH GFR
Anion gap: 8 (ref 5–15)
BUN: 7 mg/dL (ref 6–20)
CO2: 28 mmol/L (ref 22–32)
Calcium: 8.1 mg/dL — ABNORMAL LOW (ref 8.9–10.3)
Chloride: 98 mmol/L (ref 98–111)
Creatinine, Ser: 0.8 mg/dL (ref 0.61–1.24)
GFR calc Af Amer: >60 mL/min
GFR calc non Af Amer: >60 mL/min
Glucose, Bld: 102 mg/dL — ABNORMAL HIGH (ref 70–99)
Potassium: 4.1 mmol/L (ref 3.5–5.1)
Sodium: 134 mmol/L — ABNORMAL LOW (ref 135–145)

## 2018-06-13 MED ORDER — CHLORDIAZEPOXIDE HCL 25 MG PO CAPS
25.0000 mg | ORAL_CAPSULE | Freq: Once | ORAL | Status: AC
  Administered 2018-06-13: 25 mg via ORAL
  Filled 2018-06-13: qty 1

## 2018-06-13 MED ORDER — VITAMIN B-1 100 MG PO TABS
100.0000 mg | ORAL_TABLET | Freq: Every day | ORAL | Status: DC
  Administered 2018-06-13 – 2018-06-16 (×4): 100 mg via ORAL
  Filled 2018-06-13 (×4): qty 1

## 2018-06-13 MED ORDER — HYDROXYZINE HCL 25 MG PO TABS: 25.0000 mg | ORAL_TABLET | Freq: Three times a day (TID) | ORAL | Status: DC | PRN

## 2018-06-13 MED ORDER — TAMSULOSIN HCL 0.4 MG PO CAPS
0.4000 mg | ORAL_CAPSULE | Freq: Every day | ORAL | Status: DC
  Administered 2018-06-13 – 2018-06-16 (×4): 0.4 mg via ORAL
  Filled 2018-06-13 (×4): qty 1

## 2018-06-13 NOTE — Care Management Note (Signed)
Case Management Note  Patient Details  Name: Robby SermonRobert G Dorow MRN: [id number] Date of Birth: 1991/10/06  Subjective/Objective:                  Overdose and resp failure on bipap Hx of etoh abuse Action/Plan: Following for progression and cm needs/csw referral for substance and etoh abuse resources.  Expected Discharge Date:  (unknown)               Expected Discharge Plan:  Home/Self Care  In-House Referral:  Clinical Social Work  Discharge planning Services  CM Consult, [facility], Other - See comment  Post Acute Care Choice:    Choice offered to:     DME Arranged:    DME Agency:     HH Arranged:    HH Agency:     Status of Service:  In process, will continue to follow  If discussed at Long Length of Stay Meetings, dates discussed:    Additional Comments:  Golda AcreDavis, Rhonda Lynn, RN 06/13/2018, 10:02 AM

## 2018-06-13 NOTE — Progress Notes (Addendum)
PROGRESS NOTE    Riley [name]  ZOX:[id number] DOB: 03/09/1991 DOA: 06/11/2018 PCP: Patient, No Pcp Per   Brief Narrative:  HPI on 06/11/2018 by Dr. Therisa Doyne Riley [name] is a 27 y.o. male with medical history significant of alcohol abuse, depression suicide attempts  Presented with   overdose of unknown substance was found unresponsive possibly take a handful of methadone at 4 PM.  Patient stated to nursing staff that he was just trying to get rid of some medications. Apparently his friends Mother was going to flush them down the toylet but he took it instead. Thinks there was about 10 tabs 5 mg each. Currently denies suicidal ideation but still under influence. States only drinks few beers.  Roommates noted that methadone was missing.  Multiple 5 mg tablets unsure how many he took. EMS was called administered Narcan and patient became more responsive denied any chest pain or shortness of breath.  According to medication list he is on methadone 5 mg a day but states that's not prescribed he has not been to a doctor in 1 year.  Patient has had prior admissions for overdose and attempted suicide once with insulin out of time with blood pressure medications  Pt drinks on regular bases. reports hx of withdrawals in the past.  Given that currently patient appears to be intoxicated unable to obtain reliable history.  History of suicide attempts in the past in similar circumstances  Interim history Admitted for methadone overdose in the setting of alcohol intoxication and withdrawal.  PCCM consulted for airway protection. Assessment & Plan   Methadone overdose -Patient denies any intentional harm or suicidal ideations at this time although has had a history of suicidal ideations.  He was given Narcan. -Poison control contacted and requested repeat EKG to monitor for prolonged QT. -Concern for airway protection, as patient does have periods of apnea with PCO2 ranging from 40s to 60s.     -PCCM consulted and appreciated -patient is easily arousable but still has periods of apnea -Continue safety sitter -Psychiatry consulted and appreciated, recommending inpatient psychiatry admission. Discussed with Dr. Sharma Covert, psych, it seems that per patient's friend, he has made comments about suicidal statements in the past 2 weeks.  Alcohol abuse and withdrawal -Alcohol level on admission 242 -Patient currently in cold sweat with signs of withdrawal -Placed on CIWA, current score 12 -Continue multivitamin, thiamine, folic acid -Unable to give Ativan given concern for airway protection and increased somnolence. -We will give additional dose of Librium today -Placed on Benadryl cream as well as hydroxyzine orally as needed for itchiness -Will increase hydroxyzine  Tobacco abuse -Discussed cessation, nicotine patch ordered  Acute urinary retention -It seems that overnight, patient had over 600 cc on bladder scan and required in/out cath -continued to have retention, foley placed -Will order Flomax  DVT Prophylaxis  SCDs  Code Status: Full  Family Communication: None at bedside  Disposition Plan: Admitted. Continue to monitor in stepdown.   Consultants PCCM Psychiatry   Procedures  None  Antibiotics   Anti-infectives (From admission, onward)   None      Subjective:   Riley [name] seen and examined today.  Planes of feeling itchy this morning.  Denies current shortness of breath, chest pain, abdominal pain, nausea vomiting, diarrhea or constipation, headache or dizziness.  Objective:   Vitals:   06/13/18 0800 06/13/18 1000 06/13/18 1100 06/13/18 1200  BP: (!) 149/82 (!) 140/93 (!) 147/84 (!) 146/69  Pulse: (!) 104 (!) 102  82 70  Resp: 12 17 (!) 8 13  Temp: 99.5 F (37.5 C)     TempSrc: Oral     SpO2: 96% 97% 94% (!) 84%  Weight:      Height:        Intake/Output Summary (Last 24 hours) at 06/13/2018 1322 Last data filed at 06/13/2018 1111 Gross per 24  hour  Intake 3145.73 ml  Output 1350 ml  Net 1795.73 ml   Filed Weights   06/12/18 0000  Weight: 71.5 kg   Exam  General: Well developed, well nourished, NAD, appears stated age  HEENT: NCAT, mucous membranes moist.   Neck: Supple  Cardiovascular: S1 S2 auscultated,  No murmurs, RRR  Respiratory: Essential clear, periods of apnea. No wheezing or rhonchi  Abdomen: Soft, nontender, nondistended, + bowel sounds  Extremities: warm dry without cyanosis clubbing or edema  Neuro: AAOx3, nonfocal  Psych: Appropriate mood and affect, pleasant   Data Reviewed: I have personally reviewed following labs and imaging studies  CBC: Recent Labs  Lab 06/11/18 1839 06/12/18 0731 06/13/18 0330  WBC 15.0* 10.9* 9.4  NEUTROABS 12.5*  --   --   HGB 16.0 14.4 14.5  HCT 46.5 43.8 45.5  MCV 96.5 100.2* 101.1*  PLT 248 231 194   Basic Metabolic Panel: Recent Labs  Lab 06/11/18 1839 06/12/18 0731 06/13/18 0330  NA 143 136 134*  K 3.8 4.0 4.1  CL 106 97* 98  CO2 24 27 28   GLUCOSE 96 126* 102*  BUN 11 14 7   CREATININE 1.11 1.05 0.80  CALCIUM 8.3* 8.1* 8.1*   GFR: Estimated Creatinine Clearance: 132 mL/min (by C-G formula based on SCr of 0.8 mg/dL). Liver Function Tests: Recent Labs  Lab 06/11/18 1839 06/12/18 0731  AST 45* 38  ALT 38 33  ALKPHOS 97 88  BILITOT 0.5 0.6  PROT 7.5 7.0  ALBUMIN 4.2 3.8   No results for input(s): LIPASE, AMYLASE in the last 168 hours. No results for input(s): AMMONIA in the last 168 hours. Coagulation Profile: No results for input(s): INR, PROTIME in the last 168 hours. Cardiac Enzymes: No results for input(s): CKTOTAL, CKMB, CKMBINDEX, TROPONINI in the last 168 hours. BNP (last 3 results) No results for input(s): PROBNP in the last 8760 hours. HbA1C: No results for input(s): HGBA1C in the last 72 hours. CBG: No results for input(s): GLUCAP in the last 168 hours. Lipid Profile: No results for input(s): CHOL, HDL, LDLCALC, TRIG,  CHOLHDL, LDLDIRECT in the last 72 hours. Thyroid Function Tests: No results for input(s): TSH, T4TOTAL, FREET4, T3FREE, THYROIDAB in the last 72 hours. Anemia Panel: No results for input(s): VITAMINB12, FOLATE, FERRITIN, TIBC, IRON, RETICCTPCT in the last 72 hours. Urine analysis:    Component Value Date/Time   COLORURINE COLORLESS (A) 12/12/2017 0401   APPEARANCEUR CLEAR 12/12/2017 0401   LABSPEC 1.002 (L) 12/12/2017 0401   PHURINE 6.0 12/12/2017 0401   GLUCOSEU NEGATIVE 12/12/2017 0401   HGBUR NEGATIVE 12/12/2017 0401   BILIRUBINUR NEGATIVE 12/12/2017 0401   KETONESUR NEGATIVE 12/12/2017 0401   PROTEINUR NEGATIVE 12/12/2017 0401   NITRITE NEGATIVE 12/12/2017 0401   LEUKOCYTESUR NEGATIVE 12/12/2017 0401   Sepsis Labs: @LABRCNTIP (procalcitonin:4,lacticidven:4)  ) Recent Results (from the past 240 hour(s))  MRSA PCR Screening     Status: None   Collection Time: 06/12/18 12:04 AM  Result Value Ref Range Status   MRSA by PCR NEGATIVE NEGATIVE Final    Comment:        The GeneXpert MRSA  Assay (FDA approved for NASAL specimens only), is one component of a comprehensive MRSA colonization surveillance program. It is not intended to diagnose MRSA infection nor to guide or monitor treatment for MRSA infections. Performed at [facility], [address], [place], [place] [address]       Radiology Studies: Dg Chest Portable 1 View  Result Date: 06/11/2018 CLINICAL DATA:  Recently found unresponsive from methadone overdose, initial encounter EXAM: PORTABLE CHEST 1 VIEW COMPARISON:  10/22/2017 FINDINGS: The heart size and mediastinal contours are within normal limits. Both lungs are clear. The visualized skeletal structures are unremarkable. IMPRESSION: No active disease. Electronically Signed   By: Alcide Clever M.D.   On: 06/11/2018 19:03     Scheduled Meds: . chlorhexidine  15 mL Mouth Rinse BID  . folic acid  1 mg Oral Daily  . mouth rinse  15 mL Mouth  Rinse BID  . multivitamin with minerals  1 tablet Oral Daily  . nicotine  21 mg Transdermal Daily  . thiamine  100 mg Oral Daily   Continuous Infusions: . sodium chloride 100 mL/hr at 06/13/18 0740  . famotidine (PEPCID) IV Stopped (06/13/18 1111)     LOS: 1 day   Time Spent in minutes   45 minutes  Hondo Nanda D.O. on 06/13/2018 at 1:22 PM  Between 7am to 7pm - Please see pager noted on [email or web address]  After 7pm go to [email or web address]  And look for the night coverage person covering for me after hours  [facility] Office  [phone]

## 2018-06-13 NOTE — Progress Notes (Signed)
Pt. Awake and responsive at this time with saturations of 100% on 4llpm [place].  Decreased to 3 and will monitor for changes.  Pt. Does not require BIPAP at this time, however, will monitor closely especially when sleeping.

## 2018-06-13 NOTE — Progress Notes (Signed)
Riley [name]  ZOX:[id number] DOB: [birth year] DOA: 06/11/2018 PCP: Patient, No Pcp Per    Reason for Consult / Chief Complaint:  Altered mentation Airway protection   HPI/Brief Narrative   27 year old presented to the ED with altered mentation He was found unresponsive by his roommate Admitted to drinking alcohol and using methadone Very high alcohol level at presentation  Assessment & Plan:  1.  Respiratory insufficiency -Oxygen supplementation -Monitoring for sedation  2.  EtOH intoxication - on CIWA protocol  3.  Methadone overdose -Monitor closely  4.  polysubstance abuse   Best practice/Goals of care/disposition.    Diet: Regular, as tolerated Mobility: Bedrest at present Code Status: Full code  Disposition / Summary of Today's Plan 06/13/18    Continue supportive measures Close clinical monitoring Monitor for withdrawal CIWA  Significant Diagnostic Tests: Chest x-ray shows no acute infiltrate  Micro Data: No cultures  Antimicrobials:  Not on any antibiotics  Subjective  Overall feels better More attentive and interactive Falls asleep easily  Objective    Examination: General: Middle-aged gentleman, does not appear to be in distress HENT: Moist oral mucosa Lungs: Clear breath sounds bilaterally Cardiovascular: S1S2 appreciated Abdomen: Sounds appreciated Extremities: No edema Neuro: Awake and alert GU: Good urine output  Blood pressure (!) 150/99, pulse (!) 107, temperature 98.2 F (36.8 C), temperature source Oral, resp. rate 14, height 5\' 7"  (1.702 m), weight 71.5 kg, SpO2 92 %.        Intake/Output Summary (Last 24 hours) at 06/13/2018 1744 Last data filed at 06/13/2018 1400 Gross per 24 hour  Intake 2845.13 ml  Output 1000 ml  Net 1845.13 ml   Filed Weights   06/12/18 0000  Weight: 71.5 kg     Labs   CBC: Recent Labs  Lab 06/11/18 1839 06/12/18 0731 06/13/18 0330  WBC 15.0* 10.9* 9.4  NEUTROABS 12.5*  --   --     HGB 16.0 14.4 14.5  HCT 46.5 43.8 45.5  MCV 96.5 100.2* 101.1*  PLT 248 231 194   Basic Metabolic Panel: Recent Labs  Lab 06/11/18 1839 06/12/18 0731 06/13/18 0330  NA 143 136 134*  K 3.8 4.0 4.1  CL 106 97* 98  CO2 24 27 28   GLUCOSE 96 126* 102*  BUN 11 14 7   CREATININE 1.11 1.05 0.80  CALCIUM 8.3* 8.1* 8.1*   GFR: Estimated Creatinine Clearance: 132 mL/min (by C-G formula based on SCr of 0.8 mg/dL). Recent Labs  Lab 06/11/18 1839 06/12/18 0731 06/13/18 0330  WBC 15.0* 10.9* 9.4   Liver Function Tests: Recent Labs  Lab 06/11/18 1839 06/12/18 0731  AST 45* 38  ALT 38 33  ALKPHOS 97 88  BILITOT 0.5 0.6  PROT 7.5 7.0  ALBUMIN 4.2 3.8   No results for input(s): LIPASE, AMYLASE in the last 168 hours. No results for input(s): AMMONIA in the last 168 hours. ABG No results found for: PHART, PCO2ART, PO2ART, HCO3, TCO2, ACIDBASEDEF, O2SAT  Coagulation Profile: No results for input(s): INR, PROTIME in the last 168 hours. Cardiac Enzymes: No results for input(s): CKTOTAL, CKMB, CKMBINDEX, TROPONINI in the last 168 hours. HbA1C: No results found for: HGBA1C CBG: No results for input(s): GLUCAP in the last 168 hours.   Review of Systems:     Past medical history   Past Medical History:  Diagnosis Date  . Alcoholism [facility](HCC)    Social History   Social History   Socioeconomic History  . Marital status: Significant Other  Spouse name: Not on file  . Number of children: Not on file  . Years of education: Not on file  . Highest education level: Not on file  Occupational History  . Not on file  Social Needs  . Financial resource strain: Not on file  . Food insecurity:    Worry: Not on file    Inability: Not on file  . Transportation needs:    Medical: Not on file    Non-medical: Not on file  Tobacco Use  . Smoking status: Current Every Day Smoker    Packs/day: 2.00    Types: Cigarettes  . Smokeless tobacco: Never Used  Substance and Sexual  Activity  . Alcohol use: Yes    Comment: Pt stated "I drink  1/2 gal of 100 proof every day"  . Drug use: No  . Sexual activity: Not Currently  Lifestyle  . Physical activity:    Days per week: Not on file    Minutes per session: Not on file  . Stress: Not on file  Relationships  . Social connections:    Talks on phone: Not on file    Gets together: Not on file    Attends religious service: Not on file    Active member of club or organization: Not on file    Attends meetings of clubs or organizations: Not on file    Relationship status: Not on file  . Intimate partner violence:    Fear of current or ex partner: Not on file    Emotionally abused: Not on file    Physically abused: Not on file    Forced sexual activity: Not on file  Other Topics Concern  . Not on file  Social History Narrative  . Not on file    Family history    Family History  Problem Relation Age of Onset  . Hypertension Other   . Diabetes Other     Allergies No Known Allergies  Home meds  Prior to Admission medications   Medication Sig Start Date End Date Taking? Authorizing Provider  Aspirin-Salicylamide-Caffeine (BC HEADACHE PO) Take 1 packet by mouth daily as needed (headache).   Yes [provider]  methadone (DOLOPHINE) 5 MG tablet Take 5 mg by mouth once.   Yes [provider]  citalopram (CELEXA) 20 MG tablet Take 1 tablet (20 mg total) by mouth daily. for mood control Patient not taking: Reported on 06/11/2018 12/24/17   Money, Gerlene Burdock, FNP  gabapentin (NEURONTIN) 300 MG capsule Take 1 capsule (300 mg total) by mouth 3 (three) times daily. Patient not taking: Reported on 06/11/2018 12/23/17   Money, Gerlene Burdock, FNP  hydrOXYzine (ATARAX/VISTARIL) 50 MG tablet Take 1 tablet (50 mg total) by mouth 3 (three) times daily as needed for anxiety. Patient not taking: Reported on 06/11/2018 12/23/17   Money, Gerlene Burdock, FNP  QUEtiapine (SEROQUEL) 200 MG tablet Take 1 tablet (200 mg total) by mouth  at bedtime. For mood control Patient not taking: Reported on 06/11/2018 12/23/17   Money, Gerlene Burdock, FNP  QUEtiapine (SEROQUEL) 50 MG tablet Take 1 tablet (50 mg total) by mouth 2 (two) times daily. For mood control Patient not taking: Reported on 06/11/2018 12/23/17   Money, Gerlene Burdock, FNP  traZODone (DESYREL) 50 MG tablet Take 1 tablet (50 mg total) by mouth at bedtime as needed for sleep. Patient not taking: Reported on 06/11/2018 12/23/17   Money, Gerlene Burdock, FNP     LOS: 1 day

## 2018-06-14 DIAGNOSIS — T50904A Poisoning by unspecified drugs, medicaments and biological substances, undetermined, initial encounter: Secondary | ICD-10-CM

## 2018-06-14 LAB — BASIC METABOLIC PANEL
ANION GAP: 7 (ref 5–15)
BUN: 6 mg/dL (ref 6–20)
CO2: 33 mmol/L — AB (ref 22–32)
CREATININE: 0.65 mg/dL (ref 0.61–1.24)
Calcium: 8.7 mg/dL — ABNORMAL LOW (ref 8.9–10.3)
Chloride: 97 mmol/L — ABNORMAL LOW (ref 98–111)
GFR calc Af Amer: >60 mL/min (ref >60)
GFR calc non Af Amer: >60 mL/min (ref >60)
Glucose, Bld: 104 mg/dL — ABNORMAL HIGH (ref 70–99)
POTASSIUM: 3.8 mmol/L (ref 3.5–5.1)
SODIUM: 137 mmol/L (ref 135–145)

## 2018-06-14 LAB — CBC
HEMATOCRIT: 41.6 % (ref 39.0–52.0)
HEMOGLOBIN: 13.9 g/dL (ref 13.0–17.0)
MCH: 32.9 pg (ref 26.0–34.0)
MCHC: 33.4 g/dL (ref 30.0–36.0)
MCV: 98.3 fL (ref 78.0–100.0)
Platelets: 178 10*3/uL (ref 150–400)
RBC: 4.23 MIL/uL (ref 4.22–5.81)
RDW: 12.7 % (ref 11.5–15.5)
WBC: 6.1 10*3/uL (ref 4.0–10.5)

## 2018-06-14 MED ORDER — CHLORDIAZEPOXIDE HCL 25 MG PO CAPS
25.0000 mg | ORAL_CAPSULE | Freq: Once | ORAL | Status: AC
  Administered 2018-06-14: 25 mg via ORAL
  Filled 2018-06-14: qty 1

## 2018-06-14 NOTE — Progress Notes (Signed)
Pt. Does not want or require use of BIPAP at this time.  Machine pulled from room.

## 2018-06-14 NOTE — Progress Notes (Signed)
Riley SermonRobert G Lucien  AVW:[id number] DOB: July 16, 1991 DOA: 06/11/2018 PCP: Patient, No Pcp Per    Reason for Consult / Chief Complaint:  Altered mentation Airway protection   HPI/Brief Narrative   27 year old presented to the ED with altered mentation He was found unresponsive by his roommate Admitted to drinking alcohol and using methadone Very high alcohol level at presentation He continues to improve  Assessment & Plan:  1.  Respiratory insufficiency -Oxygen supplementation-was weaned off -Monitor for sedation  2.  EtOH intoxication  -On CIWA protocol  3.  Methadone overdose -Has remained clinically stable -Respiratory status is improving  4.  polysubstance abuse  Will sign off, see as needed  Best practice/Goals of care/disposition.    Diet: Regular, as tolerated Mobility: Bedrest at present Code Status: Full code  Disposition / Summary of Today's Plan 06/14/18    Continue supportive measures Clinical monitoring  Significant Diagnostic Tests: Chest x-ray shows no acute infiltrate No new testing overnight  Micro Data: No cultures  Antimicrobials:  Not on any antibiotics  Subjective  Feels better More interactive Wondering about when he goes home  Objective    Examination: General: Young gentleman, does not appear to be in distress HENT: Moist oral mucosa Lungs: Clear breath sounds bilaterally Cardiovascular: S2 appreciated Abdomen: Abdomen soft bowel sounds appreciated Extremities: No edema Neuro: Awake and alert GU: Good urine output  Blood pressure 139/78, pulse (!) 117, temperature (!) 97.5 F (36.4 C), temperature source Oral, resp. rate 16, height 5\' 7"  (1.702 m), weight 71.5 kg, SpO2 98 %.        Intake/Output Summary (Last 24 hours) at 06/14/2018 1202 Last data filed at 06/14/2018 0900 Gross per 24 hour  Intake 2873.26 ml  Output 1500 ml  Net 1373.26 ml   Filed Weights   06/12/18 0000  Weight: 71.5 kg     Labs   CBC: Recent  Labs  Lab 06/11/18 1839 06/12/18 0731 06/13/18 0330 06/14/18 0346  WBC 15.0* 10.9* 9.4 6.1  NEUTROABS 12.5*  --   --   --   HGB 16.0 14.4 14.5 13.9  HCT 46.5 43.8 45.5 41.6  MCV 96.5 100.2* 101.1* 98.3  PLT 248 231 194 178   Basic Metabolic Panel: Recent Labs  Lab 06/11/18 1839 06/12/18 0731 06/13/18 0330 06/14/18 0346  NA 143 136 134* 137  K 3.8 4.0 4.1 3.8  CL 106 97* 98 97*  CO2 24 27 28  33*  GLUCOSE 96 126* 102* 104*  BUN 11 14 7 6   CREATININE 1.11 1.05 0.80 0.65  CALCIUM 8.3* 8.1* 8.1* 8.7*   GFR: Estimated Creatinine Clearance: 132 mL/min (by C-G formula based on SCr of 0.65 mg/dL). Recent Labs  Lab 06/11/18 1839 06/12/18 0731 06/13/18 0330 06/14/18 0346  WBC 15.0* 10.9* 9.4 6.1   Liver Function Tests: Recent Labs  Lab 06/11/18 1839 06/12/18 0731  AST 45* 38  ALT 38 33  ALKPHOS 97 88  BILITOT 0.5 0.6  PROT 7.5 7.0  ALBUMIN 4.2 3.8   No results for input(s): LIPASE, AMYLASE in the last 168 hours. No results for input(s): AMMONIA in the last 168 hours. ABG No results found for: PHART, PCO2ART, PO2ART, HCO3, TCO2, ACIDBASEDEF, O2SAT  Coagulation Profile: No results for input(s): INR, PROTIME in the last 168 hours. Cardiac Enzymes: No results for input(s): CKTOTAL, CKMB, CKMBINDEX, TROPONINI in the last 168 hours. HbA1C: No results found for: HGBA1C CBG: No results for input(s): GLUCAP in the last 168 hours.  Review of Systems:     Past medical history   Past Medical History:  Diagnosis Date  . Alcoholism [facility])    Social History   Social History   Socioeconomic History  . Marital status: Significant Other    Spouse name: Not on file  . Number of children: Not on file  . Years of education: Not on file  . Highest education level: Not on file  Occupational History  . Not on file  Social Needs  . Financial resource strain: Not on file  . Food insecurity:    Worry: Not on file    Inability: Not on file  . Transportation needs:      Medical: Not on file    Non-medical: Not on file  Tobacco Use  . Smoking status: Current Every Day Smoker    Packs/day: 2.00    Types: Cigarettes  . Smokeless tobacco: Never Used  Substance and Sexual Activity  . Alcohol use: Yes    Comment: Pt stated "I drink  1/2 gal of 100 proof every day"  . Drug use: No  . Sexual activity: Not Currently  Lifestyle  . Physical activity:    Days per week: Not on file    Minutes per session: Not on file  . Stress: Not on file  Relationships  . Social connections:    Talks on phone: Not on file    Gets together: Not on file    Attends religious service: Not on file    Active member of club or organization: Not on file    Attends meetings of clubs or organizations: Not on file    Relationship status: Not on file  . Intimate partner violence:    Fear of current or ex partner: Not on file    Emotionally abused: Not on file    Physically abused: Not on file    Forced sexual activity: Not on file  Other Topics Concern  . Not on file  Social History Narrative  . Not on file    Family history    Family History  Problem Relation Age of Onset  . Hypertension Other   . Diabetes Other     Allergies No Known Allergies  Home meds  Prior to Admission medications   Medication Sig Start Date End Date Taking? Authorizing Provider  Aspirin-Salicylamide-Caffeine (BC HEADACHE PO) Take 1 packet by mouth daily as needed (headache).   Yes [provider]  methadone (DOLOPHINE) 5 MG tablet Take 5 mg by mouth once.   Yes [provider]  citalopram (CELEXA) 20 MG tablet Take 1 tablet (20 mg total) by mouth daily. for mood control Patient not taking: Reported on 06/11/2018 12/24/17   Money, Gerlene Burdock, FNP  gabapentin (NEURONTIN) 300 MG capsule Take 1 capsule (300 mg total) by mouth 3 (three) times daily. Patient not taking: Reported on 06/11/2018 12/23/17   Money, Gerlene Burdock, FNP  hydrOXYzine (ATARAX/VISTARIL) 50 MG tablet Take 1 tablet (50  mg total) by mouth 3 (three) times daily as needed for anxiety. Patient not taking: Reported on 06/11/2018 12/23/17   Money, Gerlene Burdock, FNP  QUEtiapine (SEROQUEL) 200 MG tablet Take 1 tablet (200 mg total) by mouth at bedtime. For mood control Patient not taking: Reported on 06/11/2018 12/23/17   Money, Gerlene Burdock, FNP  QUEtiapine (SEROQUEL) 50 MG tablet Take 1 tablet (50 mg total) by mouth 2 (two) times daily. For mood control Patient not taking: Reported on 06/11/2018 12/23/17   Money, Feliz Beam  B, FNP  traZODone (DESYREL) 50 MG tablet Take 1 tablet (50 mg total) by mouth at bedtime as needed for sleep. Patient not taking: Reported on 06/11/2018 12/23/17   Money, Gerlene Burdock, FNP     LOS: 2 days

## 2018-06-14 NOTE — Progress Notes (Signed)
PROGRESS NOTE    Riley [name]  ZOX:[id number] DOB: 1991/06/02 DOA: 06/11/2018 PCP: Patient, No Pcp Per   Brief Narrative:  HPI on 06/11/2018 by Dr. Therisa Doyne Riley [name] is a 27 y.o. male with medical history significant of alcohol abuse, depression suicide attempts  Presented with   overdose of unknown substance was found unresponsive possibly take a handful of methadone at 4 PM.  Patient stated to nursing staff that he was just trying to get rid of some medications. Apparently his friends Mother was going to flush them down the toylet but he took it instead. Thinks there was about 10 tabs 5 mg each. Currently denies suicidal ideation but still under influence. States only drinks few beers.  Roommates noted that methadone was missing.  Multiple 5 mg tablets unsure how many he took. EMS was called administered Narcan and patient became more responsive denied any chest pain or shortness of breath.  According to medication list he is on methadone 5 mg a day but states that's not prescribed he has not been to a doctor in 1 year.  Patient has had prior admissions for overdose and attempted suicide once with insulin out of time with blood pressure medications  Pt drinks on regular bases. reports hx of withdrawals in the past.  Given that currently patient appears to be intoxicated unable to obtain reliable history.  History of suicide attempts in the past in similar circumstances  Interim history Admitted for methadone overdose in the setting of alcohol intoxication and withdrawal.  PCCM consulted for airway protection, however has been able to maintain airway. Psych consulted and recommended inpatient admission. Assessment & Plan   Methadone overdose -Patient denies any intentional harm or suicidal ideations at this time although has had a history of suicidal ideations.  He was given Narcan. -Poison control contacted and requested repeat EKG to monitor for prolonged QT. -There was  concern for airway protection, as patient does have periods of apnea with PCO2 ranging from 40s to 60s.   -PCCM consulted and appreciated -patient is easily arousable but still has periods of apnea- has been able to maintain adequate oxygen saturations  -Continue safety sitter -Psychiatry consulted and appreciated, recommending inpatient psychiatry admission. Discussed with Dr. Sharma Covert, psych, it seems that per patient's friend, he has made comments about suicidal statements in the past 2 weeks.  Alcohol abuse and withdrawal -Alcohol level on admission 242 -Patient currently in cold sweat with signs of withdrawal -Placed on CIWA -Continue multivitamin, thiamine, folic acid -Unable to give Ativan given concern for airway protection and increased somnolence. -Will give additional dose of librium today, 25mg  -Placed on Benadryl cream as well as hydroxyzine orally as needed for itchiness -appears to be stable and improving  Tobacco abuse -Discussed cessation, nicotine patch ordered  Acute urinary retention -patient had over 600 cc on bladder scan and required in/out cath -continued to have retention, foley placed and started on flomax   DVT Prophylaxis  SCDs  Code Status: Full  Family Communication: None at bedside  Disposition Plan: Admitted. Continue to monitor in stepdown.   Consultants PCCM Psychiatry   Procedures  None  Antibiotics   Anti-infectives (From admission, onward)   None      Subjective:   Riley [name] seen and examined today.  Feels itchiness has improved but continues to have it. Denies chest pain, shortness of breath, abdominal pain, nausea vomiting, diarrhea constipation, headache or dizziness.   Objective:   Vitals:   06/14/18 0505  06/14/18 0650 06/14/18 0800 06/14/18 0834  BP: (!) 157/85 139/78    Pulse: 89 85 91 (!) 117  Resp: 15 14 12 16   Temp:   98.2 F (36.8 C)   TempSrc:   Oral   SpO2: 97%  93% 98%  Weight:      Height:         Intake/Output Summary (Last 24 hours) at 06/14/2018 1143 Last data filed at 06/14/2018 0900 Gross per 24 hour  Intake 3113.26 ml  Output 1500 ml  Net 1613.26 ml   Filed Weights   06/12/18 0000  Weight: 71.5 kg   Exam  General: Well developed, well nourished, NAD, appears stated age  HEENT: NCAT, mucous membranes moist.   Neck: Supple  Cardiovascular: S1 S2 auscultated, RRR, no murmur  Respiratory: Clear to auscultation bilaterally with equal chest rise  Abdomen: Soft, nontender, nondistended, + bowel sounds  Extremities: warm dry without cyanosis clubbing or edema  Neuro: AAOx3, nonfocal, falls asleep quickly but easily arousable   Psych: pleasant, appropriate mood and affect  Data Reviewed: I have personally reviewed following labs and imaging studies  CBC: Recent Labs  Lab 06/11/18 1839 06/12/18 0731 06/13/18 0330 06/14/18 0346  WBC 15.0* 10.9* 9.4 6.1  NEUTROABS 12.5*  --   --   --   HGB 16.0 14.4 14.5 13.9  HCT 46.5 43.8 45.5 41.6  MCV 96.5 100.2* 101.1* 98.3  PLT 248 231 194 178   Basic Metabolic Panel: Recent Labs  Lab 06/11/18 1839 06/12/18 0731 06/13/18 0330 06/14/18 0346  NA 143 136 134* 137  K 3.8 4.0 4.1 3.8  CL 106 97* 98 97*  CO2 24 27 28  33*  GLUCOSE 96 126* 102* 104*  BUN 11 14 7 6   CREATININE 1.11 1.05 0.80 0.65  CALCIUM 8.3* 8.1* 8.1* 8.7*   GFR: Estimated Creatinine Clearance: 132 mL/min (by C-G formula based on SCr of 0.65 mg/dL). Liver Function Tests: Recent Labs  Lab 06/11/18 1839 06/12/18 0731  AST 45* 38  ALT 38 33  ALKPHOS 97 88  BILITOT 0.5 0.6  PROT 7.5 7.0  ALBUMIN 4.2 3.8   No results for input(s): LIPASE, AMYLASE in the last 168 hours. No results for input(s): AMMONIA in the last 168 hours. Coagulation Profile: No results for input(s): INR, PROTIME in the last 168 hours. Cardiac Enzymes: No results for input(s): CKTOTAL, CKMB, CKMBINDEX, TROPONINI in the last 168 hours. BNP (last 3 results) No results  for input(s): PROBNP in the last 8760 hours. HbA1C: No results for input(s): HGBA1C in the last 72 hours. CBG: No results for input(s): GLUCAP in the last 168 hours. Lipid Profile: No results for input(s): CHOL, HDL, LDLCALC, TRIG, CHOLHDL, LDLDIRECT in the last 72 hours. Thyroid Function Tests: No results for input(s): TSH, T4TOTAL, FREET4, T3FREE, THYROIDAB in the last 72 hours. Anemia Panel: No results for input(s): VITAMINB12, FOLATE, FERRITIN, TIBC, IRON, RETICCTPCT in the last 72 hours. Urine analysis:    Component Value Date/Time   COLORURINE COLORLESS (A) 12/12/2017 0401   APPEARANCEUR CLEAR 12/12/2017 0401   LABSPEC 1.002 (L) 12/12/2017 0401   PHURINE 6.0 12/12/2017 0401   GLUCOSEU NEGATIVE 12/12/2017 0401   HGBUR NEGATIVE 12/12/2017 0401   BILIRUBINUR NEGATIVE 12/12/2017 0401   KETONESUR NEGATIVE 12/12/2017 0401   PROTEINUR NEGATIVE 12/12/2017 0401   NITRITE NEGATIVE 12/12/2017 0401   LEUKOCYTESUR NEGATIVE 12/12/2017 0401   Sepsis Labs: @LABRCNTIP (procalcitonin:4,lacticidven:4)  ) Recent Results (from the past 240 hour(s))  MRSA PCR Screening  Status: None   Collection Time: 06/12/18 12:04 AM  Result Value Ref Range Status   MRSA by PCR NEGATIVE NEGATIVE Final    Comment:        The GeneXpert MRSA Assay (FDA approved for NASAL specimens only), is one component of a comprehensive MRSA colonization surveillance program. It is not intended to diagnose MRSA infection nor to guide or monitor treatment for MRSA infections. Performed at [facility], [address] [place], [place] [id number]       Radiology Studies: No results found.   Scheduled Meds: . chlorhexidine  15 mL Mouth Rinse BID  . folic acid  1 mg Oral Daily  . mouth rinse  15 mL Mouth Rinse BID  . multivitamin with minerals  1 tablet Oral Daily  . nicotine  21 mg Transdermal Daily  . tamsulosin  0.4 mg Oral Daily  . thiamine  100 mg Oral Daily   Continuous  Infusions: . sodium chloride 100 mL/hr at 06/14/18 0726  . famotidine (PEPCID) IV Stopped (06/14/18 1055)     LOS: 2 days   Time Spent in minutes   30 minutes  Shauna Bodkins D.O. on 06/14/2018 at 11:43 AM  Between 7am to 7pm - Please see pager noted on [email or web address]  After 7pm go to [email or web address]  And look for the night coverage person covering for me after hours  [facility] Office  [phone]

## 2018-06-15 NOTE — Progress Notes (Signed)
PROGRESS NOTE    Riley [name]  ZOX:[id number] DOB: [birth year] DOA: 06/11/2018 PCP: Patient, No Pcp Per   Brief Narrative:  HPI on 06/11/2018 by Dr. Therisa Doyne CEZAR MISIASZEK is a 27 y.o. male with medical history significant of alcohol abuse, depression suicide attempts  Presented with   overdose of unknown substance was found unresponsive possibly take a handful of methadone at 4 PM.  Patient stated to nursing staff that he was just trying to get rid of some medications. Apparently his friends Mother was going to flush them down the toylet but he took it instead. Thinks there was about 10 tabs 5 mg each. Currently denies suicidal ideation but still under influence. States only drinks few beers.  Roommates noted that methadone was missing.  Multiple 5 mg tablets unsure how many he took. EMS was called administered Narcan and patient became more responsive denied any chest pain or shortness of breath.  According to medication list he is on methadone 5 mg a day but states that's not prescribed he has not been to a doctor in 1 year.  Patient has had prior admissions for overdose and attempted suicide once with insulin out of time with blood pressure medications  Pt drinks on regular bases. reports hx of withdrawals in the past.  Given that currently patient appears to be intoxicated unable to obtain reliable history.  History of suicide attempts in the past in similar circumstances  Interim history Admitted for methadone overdose in the setting of alcohol intoxication and withdrawal.  PCCM consulted for airway protection, however has been able to maintain airway. Psych consulted and recommended inpatient admission. Assessment & Plan   Methadone overdose -Patient denies any intentional harm or suicidal ideations at this time although has had a history of suicidal ideations.  He was given Narcan. -Poison control contacted and requested repeat EKG to monitor for prolonged QT. -There was  concern for airway protection, as patient does have periods of apnea with PCO2 ranging from 40s to 60s.   -PCCM consulted and appreciated -patient is easily arousable but still has periods of apnea- has been able to maintain adequate oxygen saturations  -Continue safety sitter -Psychiatry consulted and appreciated, recommending inpatient psychiatry admission. Discussed with Dr. Sharma Covert, psych, it seems that per patient's friend, he has made comments about suicidal statements in the past 2 weeks. -Respiratory status and mental status improved  Alcohol abuse and withdrawal -Alcohol level on admission 242 -Patient currently in cold sweat with signs of withdrawal -Placed on CIWA -Continue multivitamin, thiamine, folic acid -Unable to give Ativan given concern for airway protection and increased somnolence. -Placed on Benadryl cream as well as hydroxyzine orally as needed for itchiness- improved  -Currently stable, appears to be improving   Tobacco abuse -Discussed cessation, nicotine patch ordered  Acute urinary retention -patient had over 600 cc on bladder scan and required in/out cath -continued to have retention, foley placed and started on flomax  -will have foley catheter removed and attempt voiding trial  DVT Prophylaxis  SCDs  Code Status: Full  Family Communication: None at bedside  Disposition Plan: Admitted. Will transfer to medical floor. Pending inpatient psych admission when medically stable.   Consultants PCCM Psychiatry   Procedures  None  Antibiotics   Anti-infectives (From admission, onward)   None      Subjective:   Riley [name] seen and examined today.  Feels itchiness has improved. Denies current shortness of breath, chest pain, abdominal pain, nausea vomiting, diarrhea constipation,  dizziness or headache.  Feels he needs to go home and return back to work.  Has no suicidal or homicidal ideations.  Objective:   Vitals:   06/14/18 2000 06/15/18 0000  06/15/18 0400 06/15/18 0754  BP: 133/87 (!) 130/100    Pulse: 89 85 95   Resp: 13 (!) 8    Temp: 98 F (36.7 C)   98.5 F (36.9 C)  TempSrc: Oral   Oral  SpO2: 94% 94%    Weight:      Height:        Intake/Output Summary (Last 24 hours) at 06/15/2018 1123 Last data filed at 06/15/2018 [id number] Gross per 24 hour  Intake 2819.2 ml  Output -  Net 2819.2 ml   Filed Weights   06/12/18 0000  Weight: 71.5 kg   Exam  General: Well developed, well nourished, NAD, appears stated age  HEENT: NCAT, mucous membranes moist.   Neck: Supple  Cardiovascular: S1 S2 auscultated, RRR, no murmur  Respiratory: Clear to auscultation bilaterally with equal chest rise  Abdomen: Soft, nontender, nondistended, + bowel sounds  Extremities: warm dry without cyanosis clubbing or edema  Neuro: AAOx3, nonfocal  Psych: Appropriate mood and affect, pleasant   Data Reviewed: I have personally reviewed following labs and imaging studies  CBC: Recent Labs  Lab 06/11/18 1839 06/12/18 0731 06/13/18 0330 06/14/18 0346  WBC 15.0* 10.9* 9.4 6.1  NEUTROABS 12.5*  --   --   --   HGB 16.0 14.4 14.5 13.9  HCT 46.5 43.8 45.5 41.6  MCV 96.5 100.2* 101.1* 98.3  PLT 248 231 194 178   Basic Metabolic Panel: Recent Labs  Lab 06/11/18 1839 06/12/18 0731 06/13/18 0330 06/14/18 0346  NA 143 136 134* 137  K 3.8 4.0 4.1 3.8  CL 106 97* 98 97*  CO2 24 27 28  33*  GLUCOSE 96 126* 102* 104*  BUN 11 14 7 6   CREATININE 1.11 1.05 0.80 0.65  CALCIUM 8.3* 8.1* 8.1* 8.7*   GFR: Estimated Creatinine Clearance: 132 mL/min (by C-G formula based on SCr of 0.65 mg/dL). Liver Function Tests: Recent Labs  Lab 06/11/18 1839 06/12/18 0731  AST 45* 38  ALT 38 33  ALKPHOS 97 88  BILITOT 0.5 0.6  PROT 7.5 7.0  ALBUMIN 4.2 3.8   No results for input(s): LIPASE, AMYLASE in the last 168 hours. No results for input(s): AMMONIA in the last 168 hours. Coagulation Profile: No results for input(s): INR, PROTIME in  the last 168 hours. Cardiac Enzymes: No results for input(s): CKTOTAL, CKMB, CKMBINDEX, TROPONINI in the last 168 hours. BNP (last 3 results) No results for input(s): PROBNP in the last 8760 hours. HbA1C: No results for input(s): HGBA1C in the last 72 hours. CBG: No results for input(s): GLUCAP in the last 168 hours. Lipid Profile: No results for input(s): CHOL, HDL, LDLCALC, TRIG, CHOLHDL, LDLDIRECT in the last 72 hours. Thyroid Function Tests: No results for input(s): TSH, T4TOTAL, FREET4, T3FREE, THYROIDAB in the last 72 hours. Anemia Panel: No results for input(s): VITAMINB12, FOLATE, FERRITIN, TIBC, IRON, RETICCTPCT in the last 72 hours. Urine analysis:    Component Value Date/Time   COLORURINE COLORLESS (A) 12/12/2017 0401   APPEARANCEUR CLEAR 12/12/2017 0401   LABSPEC 1.002 (L) 12/12/2017 0401   PHURINE 6.0 12/12/2017 0401   GLUCOSEU NEGATIVE 12/12/2017 0401   HGBUR NEGATIVE 12/12/2017 0401   BILIRUBINUR NEGATIVE 12/12/2017 0401   KETONESUR NEGATIVE 12/12/2017 0401   PROTEINUR NEGATIVE 12/12/2017 0401   NITRITE NEGATIVE  12/12/2017 0401   LEUKOCYTESUR NEGATIVE 12/12/2017 0401   Sepsis Labs: @LABRCNTIP (procalcitonin:4,lacticidven:4)  ) Recent Results (from the past 240 hour(s))  MRSA PCR Screening     Status: None   Collection Time: 06/12/18 12:04 AM  Result Value Ref Range Status   MRSA by PCR NEGATIVE NEGATIVE Final    Comment:        The GeneXpert MRSA Assay (FDA approved for NASAL specimens only), is one component of a comprehensive MRSA colonization surveillance program. It is not intended to diagnose MRSA infection nor to guide or monitor treatment for MRSA infections. Performed at [facility], [address], [place], [place] [address]       Radiology Studies: No results found.   Scheduled Meds: . chlorhexidine  15 mL Mouth Rinse BID  . folic acid  1 mg Oral Daily  . mouth rinse  15 mL Mouth Rinse BID  . multivitamin with  minerals  1 tablet Oral Daily  . nicotine  21 mg Transdermal Daily  . tamsulosin  0.4 mg Oral Daily  . thiamine  100 mg Oral Daily   Continuous Infusions: . sodium chloride 100 mL/hr at 06/15/18 0600  . famotidine (PEPCID) IV 20 mg (06/15/18 0930)     LOS: 3 days   Time Spent in minutes   30 minutes  Gjon Letarte D.O. on 06/15/2018 at 11:23 AM  Between 7am to 7pm - Please see pager noted on [email or web address]  After 7pm go to [email or web address]  And look for the night coverage person covering for me after hours  [facility] Office  [phone]

## 2018-06-16 MED ORDER — FAMOTIDINE 20 MG PO TABS: 20.0000 mg | ORAL_TABLET | Freq: Two times a day (BID) | ORAL | Status: DC

## 2018-06-16 MED ORDER — THIAMINE HCL 100 MG PO TABS: 100.0000 mg | ORAL_TABLET | Freq: Every day | ORAL | 0 refills | Status: DC

## 2018-06-16 MED ORDER — ADULT MULTIVITAMIN W/MINERALS CH: 1.0000 | ORAL_TABLET | Freq: Every day | ORAL | 0 refills | Status: DC

## 2018-06-16 MED ORDER — NICOTINE 21 MG/24HR TD PT24: 21.0000 mg | MEDICATED_PATCH | Freq: Every day | TRANSDERMAL | 0 refills | Status: DC

## 2018-06-16 NOTE — Progress Notes (Signed)
Pharmacy IV to PO conversion  The patient is receiving Pepcid by the intravenous route.  Based on criteria approved by the Pharmacy and Therapeutics Committee and the Medical Executive Committee, the medication is being converted to the equivalent oral dose form.   No active GI bleeding or impaired absorption  Not s/p esophagectomy  Documented ability to take oral medications for > 24 hr  Plan to continue treatment for at least 1 day  If you have any questions about this conversion, please contact the Pharmacy Department (ext [phone]).  Thank you.  Bernadene Personrew Amarilis Belflower, PharmD, BCPS [phone] 06/16/2018, 10:46 AM

## 2018-06-16 NOTE — Progress Notes (Signed)
Patient given discharge instructions, and verbalized an understanding of all discharge instructions.  Patient agrees with discharge plan, and is being discharged in stable medical condition.  Patient given bus pass to get home.

## 2018-06-16 NOTE — Discharge Instructions (Signed)
Accidental Overdose °An accidental overdose happens when a person accidentally takes too much of a substance, such as a prescription medicine, an illegal drug, or an over-the-counter medicine. The effects of an overdose can be mild, dangerous, or even deadly. °What are the causes? °This condition is caused by taking too much of a medicine or other substance. It often results from: °· Lack of knowledge about a substance. °· Using more than one substance at the same time. °· An error made by the health care provider who prescribed the substance. °· An error made by the pharmacist who fills the prescription order. °· A lapse in memory, such as forgetting that you have already taken a dose of medicine. °· Suddenly using a substance after a long period of not using it. ° °Substances that can cause an accidental overdose include: °· Alcohol. °· Medicines that treat mental problems (psychotropic medicines). °· Pain medicines. °· Cocaine. °· Heroin. °· Multivitamins that contain iron. ° °What increases the risk? °This condition is more likely to occur in: °· Children. Children are at increased risk for an overdose even if they are given only a small amount of a substance because of their small size. Children may also be attracted to colorful pills. °· Elderly adults. Elderly adults are more likely to overdose because they may be taking many different medicines. They may also have difficulty reading labels or remembering when they last took their medicine. °· People who use illegal drugs. °· People who drink alcohol while using drugs or certain medicines. °· People with certain mental health conditions. ° °What are the signs or symptoms? °Symptoms of this condition depend on the substance and the amount that was taken. Common symptoms include: °· Behavior changes, such as confusion. °· Sleepiness. °· Weakness. °· Slowed breathing. °· Nausea and vomiting. °· Seizures. °· Very large or small eye pupil size. ° °An overdose can  cause a very serious condition in which your blood pressure drops to a low level (shock). Symptoms of shock include: °· Cold and clammy skin. °· Pale skin. °· Blue lips. °· Very slow breathing. °· Extreme sleepiness. °· Severe confusion. ° °How is this diagnosed? °This condition is diagnosed based on your symptoms and a physical exam. You will be asked to tell your health care provider which substances you took and when you took them. During the physical exam your health care provider may check and monitor your heart rate and rhythm, your temperature, and your blood pressure (vital signs). He or she may also check your breathing and oxygen levels. Sometimes tests are also done to diagnose the condition. Tests may include: °· Urine tests. These are done to check for substances in your system. °· Blood tests. These may be done to check for: °? Substances in your system. °? Signs of an imbalance in your blood minerals (electrolytes). °? Liver damage. °? Kidney damage. °? An abnormal acid level in your blood. °· An electrocardiogram (ECG). This tests is done to monitor electrical activity in your heart. ° °How is this treated? °This condition may need to be treated right away at the hospital. The first step in treatment is supporting your vital signs and your breathing. After that treatment may involve: °· Getting fluids and electrolytes through an IV tube. °· Having a breathing tube inserted in your airway to help you breathe. The breathing tube is called a endotracheal tube. °· Getting medicines. These may include: °? Medicines that absorb any substance that is in your digestive   system. °? Medicines that block or reverse the effect of the substance that caused the overdose. °· Having your blood filtered through an artificial kidney machine (hemodialysis). °· Ongoing counseling and mental health support. This may be provided if you used an illegal drug. ° °Follow these instructions at home: °Medicines °· Take  over-the-counter and prescription medicines only as told by your health care provider. °· Before taking a new medicine, ask your health care provider whether the medicine may cause side effects and whether the medicine might react with other medicines. °· Keep a list of all of the medicines that you take, including over-the-counter medicines. Bring this list with you to all of your medical visits. °General instructions °· Drink enough fluid to keep your urine clear or pale yellow. °· If you are working with a counselor or mental health professional, make sure to follow his or her instructions. °· Keep all follow-up visits as told by your health care provider. This is important. °· Limit alcohol intake to no more than 1 drink a day for nonpregnant women and 2 drinks a day for men. One drink equals 12 oz of beer, 5 oz of wine, or 1½ oz of hard liquor. °How is this prevented? °· Get help if you are struggling with: °? Alcohol or drug use. °? Depression or another mental health problem. °· Keep the phone number of your local poison control center near your phone or on your cell phone. The hotline of the National Poison Control Center is (800) 222-1222. °· Store all medicines in safety containers that are out of the reach of children. °· Read the drug inserts that come with your medicines. °· Create a system for taking your medicine, such as with a pill box, that will help you avoid taking too much. °· Do not drink alcohol while taking medicines unless your health care provider approves. °· Do not use illegal drugs. °· Do not take medicines that are not prescribed for you. °· If you are breastfeeding, talk to your health care provider before taking medicines or other substances. Certain drugs and medicines can be passed through breast milk to your baby. °Contact a health care provider if: °· Your symptoms return. °· You develop new symptoms or side effects after taking a medicine. °· You have questions about a possible  overdose. Call your local poison control center at 1-800-222-1222. °Get help right away if: °· You think that you or someone else may have taken too much of a substance. °· You or someone else is having symptoms of an overdose. °· You have serious thoughts about hurting yourself or others. °· You become confused. °· You have chest pain. °· You have trouble breathing. °· You lose consciousness. °· You have a seizure. °· You have trouble staying awake. °These symptoms may represent a serious problem that is an emergency. Do not wait to see if the symptoms will go away. Get medical help right away. Call your local emergency services (911 in the [place] Do not drive yourself to the hospital. °Summary °· An accidental overdose happens when a person accidentally takes too much of a medicine or other substance, such as a prescription medicine, an illegal drug, or an over-the-counter medicine. °· The effects of an overdose can be mild, dangerous, or even deadly. °· This condition is diagnosed based on your symptoms and a physical exam. You will be asked to tell your health care provider which substances you took and when you took them. °· This   condition may need to be treated right away at the hospital. °This information is not intended to replace advice given to you by your health care provider. Make sure you discuss any questions you have with your health care provider. °Document Released: 12/22/2004 Document Revised: 09/20/2016 Document Reviewed: 09/20/2016 °[employer] Interactive Patient Education © 2017 [employer] ° °

## 2018-06-16 NOTE — Discharge Summary (Addendum)
Physician Discharge Summary  Riley [name] ZOX:[id number] DOB: 06/18/1991 DOA: 06/11/2018  PCP: Patient, No Pcp Per  Admit date: 06/11/2018 Discharge date: 06/16/2018  Time spent: 45 minutes  Recommendations for Outpatient Follow-up:  Patient will be discharged to home.  Patient will need to follow up with primary care provider within one week of discharge.  Patient should continue medications as prescribed.  Patient should follow a regular diet.   Discharge Diagnoses:  Methadone overdose Alcohol abuse and withdrawal Tobacco abuse Acute urinary retention  Discharge Condition: Stable  Diet recommendation: Regular  Filed Weights   06/12/18 0000  Weight: 71.5 kg    History of present illness:  on 06/11/2018 by Dr. Charmian Muff [name] a 26 y.o.malewith medical history significant of alcohol abuse, depression suicide attempts  Presented withoverdose of unknown substance was found unresponsive possibly take a handful of methadone at 4 PM.Patient stated to nursing staff that he was just trying to get rid of some medications. Apparently his friends Mother was going to flush them down the toylet but he took it instead. Thinks there was about 10 tabs 5 mg each. Currently denies suicidal ideation but still under influence. States only drinks few beers.Roommates noted that methadone was missing. Multiple 5 mg tablets unsure how many he took. EMS was called administered Narcan and patient became more responsive denied any chest pain or shortness of breath.According to medication list he is on methadone 5 mg a day but states that's not prescribed he has not been to a doctor in 1 year. Patient has had prior admissions for overdose and attemptedsuicide once with insulin out of time with blood pressure medications Pt drinks on regular bases.reports hx of withdrawals in the past. Given that currently patient appears to be intoxicated unable to obtain reliable  history.History of suicide attempts in the past in similar circumstances   Hospital Course:  Methadone overdose -Patient denies any intentional harm or suicidal ideations at this time although has had a history of suicidal ideations.  He was given Narcan. -Poison control contacted and requested repeat EKG to monitor for prolonged QT. -There was concern for airway protection, as patient does have periods of apnea with PCO2 ranging from 40s to 60s.   -PCCM consulted and appreciated -patient is easily arousable but still has periods of apnea- has been able to maintain adequate oxygen saturations  -Continue safety sitter -Psychiatry consulted and appreciated, recommending inpatient psychiatry admission. On 06/13/2018, Discussed with Dr. Sharma [name], psych, it seems that per patient's friend, he has made comments about suicidal statements in the past 2 weeks. -Respiratory status and mental status improved -Discussed with psychiatry, Dr. Sharma [name], today 06/16/2018, feels patient is no longer suicidal or harm to himself.  Patient has been cleared from a psychiatry standpoint. -Social work consulted to provide patient with resources.  Alcohol abuse and withdrawal -Alcohol level on admission 242 -Was placed on CIWA -Continue multivitamin, thiamine, folic acid -Unable to give Ativan given concern for airway protection and increased somnolence. -was placed on Benadryl cream as well as hydroxyzine orally as needed for itchiness, which has improved and resolved. -Currently stable.  It does not appear to be withdrawal symptoms.  Tobacco abuse -Discussed cessation, nicotine patch ordered  Acute urinary retention -resolved  Consultants PCCM Psychiatry   Procedures  None  Discharge Exam: Vitals:   06/15/18 1923 06/16/18 0324  BP: (!) 142/93 (!) 128/92  Pulse: 95 81  Resp: 18 18  Temp: 98.1 F (36.7 C) 98.6 F (37 C)  SpO2: 94% 96%   Feeling very well this morning.  Denies current chest  pain, shortness of breath, abdominal pain, nausea vomiting, diarrhea or constipation.  Feels he is ready to go home.  Denies any current suicidal or homicidal ideations.   General: Well developed, well nourished, NAD, appears stated age  HEENT: NCAT, mucous membranes moist.  Neck: Supple  Cardiovascular: S1 S2 auscultated, RRR, no murmurs  Respiratory: Clear to auscultation bilaterally with equal chest rise  Abdomen: Soft, nontender, nondistended, + bowel sounds  Extremities: warm dry without cyanosis clubbing or edema  Neuro: AAOx3, nonfocal  Psych: Normal affect and demeanor with intact judgement and insight, pleasant  Discharge Instructions Discharge Instructions    Discharge instructions   Complete by:  As directed    Patient will be discharged to home.  Patient will need to follow up with primary care provider within one week of discharge.  Patient should continue medications as prescribed.  Patient should follow a regular diet.     Allergies as of 06/16/2018   No Known Allergies     Medication List    STOP taking these medications   citalopram 20 MG tablet Commonly known as:  CELEXA   gabapentin 300 MG capsule Commonly known as:  NEURONTIN   hydrOXYzine 50 MG tablet Commonly known as:  ATARAX/VISTARIL   methadone 5 MG tablet Commonly known as:  DOLOPHINE   QUEtiapine 200 MG tablet Commonly known as:  SEROQUEL   QUEtiapine 50 MG tablet Commonly known as:  SEROQUEL   traZODone 50 MG tablet Commonly known as:  DESYREL     TAKE these medications   BC HEADACHE PO Take 1 packet by mouth daily as needed (headache).   multivitamin with minerals Tabs tablet Take 1 tablet by mouth daily. Start taking on:  06/17/2018   nicotine 21 mg/24hr patch Commonly known as:  NICODERM CQ - dosed in mg/24 hours Place 1 patch (21 mg total) onto the skin daily. Start taking on:  06/17/2018   thiamine 100 MG tablet Take 1 tablet (100 mg total) by mouth daily. Start  taking on:  06/17/2018      No Known Allergies    The results of significant diagnostics from this hospitalization (including imaging, microbiology, ancillary and laboratory) are listed below for reference.    Significant Diagnostic Studies: Dg Chest Portable 1 View  Result Date: 06/11/2018 CLINICAL DATA:  Recently found unresponsive from methadone overdose, initial encounter EXAM: PORTABLE CHEST 1 VIEW COMPARISON:  10/22/2017 FINDINGS: The heart size and mediastinal contours are within normal limits. Both lungs are clear. The visualized skeletal structures are unremarkable. IMPRESSION: No active disease. Electronically Signed   By: Alcide Clever M.D.   On: 06/11/2018 19:03    Microbiology: Recent Results (from the past 240 hour(s))  MRSA PCR Screening     Status: None   Collection Time: 06/12/18 12:04 AM  Result Value Ref Range Status   MRSA by PCR NEGATIVE NEGATIVE Final    Comment:        The GeneXpert MRSA Assay (FDA approved for NASAL specimens only), is one component of a comprehensive MRSA colonization surveillance program. It is not intended to diagnose MRSA infection nor to guide or monitor treatment for MRSA infections. Performed at [facility], [address], [place], [place] [address]      Labs: Basic Metabolic Panel: Recent Labs  Lab 06/11/18 1839 06/12/18 0731 06/13/18 0330 06/14/18 0346  NA 143 136 134* 137  K 3.8  4.0 4.1 3.8  CL 106 97* 98 97*  CO2 24 27 28  33*  GLUCOSE 96 126* 102* 104*  BUN 11 14 7 6   CREATININE 1.11 1.05 0.80 0.65  CALCIUM 8.3* 8.1* 8.1* 8.7*   Liver Function Tests: Recent Labs  Lab 06/11/18 1839 06/12/18 0731  AST 45* 38  ALT 38 33  ALKPHOS 97 88  BILITOT 0.5 0.6  PROT 7.5 7.0  ALBUMIN 4.2 3.8   No results for input(s): LIPASE, AMYLASE in the last 168 hours. No results for input(s): AMMONIA in the last 168 hours. CBC: Recent Labs  Lab 06/11/18 1839 06/12/18 0731 06/13/18 0330 06/14/18 0346   WBC 15.0* 10.9* 9.4 6.1  NEUTROABS 12.5*  --   --   --   HGB 16.0 14.4 14.5 13.9  HCT 46.5 43.8 45.5 41.6  MCV 96.5 100.2* 101.1* 98.3  PLT 248 231 194 178   Cardiac Enzymes: No results for input(s): CKTOTAL, CKMB, CKMBINDEX, TROPONINI in the last 168 hours. BNP: BNP (last 3 results) No results for input(s): BNP in the last 8760 hours.  ProBNP (last 3 results) No results for input(s): PROBNP in the last 8760 hours.  CBG: No results for input(s): GLUCAP in the last 168 hours.     Signed:  Edsel PetrinMaryann Ewing Fandino  [facility] 06/16/2018, 11:51 AM   Addendum: CDI Query  Acute hypoxic respiratory failure -secondary to overdose -was placed on supplemental oxygen and weaned off

## 2018-06-16 NOTE — Care Management Note (Signed)
Case Management Note  Patient Details  Name: Riley SermonRobert G [name] MRN: [id number] Date of Birth: Oct 27, 1990  Subjective/Objective:                  Discharged to home: [facility] the am for appointment  Information given to patient who prefers to call.  Action/Plan: No other cm needs present.  Expected Discharge Date:  06/16/18               Expected Discharge Plan:  Home/Self Care  In-House Referral:  Clinical Social Work  Discharge planning Services  [facility] Consult, [facility], Other - See comment  Post Acute Care Choice:  NA Choice offered to:  NA  DME Arranged:    DME Agency:     HH Arranged:    HH Agency:     Status of Service:  Completed, signed off  If discussed at [employer] Meetings, dates discussed:    Additional Comments:  Golda AcreDavis, Rhonda Lynn, RN 06/16/2018, 12:09 PM

## 2018-06-16 NOTE — Clinical Social Work Note (Signed)
Clinical Social Work Assessment  Patient Details  Name: Riley [name] MRN: [id number] Date of Birth: [birth year]  Date of referral:  06/16/18               Reason for consult:  Substance Use/ETOH Abuse, Housing Concerns/Homelessness                Permission sought to share information with:    Permission granted to share information::  No  Name::        Agency::     Relationship::     Contact Information:     Housing/Transportation Living arrangements for the past 2 months:  No permanent address, Homeless Source of Information:  Patient Patient Interpreter Needed:  None Criminal Activity/Legal Involvement Pertinent to Current Situation/Hospitalization:  No - Comment as needed Significant Relationships:  Friend, Significant Other Lives with:  Self Do you feel safe going back to the place where you live?  Yes Need for family participation in patient care:  No (Coment)  Care giving concerns:  Patient reported no care giving concerns. Patient currently homeless and working under the table jobs. Patient admitted for drug overdose and was psychiatrically cleared on 8/26. CSW consulted for housing.    Social Worker assessment / plan: CSW spoke with patient at bedside regarding housing, substance use and local mental health resources. Patient reported that he was recently released from prison few weeks ago after serving 5 months. Patient reported that he moved in with a friend and friend's mother and that his drinking "messed up" his housing situation. Patient reported that he has been drinking since 2016-03-02 after his mother passed away. CSW and patient discussed patient's coping skills and how patient was utilizing drinking as coping tool to deal with the loss of his mother. CSW and patient discussed local mental health resources for patient to utilize as a healthy coping tool. CSW and patient discussed substance abuse treatment resources to address patient's drinking. CSW provided patient with  housing resources to assist patient in the search for housing.  CSW provided patient with appropriate resources. CSW signing off.    Employment status:    Insurance information:  Self Pay (Medicaid Pending) PT Recommendations:  Not assessed at this time Information / Referral to community resources:  Shelter, Outpatient Substance Abuse Treatment Options, Residential Substance Abuse Treatment Options, Other (Comment Required)(local mental health resources)  Patient/Family's Response to care:  Patient appreciative of CSW visit and resources provided.   Patient/Family's Understanding of and Emotional Response to Diagnosis, Current Treatment, and Prognosis:  Patient presented calm and was engaged throughout assessment. Patient was transparent and informed CSW about issues from childhood and losing his mother, CSW validated patient's feelings and provided emotional support. CSW provided patient with resources for housing, substance abuse treatment and local mental health resources. Patient accepted resources. Patient verbalized that he was hopeful to find employment because it occupies his time and prevents him from drinking.   Emotional Assessment Appearance:  Appears stated age Attitude/Demeanor/Rapport:  Engaged Affect (typically observed):  Calm Orientation:  Oriented to Self, Oriented to Situation, Oriented to Place, Oriented to  Time Alcohol / Substance use:  Alcohol Use Psych involvement (Current and /or in the community):  Yes (Comment)(Patient was seen by psychiatrist on 8/26 and psychiatrically cleared)  Discharge Needs  Concerns to be addressed:  Homelessness, Substance Abuse Concerns Readmission within the last 30 days:  No Current discharge risk:  Homeless Barriers to Discharge:  No Barriers Identified   Antionette Poles,  LCSW 06/16/2018, 1:36 PM

## 2018-06-16 NOTE — Consult Note (Signed)
[facility] Psych Consult Progress Note  06/16/2018 10:36 AM Riley [name]  MRN:  [id number] Subjective:   Riley [name] was last seen on 8/22 and recommended for inpatient psychiatric hospitalization due to concern for suicide attempt. Psychiatry was reconsulted due to patient appearing better and suspicion that Methadone overdose was not a suicide attempt. Patient also reports to primary team that he and his friend/roommate have both made statements about not wanting to live in the setting of stressors but he denies intent.   On interview, Riley [name] reports that he feels good.  He reports that he spoke to his friend.  He reports that they commonly have conversations about life during stressful periods.  He reports that they are supportive towards one another.  He denies SI or any intention to harm himself.  He reports, "I am happy."  He denies HI or AVH.  He denies problems with sleep or appetite.  He was educated about the concurrent use of alcohol and opiates and risk for respiratory depression and death.  He is willing to follow up with outpatient substance abuse treatment.  He is able to return to his friend's home.  Principal Problem: Drug overdose Diagnosis:   Patient Active Problem List   Diagnosis Date Noted  . Drug overdose [T50.901A]   . Methadone overdose (HCC) [T40.3X1A] 06/11/2018  . Alcohol abuse with alcohol-induced mood disorder (HCC) [F10.14] 12/30/2017  . Severe recurrent major depression without psychotic features (HCC) [F33.2] 12/21/2017  . MDD (major depressive disorder), severe (HCC) [F32.2] 12/13/2017  . Alcoholism (HCC) [F10.20] 12/12/2017  . Hypokalemia [E87.6] 12/12/2017  . Suicidal ideations [R45.851] 12/12/2017  . Hypoglycemia due to insulin [E16.0, T38.3X5A] 12/12/2017  . Alcohol abuse with intoxication (HCC) [F10.129] 12/12/2017  . Hypoglycemia [E16.2] 12/12/2017  . Insulin overdose [T38.3X1A]    Total Time spent with patient: 15 minutes  Past Psychiatric History:  Alcohol use disorder  Past Medical History:  Past Medical History:  Diagnosis Date  . Alcoholism (HCC)    History reviewed. No pertinent surgical history. Family History:  Family History  Problem Relation Age of Onset  . Hypertension Other   . Diabetes Other    Family Psychiatric  History: Mother-BPAD and GAD and father-BPAD.   Social History:  Social History   Substance and Sexual Activity  Alcohol Use Yes   Comment: Pt stated "I drink  1/2 gal of 100 proof every day"     Social History   Substance and Sexual Activity  Drug Use No    Social History   Socioeconomic History  . Marital status: Significant Other    Spouse name: Not on file  . Number of children: Not on file  . Years of education: Not on file  . Highest education level: Not on file  Occupational History  . Not on file  Social Needs  . Financial resource strain: Not on file  . Food insecurity:    Worry: Not on file    Inability: Not on file  . Transportation needs:    Medical: Not on file    Non-medical: Not on file  Tobacco Use  . Smoking status: Current Every Day Smoker    Packs/day: 2.00    Types: Cigarettes  . Smokeless tobacco: Never Used  Substance and Sexual Activity  . Alcohol use: Yes    Comment: Pt stated "I drink  1/2 gal of 100 proof every day"  . Drug use: No  . Sexual activity: Not Currently  Lifestyle  . Physical  activity:    Days per week: Not on file    Minutes per session: Not on file  . Stress: Not on file  Relationships  . Social connections:    Talks on phone: Not on file    Gets together: Not on file    Attends religious service: Not on file    Active member of club or organization: Not on file    Attends meetings of clubs or organizations: Not on file    Relationship status: Not on file  Other Topics Concern  . Not on file  Social History Narrative  . Not on file    Sleep: Good  Appetite:  Good  Current Medications: Current Facility-Administered  Medications  Medication Dose Route Frequency Provider Last Rate Last Dose  . chlorhexidine (PERIDEX) 0.12 % solution 15 mL  15 mL Mouth Rinse BID Edsel PetrinMikhail, Maryann, DO   15 mL at 06/16/18 1004  . diphenhydrAMINE-zinc acetate (BENADRYL) 2-0.1 % cream   Topical TID PRN Edsel PetrinMikhail, Maryann, DO   1 application at 06/13/18 0809  . famotidine (PEPCID) IVPB 20 mg premix  20 mg Intravenous Q12H Edsel PetrinMikhail, Maryann, DO 100 mL/hr at 06/15/18 2154 20 mg at 06/15/18 2154  . folic acid (FOLVITE) tablet 1 mg  1 mg Oral Daily Mikhail, BethanyMaryann, DO   1 mg at 06/16/18 1004  . hydrOXYzine (ATARAX/VISTARIL) tablet 25 mg  25 mg Oral TID PRN Edsel PetrinMikhail, Maryann, DO      . MEDLINE mouth rinse  15 mL Mouth Rinse BID Edsel PetrinMikhail, Maryann, DO   15 mL at 06/13/18 1037  . multivitamin with minerals tablet 1 tablet  1 tablet Oral Daily Edsel PetrinMikhail, Maryann, DO   1 tablet at 06/16/18 1004  . naloxone (NARCAN) injection 0.4 mg  0.4 mg Intravenous PRN Edsel PetrinMikhail, Maryann, DO      . nicotine (NICODERM CQ - dosed in mg/24 hours) patch 21 mg  21 mg Transdermal Daily Edsel PetrinMikhail, Maryann, DO   21 mg at 06/16/18 1005  . tamsulosin (FLOMAX) capsule 0.4 mg  0.4 mg Oral Daily Edsel PetrinMikhail, Maryann, DO   0.4 mg at 06/16/18 1004  . thiamine (VITAMIN B-1) tablet 100 mg  100 mg Oral Daily Edsel PetrinMikhail, Maryann, DO   100 mg at 06/16/18 1004  . traZODone (DESYREL) tablet 50 mg  50 mg Oral QHS PRN Edsel PetrinMikhail, Maryann, DO        Lab Results: No results found for this or any previous visit (from the past 48 hour(s)).  Blood Alcohol level:  Lab Results  Component Value Date   ETH 242 (H) 06/11/2018   ETH 102 (H) 12/29/2017     Musculoskeletal: Strength & Muscle Tone: within normal limits Gait & Station: UTA since patient is lying in bed. Patient leans: N/A  Psychiatric Specialty Exam: Physical Exam  Nursing note and vitals reviewed. Constitutional: He is oriented to person, place, and time. He appears well-developed and well-nourished.  HENT:  Head: Normocephalic and  atraumatic.  Neck: Normal range of motion.  Respiratory: Effort normal.  Musculoskeletal: Normal range of motion.  Neurological: He is alert and oriented to person, place, and time.  Skin: No rash noted.  Psychiatric: He has a normal mood and affect. His speech is normal and behavior is normal. Judgment and thought content normal. Cognition and memory are normal.    Review of Systems  Constitutional: Negative for chills and fever.  Cardiovascular: Negative for chest pain.  Gastrointestinal: Negative for abdominal pain, constipation, diarrhea, nausea and vomiting.  Psychiatric/Behavioral: Positive  for substance abuse. Negative for depression, hallucinations and suicidal ideas. The patient is not nervous/anxious and does not have insomnia.   All other systems reviewed and are negative.   Blood pressure (!) 128/92, pulse 81, temperature 98.6 F (37 C), temperature source Oral, resp. rate 18, height 5\' 7"  (1.702 m), weight 71.5 kg, SpO2 96 %.Body mass index is 24.69 kg/m.  General Appearance: Fairly Groomed, young, Caucasian male, wearing a hospital gown and lying in bed. NAD.   Eye Contact:  Good  Speech:  Clear and Coherent and Normal Rate  Volume:  Normal  Mood:  Euthymic  Affect:  Appropriate and Full Range  Thought Process:  Goal Directed and Linear  Orientation:  Full (Time, Place, and Person)  Thought Content:  Logical  Suicidal Thoughts:  No  Homicidal Thoughts:  No  Memory:  Immediate;   Good Recent;   Good Remote;   Good  Judgement:  Fair  Insight:  Fair  Psychomotor Activity:  Normal  Concentration:  Concentration: Good and Attention Span: Good  Recall:  Good  Fund of Knowledge:  Good  Language:  Good  Akathisia:  No  Handed:  Right  AIMS (if indicated):   N/A  Assets:  Communication Skills Desire for Improvement Housing Physical Health Resilience Social Support  ADL's:  Intact  Cognition:  WNL  Sleep:   Okay   Assessment:  Riley [name] is a 27 y.o. male  who was admitted with accidental Methadone overdose. He adamantly continues to deny SI and has not demonstrated any unsafe behaviors in the hospital. He minimizes his substance use but is agreeable to receiving substance abuse treatment. He denies HI or AVH. He does not warrant inpatient psychiatric hospitalization at this time.    Treatment Plan Summary: -Continue Trazodone 50 mg qhs PRN for insomnia.  -Patient is psychiatrically cleared. Please have SW provide patient with outpatient substance abuse resources. -Psychiatry will sign off on patient at this time. Please consult psychiatry again as needed.   Cherly Beach, DO 06/16/2018, 10:36 AM

## 2018-06-24 ENCOUNTER — Encounter (HOSPITAL_COMMUNITY): Payer: Self-pay

## 2018-06-24 ENCOUNTER — Emergency Department (HOSPITAL_COMMUNITY)
Admission: EM | Admit: 2018-06-24 | Discharge: 2018-06-24 | Disposition: A | Discharge status: Home / Self Care | Payer: Self-pay | Attending: Emergency Medicine | Admitting: Emergency Medicine

## 2018-06-24 ENCOUNTER — Other Ambulatory Visit: Payer: Self-pay

## 2018-06-24 DIAGNOSIS — F1910 Other psychoactive substance abuse, uncomplicated: Secondary | ICD-10-CM | POA: Insufficient documentation

## 2018-06-24 DIAGNOSIS — Z79899 Other long term (current) drug therapy: Secondary | ICD-10-CM | POA: Insufficient documentation

## 2018-06-24 DIAGNOSIS — F17210 Nicotine dependence, cigarettes, uncomplicated: Secondary | ICD-10-CM | POA: Insufficient documentation

## 2018-06-24 LAB — RAPID URINE DRUG SCREEN, HOSP PERFORMED
AMPHETAMINES: NOT DETECTED
Barbiturates: NOT DETECTED
Benzodiazepines: POSITIVE — AB
COCAINE: NOT DETECTED
OPIATES: POSITIVE — AB
TETRAHYDROCANNABINOL: POSITIVE — AB

## 2018-06-24 LAB — COMPREHENSIVE METABOLIC PANEL
ALT: 26 U/L (ref 0–44)
ANION GAP: 13 (ref 5–15)
AST: 21 U/L (ref 15–41)
Albumin: 3.9 g/dL (ref 3.5–5.0)
Alkaline Phosphatase: 88 U/L (ref 38–126)
BILIRUBIN TOTAL: 0.4 mg/dL (ref 0.3–1.2)
BUN: 6 mg/dL (ref 6–20)
CO2: 26 mmol/L (ref 22–32)
Calcium: 8.8 mg/dL — ABNORMAL LOW (ref 8.9–10.3)
Chloride: 106 mmol/L (ref 98–111)
Creatinine, Ser: 0.74 mg/dL (ref 0.61–1.24)
Glucose, Bld: 78 mg/dL (ref 70–99)
POTASSIUM: 3.6 mmol/L (ref 3.5–5.1)
Sodium: 145 mmol/L (ref 135–145)
TOTAL PROTEIN: 7 g/dL (ref 6.5–8.1)

## 2018-06-24 LAB — CBC
HEMATOCRIT: 43 % (ref 39.0–52.0)
HEMOGLOBIN: 14.8 g/dL (ref 13.0–17.0)
MCH: 33 pg (ref 26.0–34.0)
MCHC: 34.4 g/dL (ref 30.0–36.0)
MCV: 95.8 fL (ref 78.0–100.0)
Platelets: 450 10*3/uL — ABNORMAL HIGH (ref 150–400)
RBC: 4.49 MIL/uL (ref 4.22–5.81)
RDW: 13.4 % (ref 11.5–15.5)
WBC: 14.4 10*3/uL — AB (ref 4.0–10.5)

## 2018-06-24 LAB — ETHANOL: Alcohol, Ethyl (B): 12 mg/dL — ABNORMAL HIGH (ref <10)

## 2018-06-24 MED ORDER — SODIUM CHLORIDE 0.9 % IV BOLUS
1000.0000 mL | Freq: Once | INTRAVENOUS | Status: AC
  Administered 2018-06-24: 1000 mL via INTRAVENOUS

## 2018-06-24 NOTE — ED Triage Notes (Signed)
EMS reports called out by friends to [employer] for unresponsive, given Narcan on scene with response, Pt admits methadone, Zanax and ETOH ingestion.  BP 132/94 HR 120 RR 24 Sp02 98  18ga LAC

## 2018-06-24 NOTE — ED Provider Notes (Signed)
4:05 PM-checkout from Dr. Rosalia Hammers to evaluate patient after a period of observation for overdosage of methadone.  He reports taking 30 methadone tablets.  Strength unknown.  He states he did this to get high about as a method of suicide.  He denies suicidal ideation.  At this time he is somewhat lethargic but responsive.  He is drinking water and requests to have some ginger ale.  We will continue observation until more sober.  11:01 PM-at this time the patient is wide awake alert, not drowsy or lethargic.  He has eaten several times and drank several glasses of fluid.  He now states that his methadone ingestion of 30 tablets was last week.  He now states that today he took 20 Xanax tablets, prior to coming in, to get high.  He has no further complaints other than worry about what it will be like, when he is homeless and gets cold.  Patient will be discharged, at this time.   Mancel Bale, MD 06/24/18 2308

## 2018-06-24 NOTE — Discharge Instructions (Addendum)
Try to avoid using drugs or medications which were not prescribed for you.  Follow-up with the [facility] for further care and treatment.  Go there tomorrow.
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
## 2018-06-24 NOTE — ED Notes (Signed)
Bed: WA11 Expected date:  Expected time:  Means of arrival:  Comments: EMS-OD? 

## 2018-07-28 ENCOUNTER — Encounter (HOSPITAL_COMMUNITY): Payer: Self-pay | Admitting: Emergency Medicine

## 2018-07-28 ENCOUNTER — Emergency Department (HOSPITAL_COMMUNITY)
Admission: EM | Admit: 2018-07-28 | Discharge: 2018-07-28 | Disposition: A | Discharge status: Home / Self Care | Payer: Self-pay | Attending: Emergency Medicine | Admitting: Emergency Medicine

## 2018-07-28 ENCOUNTER — Other Ambulatory Visit: Payer: Self-pay

## 2018-07-28 DIAGNOSIS — Z5321 Procedure and treatment not carried out due to patient leaving prior to being seen by health care provider: Secondary | ICD-10-CM | POA: Insufficient documentation

## 2018-07-28 DIAGNOSIS — F10129 Alcohol abuse with intoxication, unspecified: Secondary | ICD-10-CM | POA: Insufficient documentation

## 2018-07-28 NOTE — ED Notes (Signed)
Bed: WHALD Expected date:  Expected time:  Means of arrival:  Comments: EMS ETOH 

## 2018-07-28 NOTE — ED Triage Notes (Signed)
Per [facility] [place] and states he drank 1/5th alcohol. "I was just trying to sleep it off".

## 2018-12-03 ENCOUNTER — Encounter (HOSPITAL_COMMUNITY): Payer: Self-pay

## 2018-12-03 ENCOUNTER — Inpatient Hospital Stay (HOSPITAL_COMMUNITY)
Admission: EM | Admit: 2018-12-03 | Discharge: 2018-12-11 | DRG: 917 | Disposition: A | Discharge status: Other Acute Inpatient Hospital | Payer: Self-pay | Attending: Family Medicine | Admitting: Family Medicine

## 2018-12-03 ENCOUNTER — Observation Stay (HOSPITAL_COMMUNITY): Payer: Self-pay

## 2018-12-03 ENCOUNTER — Other Ambulatory Visit: Payer: Self-pay

## 2018-12-03 DIAGNOSIS — T50901A Poisoning by unspecified drugs, medicaments and biological substances, accidental (unintentional), initial encounter: Secondary | ICD-10-CM | POA: Diagnosis present

## 2018-12-03 DIAGNOSIS — F10920 Alcohol use, unspecified with intoxication, uncomplicated: Secondary | ICD-10-CM

## 2018-12-03 DIAGNOSIS — Z915 Personal history of self-harm: Secondary | ICD-10-CM

## 2018-12-03 DIAGNOSIS — T43222A Poisoning by selective serotonin reuptake inhibitors, intentional self-harm, initial encounter: Principal | ICD-10-CM | POA: Diagnosis present

## 2018-12-03 DIAGNOSIS — F419 Anxiety disorder, unspecified: Secondary | ICD-10-CM | POA: Diagnosis present

## 2018-12-03 DIAGNOSIS — T383X2A Poisoning by insulin and oral hypoglycemic [antidiabetic] drugs, intentional self-harm, initial encounter: Secondary | ICD-10-CM | POA: Diagnosis present

## 2018-12-03 DIAGNOSIS — E162 Hypoglycemia, unspecified: Secondary | ICD-10-CM | POA: Diagnosis present

## 2018-12-03 DIAGNOSIS — T510X1A Toxic effect of ethanol, accidental (unintentional), initial encounter: Secondary | ICD-10-CM | POA: Diagnosis present

## 2018-12-03 DIAGNOSIS — J189 Pneumonia, unspecified organism: Secondary | ICD-10-CM

## 2018-12-03 DIAGNOSIS — T1491XA Suicide attempt, initial encounter: Secondary | ICD-10-CM

## 2018-12-03 DIAGNOSIS — Z79899 Other long term (current) drug therapy: Secondary | ICD-10-CM

## 2018-12-03 DIAGNOSIS — F1010 Alcohol abuse, uncomplicated: Secondary | ICD-10-CM | POA: Diagnosis present

## 2018-12-03 DIAGNOSIS — R0682 Tachypnea, not elsewhere classified: Secondary | ICD-10-CM

## 2018-12-03 DIAGNOSIS — R6510 Systemic inflammatory response syndrome (SIRS) of non-infectious origin without acute organ dysfunction: Secondary | ICD-10-CM | POA: Diagnosis present

## 2018-12-03 DIAGNOSIS — Z8249 Family history of ischemic heart disease and other diseases of the circulatory system: Secondary | ICD-10-CM

## 2018-12-03 DIAGNOSIS — R402422 Glasgow coma scale score 9-12, at arrival to emergency department: Secondary | ICD-10-CM | POA: Diagnosis present

## 2018-12-03 DIAGNOSIS — R443 Hallucinations, unspecified: Secondary | ICD-10-CM | POA: Diagnosis present

## 2018-12-03 DIAGNOSIS — F1290 Cannabis use, unspecified, uncomplicated: Secondary | ICD-10-CM | POA: Diagnosis present

## 2018-12-03 DIAGNOSIS — Y907 Blood alcohol level of 200-239 mg/100 ml: Secondary | ICD-10-CM | POA: Diagnosis present

## 2018-12-03 DIAGNOSIS — F319 Bipolar disorder, unspecified: Secondary | ICD-10-CM | POA: Diagnosis present

## 2018-12-03 DIAGNOSIS — Z833 Family history of diabetes mellitus: Secondary | ICD-10-CM

## 2018-12-03 DIAGNOSIS — T50902A Poisoning by unspecified drugs, medicaments and biological substances, intentional self-harm, initial encounter: Secondary | ICD-10-CM

## 2018-12-03 DIAGNOSIS — E876 Hypokalemia: Secondary | ICD-10-CM | POA: Diagnosis present

## 2018-12-03 DIAGNOSIS — G4700 Insomnia, unspecified: Secondary | ICD-10-CM | POA: Diagnosis present

## 2018-12-03 DIAGNOSIS — T383X1A Poisoning by insulin and oral hypoglycemic [antidiabetic] drugs, accidental (unintentional), initial encounter: Secondary | ICD-10-CM | POA: Diagnosis present

## 2018-12-03 DIAGNOSIS — G92 Toxic encephalopathy: Secondary | ICD-10-CM | POA: Diagnosis present

## 2018-12-03 DIAGNOSIS — R339 Retention of urine, unspecified: Secondary | ICD-10-CM | POA: Diagnosis present

## 2018-12-03 DIAGNOSIS — F17210 Nicotine dependence, cigarettes, uncomplicated: Secondary | ICD-10-CM | POA: Diagnosis present

## 2018-12-03 LAB — BASIC METABOLIC PANEL
ANION GAP: 8 (ref 5–15)
ANION GAP: 8 (ref 5–15)
ANION GAP: 7 (ref 5–15)
Anion gap: 8 (ref 5–15)
BUN: 6 mg/dL (ref 6–20)
BUN: <5 mg/dL — ABNORMAL LOW (ref 6–20)
BUN: 5 mg/dL — ABNORMAL LOW (ref 6–20)
BUN: 8 mg/dL (ref 6–20)
CALCIUM: 9 mg/dL (ref 8.9–10.3)
CO2: 22 mmol/L (ref 22–32)
CO2: 24 mmol/L (ref 22–32)
CO2: 24 mmol/L (ref 22–32)
CO2: 21 mmol/L — ABNORMAL LOW (ref 22–32)
CREATININE: 0.72 mg/dL (ref 0.61–1.24)
Calcium: 8.3 mg/dL — ABNORMAL LOW (ref 8.9–10.3)
Calcium: 9.2 mg/dL (ref 8.9–10.3)
Calcium: 8.6 mg/dL — ABNORMAL LOW (ref 8.9–10.3)
Chloride: 105 mmol/L (ref 98–111)
Chloride: 103 mmol/L (ref 98–111)
Chloride: 104 mmol/L (ref 98–111)
Chloride: 108 mmol/L (ref 98–111)
Creatinine, Ser: 0.7 mg/dL (ref 0.61–1.24)
Creatinine, Ser: 0.73 mg/dL (ref 0.61–1.24)
Creatinine, Ser: 0.78 mg/dL (ref 0.61–1.24)
GFR calc Af Amer: >60 mL/min (ref >60)
GFR calc Af Amer: >60 mL/min (ref >60)
GFR calc Af Amer: >60 mL/min (ref >60)
GFR calc Af Amer: >60 mL/min (ref >60)
GFR calc non Af Amer: >60 mL/min (ref >60)
GFR calc non Af Amer: >60 mL/min (ref >60)
GFR calc non Af Amer: >60 mL/min (ref >60)
GFR calc non Af Amer: >60 mL/min (ref >60)
GLUCOSE: 99 mg/dL (ref 70–99)
Glucose, Bld: 118 mg/dL — ABNORMAL HIGH (ref 70–99)
Glucose, Bld: 80 mg/dL (ref 70–99)
Glucose, Bld: 81 mg/dL (ref 70–99)
POTASSIUM: 3.6 mmol/L (ref 3.5–5.1)
Potassium: 3.7 mmol/L (ref 3.5–5.1)
Potassium: 3.6 mmol/L (ref 3.5–5.1)
Potassium: 3.1 mmol/L — ABNORMAL LOW (ref 3.5–5.1)
Sodium: 135 mmol/L (ref 135–145)
Sodium: 135 mmol/L (ref 135–145)
Sodium: 136 mmol/L (ref 135–145)
Sodium: 136 mmol/L (ref 135–145)

## 2018-12-03 LAB — CBC WITH DIFFERENTIAL/PLATELET
Abs Immature Granulocytes: 0.4 10*3/uL — ABNORMAL HIGH (ref 0.00–0.07)
Basophils Absolute: 0.1 10*3/uL (ref 0.0–0.1)
Basophils Relative: 0 %
Eosinophils Absolute: 0 10*3/uL (ref 0.0–0.5)
Eosinophils Relative: 0 %
HCT: 44.4 % (ref 39.0–52.0)
Hemoglobin: 15.1 g/dL (ref 13.0–17.0)
IMMATURE GRANULOCYTES: 2 %
Lymphocytes Relative: 6 %
Lymphs Abs: 1.2 10*3/uL (ref 0.7–4.0)
MCH: 33.5 pg (ref 26.0–34.0)
MCHC: 34 g/dL (ref 30.0–36.0)
MCV: 98.4 fL (ref 80.0–100.0)
Monocytes Absolute: 1.4 10*3/uL — ABNORMAL HIGH (ref 0.1–1.0)
Monocytes Relative: 7 %
Neutro Abs: 17.7 10*3/uL — ABNORMAL HIGH (ref 1.7–7.7)
Neutrophils Relative %: 85 %
Platelets: 271 10*3/uL (ref 150–400)
RBC: 4.51 MIL/uL (ref 4.22–5.81)
RDW: 12.6 % (ref 11.5–15.5)
WBC: 20.7 10*3/uL — ABNORMAL HIGH (ref 4.0–10.5)
nRBC: 0 % (ref 0.0–0.2)

## 2018-12-03 LAB — COMPREHENSIVE METABOLIC PANEL
ALT: 20 U/L (ref 0–44)
AST: 25 U/L (ref 15–41)
Albumin: 4.2 g/dL (ref 3.5–5.0)
Alkaline Phosphatase: 69 U/L (ref 38–126)
Anion gap: 11 (ref 5–15)
BUN: 8 mg/dL (ref 6–20)
CO2: 20 mmol/L — ABNORMAL LOW (ref 22–32)
Calcium: 8.5 mg/dL — ABNORMAL LOW (ref 8.9–10.3)
Chloride: 108 mmol/L (ref 98–111)
Creatinine, Ser: 0.78 mg/dL (ref 0.61–1.24)
GFR calc Af Amer: >60 mL/min (ref >60)
GFR calc non Af Amer: >60 mL/min (ref >60)
Glucose, Bld: 40 mg/dL — CL (ref 70–99)
Potassium: 2.9 mmol/L — ABNORMAL LOW (ref 3.5–5.1)
Sodium: 139 mmol/L (ref 135–145)
Total Bilirubin: 0.2 mg/dL — ABNORMAL LOW (ref 0.3–1.2)
Total Protein: 7.4 g/dL (ref 6.5–8.1)

## 2018-12-03 LAB — GLUCOSE, CAPILLARY
GLUCOSE-CAPILLARY: 116 mg/dL — AB (ref 70–99)
GLUCOSE-CAPILLARY: 125 mg/dL — AB (ref 70–99)
GLUCOSE-CAPILLARY: 53 mg/dL — AB (ref 70–99)
GLUCOSE-CAPILLARY: 86 mg/dL (ref 70–99)
GLUCOSE-CAPILLARY: 86 mg/dL (ref 70–99)
GLUCOSE-CAPILLARY: 89 mg/dL (ref 70–99)
GLUCOSE-CAPILLARY: 99 mg/dL (ref 70–99)
Glucose-Capillary: 61 mg/dL — ABNORMAL LOW (ref 70–99)
Glucose-Capillary: 132 mg/dL — ABNORMAL HIGH (ref 70–99)
Glucose-Capillary: 33 mg/dL — CL (ref 70–99)
Glucose-Capillary: 49 mg/dL — ABNORMAL LOW (ref 70–99)
Glucose-Capillary: 135 mg/dL — ABNORMAL HIGH (ref 70–99)
Glucose-Capillary: 172 mg/dL — ABNORMAL HIGH (ref 70–99)
Glucose-Capillary: 56 mg/dL — ABNORMAL LOW (ref 70–99)
Glucose-Capillary: 89 mg/dL (ref 70–99)
Glucose-Capillary: 116 mg/dL — ABNORMAL HIGH (ref 70–99)
Glucose-Capillary: 156 mg/dL — ABNORMAL HIGH (ref 70–99)
Glucose-Capillary: 53 mg/dL — ABNORMAL LOW (ref 70–99)
Glucose-Capillary: 61 mg/dL — ABNORMAL LOW (ref 70–99)
Glucose-Capillary: 133 mg/dL — ABNORMAL HIGH (ref 70–99)
Glucose-Capillary: 47 mg/dL — ABNORMAL LOW (ref 70–99)
Glucose-Capillary: 81 mg/dL (ref 70–99)
Glucose-Capillary: 113 mg/dL — ABNORMAL HIGH (ref 70–99)
Glucose-Capillary: 74 mg/dL (ref 70–99)
Glucose-Capillary: 161 mg/dL — ABNORMAL HIGH (ref 70–99)
Glucose-Capillary: 57 mg/dL — ABNORMAL LOW (ref 70–99)
Glucose-Capillary: 67 mg/dL — ABNORMAL LOW (ref 70–99)
Glucose-Capillary: 70 mg/dL (ref 70–99)
Glucose-Capillary: 76 mg/dL (ref 70–99)
Glucose-Capillary: 126 mg/dL — ABNORMAL HIGH (ref 70–99)
Glucose-Capillary: 148 mg/dL — ABNORMAL HIGH (ref 70–99)
Glucose-Capillary: 71 mg/dL (ref 70–99)
Glucose-Capillary: 116 mg/dL — ABNORMAL HIGH (ref 70–99)
Glucose-Capillary: 184 mg/dL — ABNORMAL HIGH (ref 70–99)
Glucose-Capillary: 70 mg/dL (ref 70–99)
Glucose-Capillary: 93 mg/dL (ref 70–99)
Glucose-Capillary: 75 mg/dL (ref 70–99)
Glucose-Capillary: 77 mg/dL (ref 70–99)
Glucose-Capillary: 80 mg/dL (ref 70–99)
Glucose-Capillary: 89 mg/dL (ref 70–99)
Glucose-Capillary: 105 mg/dL — ABNORMAL HIGH (ref 70–99)
Glucose-Capillary: 79 mg/dL (ref 70–99)
Glucose-Capillary: 80 mg/dL (ref 70–99)
Glucose-Capillary: 87 mg/dL (ref 70–99)
Glucose-Capillary: 90 mg/dL (ref 70–99)
Glucose-Capillary: 96 mg/dL (ref 70–99)
Glucose-Capillary: 98 mg/dL (ref 70–99)
Glucose-Capillary: 98 mg/dL (ref 70–99)
Glucose-Capillary: 74 mg/dL (ref 70–99)
Glucose-Capillary: 96 mg/dL (ref 70–99)
Glucose-Capillary: 99 mg/dL (ref 70–99)
Glucose-Capillary: 103 mg/dL — ABNORMAL HIGH (ref 70–99)
Glucose-Capillary: 89 mg/dL (ref 70–99)
Glucose-Capillary: 97 mg/dL (ref 70–99)
Glucose-Capillary: 103 mg/dL — ABNORMAL HIGH (ref 70–99)
Glucose-Capillary: 107 mg/dL — ABNORMAL HIGH (ref 70–99)
Glucose-Capillary: 137 mg/dL — ABNORMAL HIGH (ref 70–99)
Glucose-Capillary: 99 mg/dL (ref 70–99)
Glucose-Capillary: 111 mg/dL — ABNORMAL HIGH (ref 70–99)
Glucose-Capillary: 99 mg/dL (ref 70–99)
Glucose-Capillary: 99 mg/dL (ref 70–99)
Glucose-Capillary: 111 mg/dL — ABNORMAL HIGH (ref 70–99)
Glucose-Capillary: 113 mg/dL — ABNORMAL HIGH (ref 70–99)

## 2018-12-03 LAB — URINALYSIS, ROUTINE W REFLEX MICROSCOPIC
Bilirubin Urine: NEGATIVE
Glucose, UA: 150 mg/dL — AB
Hgb urine dipstick: NEGATIVE
KETONES UR: NEGATIVE mg/dL
Leukocytes,Ua: NEGATIVE
Nitrite: POSITIVE — AB
Protein, ur: NEGATIVE mg/dL
Specific Gravity, Urine: 1.013 (ref 1.005–1.030)
pH: 5 (ref 5.0–8.0)

## 2018-12-03 LAB — RAPID URINE DRUG SCREEN, HOSP PERFORMED
Amphetamines: NOT DETECTED
Barbiturates: NOT DETECTED
Benzodiazepines: NOT DETECTED
Cocaine: NOT DETECTED
Opiates: NOT DETECTED
Tetrahydrocannabinol: POSITIVE — AB

## 2018-12-03 LAB — BLOOD GAS, ARTERIAL
Acid-base deficit: 1 mmol/L (ref 0.0–2.0)
Bicarbonate: 22.5 mmol/L (ref 20.0–28.0)
Drawn by: 270211
O2 Content: 2 L/min
O2 Saturation: 98.3 %
PATIENT TEMPERATURE: 98.6
pCO2 arterial: 35.9 mmHg (ref 32.0–48.0)
pH, Arterial: 7.414 (ref 7.350–7.450)
pO2, Arterial: 119 mmHg — ABNORMAL HIGH (ref 83.0–108.0)

## 2018-12-03 LAB — CBG MONITORING, ED
Glucose-Capillary: 27 mg/dL — CL (ref 70–99)
Glucose-Capillary: 36 mg/dL — CL (ref 70–99)
Glucose-Capillary: 153 mg/dL — ABNORMAL HIGH (ref 70–99)
Glucose-Capillary: 49 mg/dL — ABNORMAL LOW (ref 70–99)
Glucose-Capillary: 47 mg/dL — ABNORMAL LOW (ref 70–99)

## 2018-12-03 LAB — ETHANOL: Alcohol, Ethyl (B): 204 mg/dL — ABNORMAL HIGH (ref <10)

## 2018-12-03 LAB — MRSA PCR SCREENING: MRSA by PCR: NEGATIVE

## 2018-12-03 LAB — MAGNESIUM: Magnesium: 2.3 mg/dL (ref 1.7–2.4)

## 2018-12-03 LAB — HEMOGLOBIN A1C
Hgb A1c MFr Bld: 5.2 % (ref 4.8–5.6)
Mean Plasma Glucose: 102.54 mg/dL

## 2018-12-03 LAB — SALICYLATE LEVEL: Salicylate Lvl: <7 mg/dL (ref 2.8–30.0)

## 2018-12-03 LAB — ACETAMINOPHEN LEVEL

## 2018-12-03 MED ORDER — DEXTROSE 50 % IV SOLN
INTRAVENOUS | Status: AC
  Administered 2018-12-03: 50 mL
  Filled 2018-12-03: qty 150

## 2018-12-03 MED ORDER — DEXTROSE 50 % IV SOLN
INTRAVENOUS | Status: AC
  Administered 2018-12-03: 50 mL
  Filled 2018-12-03: qty 50

## 2018-12-03 MED ORDER — LABETALOL HCL 5 MG/ML IV SOLN
10.0000 mg | INTRAVENOUS | Status: DC | PRN
  Filled 2018-12-03: qty 4

## 2018-12-03 MED ORDER — DEXTROSE 10 % IV SOLN
INTRAVENOUS | Status: DC
  Administered 2018-12-03 (×2): via INTRAVENOUS
  Filled 2018-12-03 (×3): qty 1000

## 2018-12-03 MED ORDER — LACTATED RINGERS IV BOLUS
1000.0000 mL | Freq: Once | INTRAVENOUS | Status: AC
  Administered 2018-12-03: 1000 mL via INTRAVENOUS

## 2018-12-03 MED ORDER — DEXTROSE 50 % IV SOLN
50.0000 mL | Freq: Once | INTRAVENOUS | Status: AC
  Administered 2018-12-03: 50 mL via INTRAVENOUS

## 2018-12-03 MED ORDER — POTASSIUM CHLORIDE 10 MEQ/100ML IV SOLN
10.0000 meq | INTRAVENOUS | Status: AC
  Administered 2018-12-03 (×3): 10 meq via INTRAVENOUS
  Filled 2018-12-03 (×3): qty 100

## 2018-12-03 MED ORDER — CHLORDIAZEPOXIDE HCL 25 MG PO CAPS
25.0000 mg | ORAL_CAPSULE | Freq: Four times a day (QID) | ORAL | Status: AC
  Administered 2018-12-03 (×4): 25 mg via ORAL
  Filled 2018-12-03 (×4): qty 1

## 2018-12-03 MED ORDER — DEXTROSE 50 % IV SOLN
25.0000 g | INTRAVENOUS | Status: AC
  Administered 2018-12-03: 25 g via INTRAVENOUS

## 2018-12-03 MED ORDER — DEXTROSE 50 % IV SOLN
25.0000 g | Freq: Once | INTRAVENOUS | Status: AC
  Administered 2018-12-03: 25 g via INTRAVENOUS

## 2018-12-03 MED ORDER — ADULT MULTIVITAMIN W/MINERALS CH
1.0000 | ORAL_TABLET | Freq: Every day | ORAL | Status: DC
  Administered 2018-12-03 – 2018-12-08 (×6): 1 via ORAL
  Filled 2018-12-03 (×6): qty 1

## 2018-12-03 MED ORDER — DEXTROSE 50 % IV SOLN
1.0000 | Freq: Once | INTRAVENOUS | Status: AC
  Administered 2018-12-03: 50 mL via INTRAVENOUS
  Filled 2018-12-03: qty 50

## 2018-12-03 MED ORDER — CHLORDIAZEPOXIDE HCL 25 MG PO CAPS
25.0000 mg | ORAL_CAPSULE | Freq: Every day | ORAL | Status: AC
  Administered 2018-12-06: 25 mg via ORAL
  Filled 2018-12-03: qty 1

## 2018-12-03 MED ORDER — CHLORDIAZEPOXIDE HCL 25 MG PO CAPS
25.0000 mg | ORAL_CAPSULE | Freq: Four times a day (QID) | ORAL | Status: AC | PRN
  Administered 2018-12-03: 25 mg via ORAL
  Filled 2018-12-03: qty 1

## 2018-12-03 MED ORDER — DEXTROSE 50 % IV SOLN
INTRAVENOUS | Status: AC
  Filled 2018-12-03: qty 50

## 2018-12-03 MED ORDER — LORAZEPAM 2 MG/ML IJ SOLN
2.0000 mg | Freq: Once | INTRAMUSCULAR | Status: AC
  Administered 2018-12-03: 2 mg via INTRAVENOUS

## 2018-12-03 MED ORDER — DEXTROSE 50 % IV SOLN
1.0000 | Freq: Once | INTRAVENOUS | Status: AC
  Administered 2018-12-03: 50 mL via INTRAVENOUS

## 2018-12-03 MED ORDER — LOPERAMIDE HCL 2 MG PO CAPS: 2.0000 mg | ORAL_CAPSULE | ORAL | Status: DC | PRN

## 2018-12-03 MED ORDER — CHLORDIAZEPOXIDE HCL 25 MG PO CAPS
25.0000 mg | ORAL_CAPSULE | Freq: Three times a day (TID) | ORAL | Status: AC
  Administered 2018-12-04 (×3): 25 mg via ORAL
  Filled 2018-12-03 (×3): qty 1

## 2018-12-03 MED ORDER — THIAMINE HCL 100 MG/ML IJ SOLN
100.0000 mg | Freq: Once | INTRAMUSCULAR | Status: AC
  Administered 2018-12-03: 100 mg via INTRAMUSCULAR
  Filled 2018-12-03: qty 2

## 2018-12-03 MED ORDER — LORAZEPAM 2 MG/ML IJ SOLN
INTRAMUSCULAR | Status: AC
  Filled 2018-12-03: qty 1

## 2018-12-03 MED ORDER — PIPERACILLIN-TAZOBACTAM 3.375 G IVPB
3.3750 g | Freq: Three times a day (TID) | INTRAVENOUS | Status: DC
  Administered 2018-12-03 – 2018-12-05 (×7): 3.375 g via INTRAVENOUS
  Filled 2018-12-03 (×7): qty 50

## 2018-12-03 MED ORDER — VITAMIN B-1 100 MG PO TABS
100.0000 mg | ORAL_TABLET | Freq: Every day | ORAL | Status: DC
  Administered 2018-12-04 – 2018-12-08 (×5): 100 mg via ORAL
  Filled 2018-12-03 (×5): qty 1

## 2018-12-03 MED ORDER — CHLORDIAZEPOXIDE HCL 25 MG PO CAPS
25.0000 mg | ORAL_CAPSULE | ORAL | Status: AC
  Administered 2018-12-05 (×2): 25 mg via ORAL
  Filled 2018-12-03 (×2): qty 1

## 2018-12-03 MED ORDER — HYDROXYZINE HCL 25 MG PO TABS
25.0000 mg | ORAL_TABLET | Freq: Four times a day (QID) | ORAL | Status: AC | PRN
  Administered 2018-12-05 (×2): 25 mg via ORAL
  Filled 2018-12-03 (×2): qty 1

## 2018-12-03 MED ORDER — POTASSIUM CHLORIDE 10 MEQ/100ML IV SOLN
10.0000 meq | INTRAVENOUS | Status: AC
  Administered 2018-12-03 (×2): 10 meq via INTRAVENOUS

## 2018-12-03 MED ORDER — ONDANSETRON 4 MG PO TBDP: 4.0000 mg | ORAL_TABLET | Freq: Four times a day (QID) | ORAL | Status: DC | PRN

## 2018-12-03 MED ORDER — DEXTROSE 10 % IV SOLN
INTRAVENOUS | Status: DC
  Administered 2018-12-03 – 2018-12-05 (×6): via INTRAVENOUS
  Filled 2018-12-03 (×13): qty 1000
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
## 2018-12-03 NOTE — Progress Notes (Addendum)
Hypoglycemic Event  CBG: 56  Treatment: 25 grams 50% dextrose  Symptoms:agitation, restlessness, elevated HR   Follow-up CBG: Time:0805 CBG Result:172  Possible Reasons for Event: overdose on insulin  Comments/MD notified:MD at bedside.    Wende Crease
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
## 2018-12-03 NOTE — Progress Notes (Signed)
Hypoglycemic Event  CBG: 53  Treatment: 25 grams 50% dextrose  Symptoms: anxious, tachycardic  Follow-up CBG: Time:1120 CBG Result:113  Possible Reasons for Event: insulin overdose  Comments/MD notified:Contacted Dr. Lowell Guitar and updated this is the eighth D50% amp. Given to this patient since 0700.  MD to review plan of care.    Wende Crease

## 2018-12-03 NOTE — ED Notes (Signed)
Dr Eudelia Bunch aware of CBG pharmacy called for D10 to be run at 250 ml /hr via pump

## 2018-12-03 NOTE — ED Notes (Signed)
ED TO INPATIENT HANDOFF REPORT  Name/Age/Gender Riley [name] 28 y.o. male  Code Status Code Status History    Date Active Date Inactive Code Status Order ID Comments User Context   12/29/2017 1649 12/30/2017 1329 Full Code [id number]  Dietrich PatesKhatri, Hina, PA-C ED   12/21/2017 0718 12/24/2017 1009 Full Code [id number]  Jackelyn PolingBerry, Jason A, NP Inpatient   12/21/2017 0223 12/21/2017 0656 Full Code [id number]  Roxy HorsemanBrowning, Solmon, PA-C ED   12/13/2017 2215 12/20/2017 0035 Full Code [id number]  Talmage Napankin, [name], NP Inpatient   12/12/2017 0556 12/13/2017 2150 Full Code [id number]  Briscoe Deutscherpyd, Timothy S, MD ED      Home/SNF/Other Nursing Home  Chief Complaint Drug Overdose  Level of Care/Admitting Diagnosis ED Disposition    ED Disposition Condition Comment   Admit  [facility] [[id number]]  Level of Care: ICU [6]  Diagnosis: Insulin overdose [223770]  Admitting Physician: Briscoe DeutscherPYD, TIMOTHY S [[id number]]  Attending Physician: Briscoe DeutscherPYD, TIMOTHY S [[id number]]  PT Class (Do Not Modify): Observation [104]  PT Acc Code (Do Not Modify): Observation [10022]       Medical History Past Medical History:  Diagnosis Date  . Alcoholism (HCC)     Allergies No Known Allergies  IV Location/Drains/Wounds Patient Lines/Drains/Airways Status   Active Line/Drains/Airways    Name:   Placement date:   Placement time:   Site:   Days:   Peripheral IV 12/03/18 Right Antecubital   12/03/18    0309    Antecubital   less than 1          Labs/Imaging Results for orders placed or performed during the hospital encounter of 12/03/18 (from the past 48 hour(s))  POC CBG, ED     Status: Abnormal   Collection Time: 12/03/18  3:27 AM  Result Value Ref Range   Glucose-Capillary 27 (LL) 70 - 99 mg/dL  CBG monitoring, ED     Status: Abnormal   Collection Time: 12/03/18  4:02 AM  Result Value Ref Range   Glucose-Capillary 36 (LL) 70 - 99 mg/dL  CBC with Differential     Status: Abnormal   Collection Time: 12/03/18   4:09 AM  Result Value Ref Range   WBC 20.7 (H) 4.0 - 10.5 K/uL   RBC 4.51 4.22 - 5.81 MIL/uL   Hemoglobin 15.1 13.0 - 17.0 g/dL   HCT 70.644.4 23.739.0 - 62.852.0 %   MCV 98.4 80.0 - 100.0 fL   MCH 33.5 26.0 - 34.0 pg   MCHC 34.0 30.0 - 36.0 g/dL   RDW 31.512.6 17.611.5 - 16.015.5 %   Platelets 271 150 - 400 K/uL   nRBC 0.0 0.0 - 0.2 %   Neutrophils Relative % 85 %   Neutro Abs 17.7 (H) 1.7 - 7.7 K/uL   Lymphocytes Relative 6 %   Lymphs Abs 1.2 0.7 - 4.0 K/uL   Monocytes Relative 7 %   Monocytes Absolute 1.4 (H) 0.1 - 1.0 K/uL   Eosinophils Relative 0 %   Eosinophils Absolute 0.0 0.0 - 0.5 K/uL   Basophils Relative 0 %   Basophils Absolute 0.1 0.0 - 0.1 K/uL   Immature Granulocytes 2 %   Abs Immature Granulocytes 0.40 (H) 0.00 - 0.07 K/uL    Comment: Performed at [facility], [address] [place], [place] [address]  Salicylate level     Status: None   Collection Time: 12/03/18  4:09 AM  Result Value Ref Range   Salicylate Lvl <7.0 2.8 -  30.0 mg/dL    Comment: Performed at [facility], [address] [place], [place] [address]  Ethanol     Status: Abnormal   Collection Time: 12/03/18  4:09 AM  Result Value Ref Range   Alcohol, Ethyl (B) 204 (H) <10 mg/dL    Comment: (NOTE) Lowest detectable limit for serum alcohol is 10 mg/dL. For medical purposes only. Performed at [facility], [address] [place], [place] [address]   Acetaminophen level     Status: Abnormal   Collection Time: 12/03/18  4:09 AM  Result Value Ref Range   Acetaminophen (Tylenol), Serum <10 (L) 10 - 30 ug/mL    Comment: (NOTE) Therapeutic concentrations vary significantly. A range of 10-30 ug/mL  may be an effective concentration for many patients. However, some  are best treated at concentrations outside of this range. Acetaminophen concentrations >150 ug/mL at 4 hours after ingestion  and >50 ug/mL at 12 hours after ingestion are often associated with  toxic  reactions. Performed at [facility], [address] [place], [place] [address]   Comprehensive metabolic panel     Status: Abnormal   Collection Time: 12/03/18  4:09 AM  Result Value Ref Range   Sodium 139 135 - 145 mmol/L   Potassium 2.9 (L) 3.5 - 5.1 mmol/L   Chloride 108 98 - 111 mmol/L   CO2 20 (L) 22 - 32 mmol/L   Glucose, Bld 40 (LL) 70 - 99 mg/dL    Comment: CRITICAL RESULT CALLED TO, READ BACK BY AND VERIFIED WITH: A ALILX RN 0455 12/03/18 A NAVARRO    BUN 8 6 - 20 mg/dL   Creatinine, Ser 2.130.78 0.61 - 1.24 mg/dL   Calcium 8.5 (L) 8.9 - 10.3 mg/dL   Total Protein 7.4 6.5 - 8.1 g/dL   Albumin 4.2 3.5 - 5.0 g/dL   AST 25 15 - 41 U/L   ALT 20 0 - 44 U/L   Alkaline Phosphatase 69 38 - 126 U/L   Total Bilirubin 0.2 (L) 0.3 - 1.2 mg/dL   GFR calc non Af Amer >60 >60 mL/min   GFR calc Af Amer >60 >60 mL/min   Anion gap 11 5 - 15    Comment: Performed at [facility], [address] [place], [place] [address]  Urine rapid drug screen (hosp performed)     Status: Abnormal   Collection Time: 12/03/18  4:09 AM  Result Value Ref Range   Opiates NONE DETECTED NONE DETECTED   Cocaine NONE DETECTED NONE DETECTED   Benzodiazepines NONE DETECTED NONE DETECTED   Amphetamines NONE DETECTED NONE DETECTED   Tetrahydrocannabinol POSITIVE (A) NONE DETECTED   Barbiturates NONE DETECTED NONE DETECTED    Comment: (NOTE) DRUG SCREEN FOR MEDICAL PURPOSES ONLY.  IF CONFIRMATION IS NEEDED FOR ANY PURPOSE, NOTIFY LAB WITHIN 5 DAYS. LOWEST DETECTABLE LIMITS FOR URINE DRUG SCREEN Drug Class                     Cutoff (ng/mL) Amphetamine and metabolites    1000 Barbiturate and metabolites    200 Benzodiazepine                 200 Tricyclics and metabolites     300 Opiates and metabolites        300 Cocaine and metabolites        300 THC  50 Performed at [facility], [address], [place], [place]  [address]   Urinalysis, Routine w reflex microscopic     Status: Abnormal   Collection Time: 12/03/18  4:09 AM  Result Value Ref Range   Color, Urine AMBER (A) YELLOW    Comment: BIOCHEMICALS MAY BE AFFECTED BY COLOR   APPearance CLEAR CLEAR   Specific Gravity, Urine 1.013 1.005 - 1.030   pH 5.0 5.0 - 8.0   Glucose, UA 150 (A) NEGATIVE mg/dL   Hgb urine dipstick NEGATIVE NEGATIVE   Bilirubin Urine NEGATIVE NEGATIVE   Ketones, ur NEGATIVE NEGATIVE mg/dL   Protein, ur NEGATIVE NEGATIVE mg/dL   Nitrite POSITIVE (A) NEGATIVE   Leukocytes,Ua NEGATIVE NEGATIVE   RBC / HPF 0-5 0 - 5 RBC/hpf   WBC, UA 6-10 0 - 5 WBC/hpf   Bacteria, UA RARE (A) NONE SEEN   Squamous Epithelial / LPF 0-5 0 - 5   Mucus PRESENT    Hyaline Casts, UA PRESENT    Amorphous Crystal PRESENT     Comment: Performed at [facility], [address] [place], [place] [address]  Magnesium     Status: None   Collection Time: 12/03/18  4:09 AM  Result Value Ref Range   Magnesium 2.3 1.7 - 2.4 mg/dL    Comment: Performed at [facility], [address], [place], [place] [address]  CBG monitoring, ED     Status: Abnormal   Collection Time: 12/03/18  4:34 AM  Result Value Ref Range   Glucose-Capillary 153 (H) 70 - 99 mg/dL  CBG monitoring, ED     Status: Abnormal   Collection Time: 12/03/18  4:55 AM  Result Value Ref Range   Glucose-Capillary 49 (L) 70 - 99 mg/dL  CBG monitoring, ED     Status: Abnormal   Collection Time: 12/03/18  5:34 AM  Result Value Ref Range   Glucose-Capillary 47 (L) 70 - 99 mg/dL   No results found.  Pending Labs Unresulted Labs (From admission, onward)   None      Vitals/Pain Today's Vitals   12/03/18 0303 12/03/18 0345 12/03/18 0503 12/03/18 0545  BP:  136/87 133/77 (!) 147/74  Pulse:  (!) 130 (!) 128 (!) 128  Resp:  (!) 34 (!) 33 (!) 42  SpO2:  94% 97% 99%  PainSc: 0-No pain       Isolation Precautions No active  isolations  Medications Medications  dextrose 10 % infusion ( Intravenous New Bag/Given 12/03/18 0421)  dextrose 50 % solution (50 mLs  Given 12/03/18 0345)  dextrose 50 % solution (50 mLs  Given 12/03/18 0420)  dextrose 50 % solution (50 mLs  Given 12/03/18 0502)    Mobility non-ambulatory at this time

## 2018-12-03 NOTE — Progress Notes (Signed)
RN contacted by poison control directed to repeat sodium labs and to avoid being too aggressive with replacing pt. Potassium.  RN updated MD, labs already placed and RN directed to hold off on last Potassium run.  RN directed to contact pt. Roommate to see if patient could have taken Sulfonaurea's, RN unable to reach pt. Roommate Riley [name] at this time.  RN will continue to follow up.

## 2018-12-03 NOTE — Progress Notes (Signed)
Hypoglycemic Event  CBG: 61  Treatment: 25grams 50% dextrose  Symptoms: elevated HR, anxious  Follow-up CBG: [id number] CBG Result:133  Possible Reasons for Event: insulin overdose  Comments/MD notified:MD aware    Wende Crease

## 2018-12-03 NOTE — Progress Notes (Signed)
Hypoglycemic Event  CBG: 53  Treatment: 25 grams 50% dextrose  Symptoms: tachycardic, agitated  Follow-up CBG: Time:0850 CBG Result:156  Possible Reasons for Event: insulin overdose  Comments/MD notified:MD aware    Wende Crease

## 2018-12-03 NOTE — Progress Notes (Signed)
Hypoglycemic Event  CBG: 49  Treatment: amp of D50  Symptoms: minimally responsive to pain  Follow-up CBG: Time:0735 CBG Result:135  Possible Reasons for Event: Insulin overdose  Comments/MD notified:Dr. Lowell Guitar at bedside.    Riley [name]

## 2018-12-03 NOTE — Progress Notes (Signed)
Hypoglycemic Event  CBG: 57  Treatment: 25grams 50% dextrose  Symptoms: tachycardic, anxious  Follow-up CBG: Time:1150 CBG Result:57  Possible Reasons for Event: insulin overdose  Comments/MD notified:MD aware, new orders placed.    Wende Crease

## 2018-12-03 NOTE — ED Notes (Signed)
BLADDER SCAN WAS 

## 2018-12-03 NOTE — ED Notes (Signed)
Critical Result of a blood glucose  Of 40 verified from Lab. PA Shari aware

## 2018-12-03 NOTE — Progress Notes (Signed)
Hypoglycemic Event  CBG: 67  Treatment: 25 grams 50% dextrose  Symptoms: Tachycardia, anxiety  Follow-up CBG: Time:1250 CBG Result:86  Possible Reasons for Event: Insulin overdose  Comments/MD notified:MD aware.    Wende Crease

## 2018-12-03 NOTE — Progress Notes (Signed)
Patient brought to unit at 0630. Blood sugar 33  taken at 0649. Started hypogylcemic protocol. Gave D50, blood sugar brought to 132 at 0703. Patient was restless and agitated- paged physician on call and verbal orders of 2mg  of ativan were given to RN. RN gave 2mg  of ativan and patient settled down and now only responds to pain. Day shift nurse and charge made aware. Will continue to monitor.

## 2018-12-03 NOTE — Progress Notes (Signed)
Hypoglycemic Event  CBG: 47  Treatment: 25 grams 50% dextrose  Symptoms: anxiety, increased HR.  Follow-up CBG: Time:1020 CBG Result:125  Possible Reasons for Event: insulin overdose  Comments/MD notified:MD aware of ongoing low blood sugars.    Riley [name]
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
## 2018-12-03 NOTE — Progress Notes (Signed)
eLink Physician-Brief Progress Note Patient Name: Riley SermonRobert G [name] DOB: 05-11-1991 MRN: [id number]   Date of Service  12/03/2018  HPI/Events of Note  A 28 year old male admitted to the ICU because of insulin overdose.  Blood pressure 165/125.  D10 infusing at 400 mL/h.   eICU Interventions  Ordered PRN IV labetalol for systolic blood pressure more than 180, as heart rate is in 120s.  No specific parameter for diastolic blood pressure.      Intervention Category Intermediate Interventions: Hypertension - evaluation and management;Medication change / dose adjustment;Communication with other healthcare providers and/or family  Riley [name] 12/03/2018, 9:32 PM
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
## 2018-12-03 NOTE — Progress Notes (Signed)
Hypoglycemic Event  CBG: 33  Treatment: D50 25g  Symptoms: hot/dry/nauseous  Follow-up CBG: Time: 704 CBG Result: 132  Possible Reasons for Event: insulin overdose  Comments/MD notified:MD notified    Gavan Nordby

## 2018-12-03 NOTE — Progress Notes (Addendum)
Pharmacy Antibiotic Note  Riley [name] is a 28 y.o. male with a h/o polysubstance abuse, suicide attempt, depression, and insulin overdose abuse admitted on 12/03/2018 with reported overdose on Novolin insulin, trospium, and paroxetine.  Pharmacy has been consulted for Zosyn dosing for possible sepsis.  Plan: Zosyn 3.375 g extended infusion q 8 hours.   F/U renal function, culture results, and plans for antibiotics     Temp (24hrs), Avg:99.1 F (37.3 C), Min:99.1 F (37.3 C), Max:99.1 F (37.3 C)  Recent Labs  Lab 12/03/18 0409  WBC 20.7*  CREATININE 0.78    CrCl cannot be calculated (Unknown ideal weight.).    No Known Allergies  Antimicrobials this admission: 2/12 Zosyn >>   Dose adjustments this admission:  Microbiology results: 2/12 BCx:  2/12 UCx:   2/12 MRSA PCR:   Thank you for allowing pharmacy to be a part of this patient's care.  Luisa Hart D 12/03/2018 8:13 AM

## 2018-12-04 DIAGNOSIS — T1491XA Suicide attempt, initial encounter: Secondary | ICD-10-CM

## 2018-12-04 DIAGNOSIS — T383X2D Poisoning by insulin and oral hypoglycemic [antidiabetic] drugs, intentional self-harm, subsequent encounter: Secondary | ICD-10-CM

## 2018-12-04 LAB — GLUCOSE, CAPILLARY
GLUCOSE-CAPILLARY: 135 mg/dL — AB (ref 70–99)
GLUCOSE-CAPILLARY: 144 mg/dL — AB (ref 70–99)
GLUCOSE-CAPILLARY: 136 mg/dL — AB (ref 70–99)
Glucose-Capillary: 103 mg/dL — ABNORMAL HIGH (ref 70–99)
Glucose-Capillary: 104 mg/dL — ABNORMAL HIGH (ref 70–99)
Glucose-Capillary: 104 mg/dL — ABNORMAL HIGH (ref 70–99)
Glucose-Capillary: 105 mg/dL — ABNORMAL HIGH (ref 70–99)
Glucose-Capillary: 112 mg/dL — ABNORMAL HIGH (ref 70–99)
Glucose-Capillary: 113 mg/dL — ABNORMAL HIGH (ref 70–99)
Glucose-Capillary: 116 mg/dL — ABNORMAL HIGH (ref 70–99)
Glucose-Capillary: 124 mg/dL — ABNORMAL HIGH (ref 70–99)
Glucose-Capillary: 125 mg/dL — ABNORMAL HIGH (ref 70–99)
Glucose-Capillary: 126 mg/dL — ABNORMAL HIGH (ref 70–99)
Glucose-Capillary: 126 mg/dL — ABNORMAL HIGH (ref 70–99)
Glucose-Capillary: 126 mg/dL — ABNORMAL HIGH (ref 70–99)
Glucose-Capillary: 128 mg/dL — ABNORMAL HIGH (ref 70–99)
Glucose-Capillary: 128 mg/dL — ABNORMAL HIGH (ref 70–99)
Glucose-Capillary: 130 mg/dL — ABNORMAL HIGH (ref 70–99)
Glucose-Capillary: 131 mg/dL — ABNORMAL HIGH (ref 70–99)
Glucose-Capillary: 136 mg/dL — ABNORMAL HIGH (ref 70–99)
Glucose-Capillary: 151 mg/dL — ABNORMAL HIGH (ref 70–99)
Glucose-Capillary: 159 mg/dL — ABNORMAL HIGH (ref 70–99)
Glucose-Capillary: 190 mg/dL — ABNORMAL HIGH (ref 70–99)
Glucose-Capillary: 88 mg/dL (ref 70–99)
Glucose-Capillary: 88 mg/dL (ref 70–99)
Glucose-Capillary: 95 mg/dL (ref 70–99)

## 2018-12-04 LAB — COMPREHENSIVE METABOLIC PANEL
ALT: 19 U/L (ref 0–44)
AST: 24 U/L (ref 15–41)
Albumin: 4 g/dL (ref 3.5–5.0)
Alkaline Phosphatase: 76 U/L (ref 38–126)
Anion gap: 6 (ref 5–15)
BUN: <5 mg/dL — ABNORMAL LOW (ref 6–20)
CALCIUM: 8.9 mg/dL (ref 8.9–10.3)
CHLORIDE: 104 mmol/L (ref 98–111)
CO2: 25 mmol/L (ref 22–32)
CREATININE: 0.83 mg/dL (ref 0.61–1.24)
GFR calc Af Amer: >60 mL/min (ref >60)
GFR calc non Af Amer: >60 mL/min (ref >60)
Glucose, Bld: 85 mg/dL (ref 70–99)
Potassium: 3.6 mmol/L (ref 3.5–5.1)
Sodium: 135 mmol/L (ref 135–145)
Total Bilirubin: 0.9 mg/dL (ref 0.3–1.2)
Total Protein: 7.3 g/dL (ref 6.5–8.1)

## 2018-12-04 LAB — URINE CULTURE: CULTURE: NO GROWTH

## 2018-12-04 LAB — CBC
HEMATOCRIT: 49.4 % (ref 39.0–52.0)
Hemoglobin: 16.5 g/dL (ref 13.0–17.0)
MCH: 32.6 pg (ref 26.0–34.0)
MCHC: 33.4 g/dL (ref 30.0–36.0)
MCV: 97.6 fL (ref 80.0–100.0)
PLATELETS: 294 10*3/uL (ref 150–400)
RBC: 5.06 MIL/uL (ref 4.22–5.81)
RDW: 12.6 % (ref 11.5–15.5)
WBC: 12.3 10*3/uL — ABNORMAL HIGH (ref 4.0–10.5)
nRBC: 0 % (ref 0.0–0.2)

## 2018-12-04 MED ORDER — TRAZODONE HCL 50 MG PO TABS
50.0000 mg | ORAL_TABLET | Freq: Every day | ORAL | Status: DC
  Administered 2018-12-04 – 2018-12-10 (×7): 50 mg via ORAL
  Filled 2018-12-04 (×7): qty 1

## 2018-12-04 MED ORDER — GABAPENTIN 300 MG PO CAPS
300.0000 mg | ORAL_CAPSULE | Freq: Three times a day (TID) | ORAL | Status: DC
  Administered 2018-12-04 – 2018-12-11 (×22): 300 mg via ORAL
  Filled 2018-12-04 (×22): qty 1

## 2018-12-04 NOTE — Progress Notes (Signed)
Updated MD on pt's cbg new orders received will continue to monitor.
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
## 2018-12-04 NOTE — Progress Notes (Signed)
Clamped foley per order. Instructed pt to make Tech in room aware when urge to void. Will continue to monitor.

## 2018-12-04 NOTE — Consult Note (Signed)
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  9:53 PM  Result Value Ref Range   Glucose-Capillary 111 (H) 70 - 99 mg/dL  Glucose, capillary      Status:  None   Collection Time: 12/03/18 10:08 PM  Result Value Ref Range   Glucose-Capillary 99 70 - 99 mg/dL  Glucose, capillary     Status: Abnormal   Collection Time: 12/03/18 10:32 PM  Result Value Ref Range   Glucose-Capillary 111 (H) 70 - 99 mg/dL  Glucose, capillary     Status: Abnormal   Collection Time: 12/03/18 11:03 PM  Result Value Ref Range   Glucose-Capillary 113 (H) 70 - 99 mg/dL  Glucose, capillary     Status: Abnormal   Collection Time: 12/03/18 11:34 PM  Result Value Ref Range   Glucose-Capillary 116 (H) 70 - 99 mg/dL  Glucose, capillary     Status: Abnormal   Collection Time: 12/04/18 12:01 AM  Result Value Ref Range   Glucose-Capillary 128 (H) 70 - 99 mg/dL  Glucose, capillary     Status: Abnormal   Collection Time: 12/04/18 12:32 AM  Result Value Ref Range   Glucose-Capillary 105 (H) 70 - 99 mg/dL  Glucose, capillary     Status: Abnormal   Collection Time: 12/04/18  1:03 AM  Result Value Ref Range   Glucose-Capillary 103 (H) 70 - 99 mg/dL   Comment 1 Notify RN    Comment 2 Document in Chart   Glucose, capillary     Status: Abnormal   Collection Time: 12/04/18  1:30 AM  Result Value Ref Range   Glucose-Capillary 113 (H) 70 - 99 mg/dL  Glucose, capillary     Status: Abnormal   Collection Time: 12/04/18  2:03 AM  Result Value Ref Range   Glucose-Capillary 159 (H) 70 - 99 mg/dL  Glucose, capillary     Status: Abnormal   Collection Time: 12/04/18  2:37 AM  Result Value Ref Range   Glucose-Capillary 112 (H) 70 - 99 mg/dL  Glucose, capillary     Status: None   Collection Time: 12/04/18  3:07 AM  Result Value Ref Range   Glucose-Capillary 95 70 - 99 mg/dL  Glucose, capillary     Status: Abnormal   Collection Time: 12/04/18  3:32 AM  Result Value Ref Range   Glucose-Capillary 126 (H) 70 - 99 mg/dL  Glucose, capillary     Status: Abnormal   Collection Time: 12/04/18  4:03 AM  Result Value Ref Range   Glucose-Capillary 116 (H) 70 - 99 mg/dL  Glucose,  capillary     Status: Abnormal   Collection Time: 12/04/18  4:32 AM  Result Value Ref Range   Glucose-Capillary 135 (H) 70 - 99 mg/dL  Glucose, capillary     Status: Abnormal   Collection Time: 12/04/18  5:01 AM  Result Value Ref Range   Glucose-Capillary 151 (H) 70 - 99 mg/dL  Glucose, capillary     Status: Abnormal   Collection Time: 12/04/18  5:36 AM  Result Value Ref Range   Glucose-Capillary 128 (H) 70 - 99 mg/dL  Glucose, capillary     Status: None   Collection Time: 12/04/18  6:03 AM  Result Value Ref Range   Glucose-Capillary 88 70 - 99 mg/dL  Comprehensive metabolic panel     Status: Abnormal   Collection Time: 12/04/18  6:28 AM  Result Value Ref Range   Sodium 135 135 - 145 mmol/L   Potassium 3.6 3.5 - 5.1 mmol/L   Chloride 104 98 - 111 mmol/L   CO2 25 22 - 32 mmol/L   Glucose, Bld 85 70 - 99 mg/dL   BUN <5 (L) 6 - 20 mg/dL  Creatinine, Ser 0.83 0.61 - 1.24 mg/dL   Calcium 8.9 8.9 - 21.3 mg/dL   Total Protein 7.3 6.5 - 8.1 g/dL   Albumin 4.0 3.5 - 5.0 g/dL   AST 24 15 - 41 U/L   ALT 19 0 - 44 U/L   Alkaline Phosphatase 76 38 - 126 U/L   Total Bilirubin 0.9 0.3 - 1.2 mg/dL   GFR calc non Af Amer >60 >60 mL/min   GFR calc Af Amer >60 >60 mL/min   Anion gap 6 5 - 15    Comment: Performed at [facility], [address], [place], [place] [address]  CBC     Status: Abnormal   Collection Time: 12/04/18  6:28 AM  Result Value Ref Range   WBC 12.3 (H) 4.0 - 10.5 K/uL   RBC 5.06 4.22 - 5.81 MIL/uL   Hemoglobin 16.5 13.0 - 17.0 g/dL   HCT 84.6 96.2 - 95.2 %   MCV 97.6 80.0 - 100.0 fL   MCH 32.6 26.0 - 34.0 pg   MCHC 33.4 30.0 - 36.0 g/dL   RDW 84.1 32.4 - 40.1 %   Platelets 294 150 - 400 K/uL   nRBC 0.0 0.0 - 0.2 %    Comment: Performed at [facility], [address], [place], [place] [address]  Glucose, capillary     Status: None   Collection Time: 12/04/18  6:28 AM  Result Value Ref Range   Glucose-Capillary 88 70 - 99  mg/dL  Glucose, capillary     Status: Abnormal   Collection Time: 12/04/18  7:09 AM  Result Value Ref Range   Glucose-Capillary 104 (H) 70 - 99 mg/dL  Glucose, capillary     Status: Abnormal   Collection Time: 12/04/18  7:34 AM  Result Value Ref Range   Glucose-Capillary 104 (H) 70 - 99 mg/dL  Glucose, capillary     Status: Abnormal   Collection Time: 12/04/18  8:04 AM  Result Value Ref Range   Glucose-Capillary 130 (H) 70 - 99 mg/dL  Glucose, capillary     Status: Abnormal   Collection Time: 12/04/18  8:35 AM  Result Value Ref Range   Glucose-Capillary 125 (H) 70 - 99 mg/dL  Glucose, capillary     Status: Abnormal   Collection Time: 12/04/18  9:04 AM  Result Value Ref Range   Glucose-Capillary 124 (H) 70 - 99 mg/dL  Glucose, capillary     Status: Abnormal   Collection Time: 12/04/18  9:34 AM  Result Value Ref Range   Glucose-Capillary 126 (H) 70 - 99 mg/dL  Glucose, capillary     Status: Abnormal   Collection Time: 12/04/18 10:04 AM  Result Value Ref Range   Glucose-Capillary 144 (H) 70 - 99 mg/dL  Glucose, capillary     Status: Abnormal   Collection Time: 12/04/18 12:11 PM  Result Value Ref Range   Glucose-Capillary 190 (H) 70 - 99 mg/dL    Current Facility-Administered Medications  Medication Dose Route Frequency Provider Last Rate Last Dose  . chlordiazePOXIDE (LIBRIUM) capsule 25 mg  25 mg Oral Q6H PRN Zigmund Daniel., MD   25 mg at 12/03/18 1236  . chlordiazePOXIDE (LIBRIUM) capsule 25 mg  25 mg Oral TID Zigmund Daniel., MD   25 mg at 12/04/18 3664   Followed by  . [START ON 12/05/2018] chlordiazePOXIDE (LIBRIUM) capsule 25 mg  25 mg Oral BH-qamhs Zigmund Daniel., MD  Followed by  . [START ON 12/06/2018] chlordiazePOXIDE (LIBRIUM) capsule 25 mg  25 mg Oral Daily Zigmund DanielPowell, A Caldwell Jr., MD      . dextrose 10 % infusion   Intravenous Continuous Rhetta MuraSamtani, Jai-Gurmukh, MD 50 mL/hr at 12/04/18 1317    . hydrOXYzine (ATARAX/VISTARIL) tablet 25 mg  25  mg Oral Q6H PRN Zigmund DanielPowell, A Caldwell Jr., MD      . labetalol (NORMODYNE,TRANDATE) injection 10 mg  10 mg Intravenous Q4H PRN Glory RosebushSingasani, Reddy S, MD      . loperamide (IMODIUM) capsule 2-4 mg  2-4 mg Oral PRN Zigmund DanielPowell, A Caldwell Jr., MD      . multivitamin with minerals tablet 1 tablet  1 tablet Oral Daily Zigmund DanielPowell, A Caldwell Jr., MD   1 tablet at 12/04/18 [phone] [id number]  . ondansetron (ZOFRAN-ODT) disintegrating tablet 4 mg  4 mg Oral Q6H PRN Zigmund DanielPowell, A Caldwell Jr., MD      . piperacillin-tazobactam (ZOSYN) IVPB 3.375 g  3.375 g Intravenous Q8H Zigmund DanielPowell, A Caldwell Jr., MD   Stopped at 12/04/18 1304  . thiamine (VITAMIN B-1) tablet 100 mg  100 mg Oral Daily Zigmund DanielPowell, A Caldwell Jr., MD   100 mg at 12/04/18 [id number]    Musculoskeletal: Strength & Muscle Tone: within normal limits Gait & Station: UTA since lying in bed. Patient leans: N/A  Psychiatric Specialty Exam: Physical Exam  Nursing note and vitals reviewed. Constitutional: He is oriented to person, place, and time. He appears well-developed and well-nourished.  HENT:  Head: Normocephalic and atraumatic.  Neck: Normal range of motion.  Respiratory: Effort normal.  Musculoskeletal: Normal range of motion.  Neurological: He is alert and oriented to person, place, and time.  Psychiatric: His speech is normal and behavior is normal. Judgment and thought content normal. Cognition and memory are normal. He exhibits a depressed mood.    Review of Systems  Psychiatric/Behavioral: Positive for depression and substance abuse. Negative for hallucinations and suicidal ideas. The patient has insomnia.   All other systems reviewed and are negative.   Blood pressure (!) 151/117, pulse 90, temperature 98.8 F (37.1 C), resp. rate 16, height 5\' 7"  (1.702 m), weight 70 kg, SpO2 97 %.Body mass index is 24.17 kg/m.  General Appearance: Fairly Groomed, young, Caucasian male, wearing a hospital gown with a shaved haircut who is lying in bed. NAD.   Eye Contact:  Good   Speech:  Clear and Coherent and Normal Rate  Volume:  Normal  Mood:  Depressed  Affect:  Congruent  Thought Process:  Goal Directed, Linear and Descriptions of Associations: Intact  Orientation:  Full (Time, Place, and Person)  Thought Content:  Logical  Suicidal Thoughts:  No  Homicidal Thoughts:  No  Memory:  Immediate;   Good Recent;   Good Remote;   Good  Judgement:  Fair  Insight:  Fair  Psychomotor Activity:  Normal  Concentration:  Concentration: Good and Attention Span: Good  Recall:  Good  Fund of Knowledge:  Good  Language:  Good  Akathisia:  No  Handed:  Right  AIMS (if indicated):   N/A  Assets:  Communication Skills Desire for Improvement Housing Physical Health Resilience Social Support  ADL's:  Intact  Cognition:  WNL  Sleep:   Poor   Assessment:  Riley [name] is a 28 y.o. male who was admitted with insulin overdose. He endorses depressed mood in the setting of psychosocial stressors. He also reports chronic alcohol abuse. He warrants inpatient psychiatric hospitalization for stabilization and  treatment.    Treatment Plan Summary: -Patient warrants inpatient psychiatric hospitalization given high risk of harm to self. -Continue Software engineer.  -Start Trazodone 50 mg qhs PRN for insomnia.  -Start Gabapentin 300 mg TID for anxiety and alcohol use/withdrawal.  -EKG reviewed and QTc 470 on 2/12. Please closely monitor when starting or increasing QTc prolonging agents.  -Please pursue involuntary commitment if patient refuses voluntary psychiatric hospitalization or attempts to leave the hospital.  -Will sign off on patient at this time. Please consult psychiatry again as needed.     Disposition: Recommend psychiatric Inpatient admission when medically cleared.  Cherly Beach, DO 12/04/2018 1:18 PM

## 2018-12-04 NOTE — Progress Notes (Signed)
[facility] HOSPITALIST PROGRESS NOTE  Riley [name] [id number] DOB: 02-25-1991 DOA: 12/03/2018 PCP: Patient, No Pcp Per   Narrative: 28 year old Caucasian male Prior ethanol abuse Bipolar Multiple suicidal attempts-last admitted 05/2017 with overdose of possibly methadone Prior admissions to behavioral health 11/2017 as well as 12/2017 for suicidality  Admitted from [facility], ED 2/12 with toxic metabolic encephalopathy likely secondary to overdose of 750 units of insulin, proximal team, trust.  Was hypoglycemic on admission started on D50, CCM consulted given airway concerns  A & Plan Acute suicidality with overdose of insulin tropium paroxetine  QTC seems benign he is slightly tachycardic Sugars have been consistently above 80 so changing from every 30 minutes to every 2 hourly and then 4 times daily AC at bedtime Not hungry so keep D5 water running Psychiatry consulted for further input will probably need psychiatric inpatient admission we will alert social worker Sirs No sepsis physiology per se likely component of anxiety and physiologic stress Monitor fever curve Zosyn for now but likely de-escalate if cultures are negative--would get x-ray in a.m. given possible aspiration risk because of metabolic encephalopathy Ethanol abuse Continue with Librium protocol CIWA scores are ranging from 1-7 Hypokalemia Resolved now 3.6 monitor Urinary retention Clamping Foley later today to see if he is able to void Bipolar See above discussion   Lovenox, full code, inpatient on stepdown unit no family present  Mahala Menghini, MD  [facility] Via [employer] app OR [email or web address] 7PM-7AM contact night coverage as above 12/04/2018, 8:02 AM  LOS: 0 days   Consultants:  CCM  Psychiatry  Procedures:  None  Antimicrobials:  No  Interval history/Subjective: Awake but falls asleep promptly after discussion Is able to orient but does not know what exactly he took in terms of  medication Obtain insulin from friend of his  Objective:  Vitals:  Vitals:   12/04/18 0600 12/04/18 0601  BP: (!) 159/122   Pulse: 97 (!) 102  Resp: (!) 28 (!) 27  Temp: 99.1 F (37.3 C) 97.7 F (36.5 C)  SpO2: 99% 100%    Exam:  EOMI looking about stated age no icterus no pallor slightly sleepy very dry mucosa no submandibular lymphadenopathy Abdomen soft no rebound no guarding Chest is clear Extremities are soft nontender   I have personally reviewed the following:  DATA   Labs:  Chem-12 is benign, WBC is 12.3   Scheduled Meds: . chlordiazePOXIDE  25 mg Oral TID   Followed by  . [START ON 12/05/2018] chlordiazePOXIDE  25 mg Oral BH-qamhs   Followed by  . [START ON 12/06/2018] chlordiazePOXIDE  25 mg Oral Daily  . multivitamin with minerals  1 tablet Oral Daily  . thiamine  100 mg Oral Daily   Continuous Infusions: . dextrose 200 mL/hr at 12/04/18 0527  . piperacillin-tazobactam (ZOSYN)  IV Stopped (12/04/18 0507)    Active Problems:   Insulin overdose   Overdose   Tachypnea   LOS: 0 days

## 2018-12-04 NOTE — Progress Notes (Signed)
PCCM Brief Note  Riley [name] is more awake today, no evidence of withdrawal at this time. His CBG's are at goal and rate D10 has been decreased to 200cc/h  He has admitted to RN that he took meds intentionally to hurt himself. Sitter is at bedside - has not been seen by psych yet, but likely has the MS now to do so.   No airway protection issues now. We will sign off. Call if we can help.    Levy Pupa, MD, PhD 12/04/2018, 9:26 AM [facility] [phone] or if no answer [phone]

## 2018-12-05 ENCOUNTER — Inpatient Hospital Stay (HOSPITAL_COMMUNITY): Payer: Self-pay

## 2018-12-05 LAB — GLUCOSE, CAPILLARY
GLUCOSE-CAPILLARY: 114 mg/dL — AB (ref 70–99)
Glucose-Capillary: 100 mg/dL — ABNORMAL HIGH (ref 70–99)
Glucose-Capillary: 111 mg/dL — ABNORMAL HIGH (ref 70–99)
Glucose-Capillary: 124 mg/dL — ABNORMAL HIGH (ref 70–99)
Glucose-Capillary: 125 mg/dL — ABNORMAL HIGH (ref 70–99)
Glucose-Capillary: 128 mg/dL — ABNORMAL HIGH (ref 70–99)
Glucose-Capillary: 130 mg/dL — ABNORMAL HIGH (ref 70–99)
Glucose-Capillary: 134 mg/dL — ABNORMAL HIGH (ref 70–99)
Glucose-Capillary: 136 mg/dL — ABNORMAL HIGH (ref 70–99)
Glucose-Capillary: 137 mg/dL — ABNORMAL HIGH (ref 70–99)
Glucose-Capillary: 157 mg/dL — ABNORMAL HIGH (ref 70–99)
Glucose-Capillary: 78 mg/dL (ref 70–99)
Glucose-Capillary: 97 mg/dL (ref 70–99)

## 2018-12-05 LAB — BASIC METABOLIC PANEL
Anion gap: 10 (ref 5–15)
BUN: 5 mg/dL — ABNORMAL LOW (ref 6–20)
CO2: 24 mmol/L (ref 22–32)
Calcium: 8.8 mg/dL — ABNORMAL LOW (ref 8.9–10.3)
Chloride: 100 mmol/L (ref 98–111)
Creatinine, Ser: 0.93 mg/dL (ref 0.61–1.24)
GFR calc Af Amer: >60 mL/min (ref >60)
GFR calc non Af Amer: >60 mL/min (ref >60)
Glucose, Bld: 132 mg/dL — ABNORMAL HIGH (ref 70–99)
POTASSIUM: 3.3 mmol/L — AB (ref 3.5–5.1)
Sodium: 134 mmol/L — ABNORMAL LOW (ref 135–145)

## 2018-12-05 LAB — CBC WITH DIFFERENTIAL/PLATELET
ABS IMMATURE GRANULOCYTES: 0.03 10*3/uL (ref 0.00–0.07)
Basophils Absolute: 0.1 10*3/uL (ref 0.0–0.1)
Basophils Relative: 1 %
Eosinophils Absolute: 0.3 10*3/uL (ref 0.0–0.5)
Eosinophils Relative: 3 %
HCT: 50.8 % (ref 39.0–52.0)
Hemoglobin: 17 g/dL (ref 13.0–17.0)
Immature Granulocytes: 0 %
Lymphocytes Relative: 35 %
Lymphs Abs: 3.2 10*3/uL (ref 0.7–4.0)
MCH: 33.1 pg (ref 26.0–34.0)
MCHC: 33.5 g/dL (ref 30.0–36.0)
MCV: 98.8 fL (ref 80.0–100.0)
Monocytes Absolute: 0.9 10*3/uL (ref 0.1–1.0)
Monocytes Relative: 10 %
Neutro Abs: 4.6 10*3/uL (ref 1.7–7.7)
Neutrophils Relative %: 51 %
Platelets: 295 10*3/uL (ref 150–400)
RBC: 5.14 MIL/uL (ref 4.22–5.81)
RDW: 12.8 % (ref 11.5–15.5)
WBC: 9 10*3/uL (ref 4.0–10.5)
nRBC: 0 % (ref 0.0–0.2)

## 2018-12-05 MED ORDER — POTASSIUM CHLORIDE CRYS ER 20 MEQ PO TBCR
40.0000 meq | EXTENDED_RELEASE_TABLET | Freq: Every day | ORAL | Status: DC
  Administered 2018-12-05 – 2018-12-11 (×7): 40 meq via ORAL
  Filled 2018-12-05 (×7): qty 2

## 2018-12-05 MED ORDER — ENOXAPARIN SODIUM 40 MG/0.4ML ~~LOC~~ SOLN
40.0000 mg | SUBCUTANEOUS | Status: DC
  Administered 2018-12-05 – 2018-12-11 (×7): 40 mg via SUBCUTANEOUS
  Filled 2018-12-05 (×7): qty 0.4

## 2018-12-05 MED ORDER — POTASSIUM CHLORIDE CRYS ER 20 MEQ PO TBCR
20.0000 meq | EXTENDED_RELEASE_TABLET | Freq: Once | ORAL | Status: AC
  Administered 2018-12-05: 20 meq via ORAL
  Filled 2018-12-05: qty 1

## 2018-12-05 NOTE — Progress Notes (Signed)
[facility] HOSPITALIST PROGRESS NOTE  Riley [name] QRF:[id number] DOB: 1991/04/08 DOA: 12/03/2018 PCP: Patient, No Pcp Per   Narrative: 28 year old Caucasian male Prior ethanol abuse Bipolar Multiple suicidal attempts-last admitted 05/2017 with overdose of possibly methadone Prior admissions to behavioral health 11/2017 as well as 12/2017 for suicidality  Admitted from [facility], ED 2/12 with toxic metabolic encephalopathy likely secondary to overdose of 750 units of insulin, proximal team, trust.  Was hypoglycemic on admission started on D50, CCM consulted given airway concerns  A & Plan Acute suicidality with overdose of insulin tropium paroxetine  QTC looks benign keep D5 water running 50 cc/h Psychiatry inpjut appreciated-added Gabapeneitn 300 tid and trazodone 50 hs sleep Needs IP psych facility--will alert CSW Sirs No sepsis physiology per se likely component of anxiety and physiologic stress D/c Zosyn given neg work up Ethanol abuse Continue with Librium protocol CIWA scores are ranging from 4-7 Likely can de-escaalte Hypokalemia repalce with Kdur rpt am labs Urinary retention Clamping Foley and patient now voiding spont Bipolar See above discussion   Lovenox, full code, inpatient on stepdown unit no family present  Mahala Menghini, MD  [facility] Via [employer] app OR [email or web address] 7PM-7AM contact night coverage as above 12/05/2018, 12:41 PM  LOS: 1 day   Consultants:  CCM  Psychiatry  Procedures:  None  Antimicrobials:  No  Interval history/Subjective:  Awakens not hungry but promises to eat No fever no chills no sputum   Objective:  Vitals:  Vitals:   12/05/18 0800 12/05/18 0900  BP: 106/71 108/78  Pulse: 86 93  Resp: 20 20  Temp:    SpO2: 96% 97%    Exam:  EOMI ncat no dsitress s1 s2 slight tachy Abdomen soft no rebound no guarding Chest is clear Extremities are soft nontender   I have personally reviewed the following:   DATA   Labs:  k 3.3  WBC is 12.3-->9   Scheduled Meds: . chlordiazePOXIDE  25 mg Oral BH-qamhs   Followed by  . [START ON 12/06/2018] chlordiazePOXIDE  25 mg Oral Daily  . gabapentin  300 mg Oral TID  . multivitamin with minerals  1 tablet Oral Daily  . potassium chloride  40 mEq Oral Daily  . thiamine  100 mg Oral Daily  . traZODone  50 mg Oral QHS   Continuous Infusions: . dextrose 50 mL/hr at 12/05/18 0900    Principal Problem:   Suicide attempt [facility]) Active Problems:   Insulin overdose   Overdose   Tachypnea   LOS: 1 day

## 2018-12-05 NOTE — Progress Notes (Signed)
Pt c/o difficulty urinating.  Assisted patient up to the commode and he was able to urinate.    Pt refusing to eat anything.  Saying that he doesn't have an appetite.

## 2018-12-05 NOTE — Progress Notes (Signed)
Patient was transferred from ICU. Alert and oriented x4. No pain complained. Sitter at bedside for SI. Vital signs was taken. Room is set up, call light is within patient's reach. Patient has driver license that was sent to security and the key is in patient's chart.

## 2018-12-06 LAB — CBC WITH DIFFERENTIAL/PLATELET
Abs Immature Granulocytes: 0.01 10*3/uL (ref 0.00–0.07)
Basophils Absolute: 0.1 10*3/uL (ref 0.0–0.1)
Basophils Relative: 1 %
Eosinophils Absolute: 0.3 10*3/uL (ref 0.0–0.5)
Eosinophils Relative: 5 %
HCT: 49.8 % (ref 39.0–52.0)
HEMOGLOBIN: 16.6 g/dL (ref 13.0–17.0)
Immature Granulocytes: 0 %
Lymphocytes Relative: 42 %
Lymphs Abs: 2.7 10*3/uL (ref 0.7–4.0)
MCH: 32.5 pg (ref 26.0–34.0)
MCHC: 33.3 g/dL (ref 30.0–36.0)
MCV: 97.6 fL (ref 80.0–100.0)
MONO ABS: 0.5 10*3/uL (ref 0.1–1.0)
Monocytes Relative: 8 %
Neutro Abs: 2.9 10*3/uL (ref 1.7–7.7)
Neutrophils Relative %: 44 %
Platelets: 272 10*3/uL (ref 150–400)
RBC: 5.1 MIL/uL (ref 4.22–5.81)
RDW: 12.6 % (ref 11.5–15.5)
WBC: 6.5 10*3/uL (ref 4.0–10.5)
nRBC: 0 % (ref 0.0–0.2)

## 2018-12-06 LAB — RENAL FUNCTION PANEL
Albumin: 3.5 g/dL (ref 3.5–5.0)
Anion gap: 8 (ref 5–15)
BUN: 8 mg/dL (ref 6–20)
CO2: 27 mmol/L (ref 22–32)
Calcium: 8.6 mg/dL — ABNORMAL LOW (ref 8.9–10.3)
Chloride: 101 mmol/L (ref 98–111)
Creatinine, Ser: 0.89 mg/dL (ref 0.61–1.24)
GFR calc Af Amer: >60 mL/min (ref >60)
GFR calc non Af Amer: >60 mL/min (ref >60)
Glucose, Bld: 100 mg/dL — ABNORMAL HIGH (ref 70–99)
Phosphorus: 3.5 mg/dL (ref 2.5–4.6)
Potassium: 3.4 mmol/L — ABNORMAL LOW (ref 3.5–5.1)
Sodium: 136 mmol/L (ref 135–145)

## 2018-12-06 LAB — GLUCOSE, CAPILLARY
GLUCOSE-CAPILLARY: 105 mg/dL — AB (ref 70–99)
Glucose-Capillary: 106 mg/dL — ABNORMAL HIGH (ref 70–99)
Glucose-Capillary: 120 mg/dL — ABNORMAL HIGH (ref 70–99)
Glucose-Capillary: 121 mg/dL — ABNORMAL HIGH (ref 70–99)

## 2018-12-06 MED ORDER — HYDROXYZINE HCL 25 MG PO TABS
25.0000 mg | ORAL_TABLET | Freq: Three times a day (TID) | ORAL | Status: AC | PRN
  Administered 2018-12-06 – 2018-12-08 (×3): 25 mg via ORAL
  Filled 2018-12-06 (×3): qty 1

## 2018-12-06 MED ORDER — HYDROXYZINE HCL 25 MG PO TABS: 25.0000 mg | ORAL_TABLET | Freq: Three times a day (TID) | ORAL | Status: DC | PRN

## 2018-12-06 MED ORDER — CHLORDIAZEPOXIDE HCL 5 MG PO CAPS
10.0000 mg | ORAL_CAPSULE | Freq: Three times a day (TID) | ORAL | Status: DC | PRN
  Administered 2018-12-06 – 2018-12-07 (×2): 10 mg via ORAL
  Filled 2018-12-06 (×2): qty 2

## 2018-12-06 NOTE — Progress Notes (Signed)
[facility] HOSPITALIST PROGRESS NOTE  Riley [name] [id number] DOB: 1991-06-25 DOA: 12/03/2018 PCP: Patient, No Pcp Per   Narrative: 28 year old Caucasian male Prior ethanol abuse Bipolar Multiple suicidal attempts-last admitted 05/2017 with overdose of possibly methadone Prior admissions to behavioral health 11/2017 as well as 12/2017 for suicidality  Admitted from [facility], ED 2/12 with toxic metabolic encephalopathy likely secondary to overdose of 750 units of insulin, proximal team, trust.  Was hypoglycemic on admission started on D50, CCM consulted given airway concerns  A & Plan Acute suicidality with overdose of insulin tropium paroxetine  QTC less than 0.4, d/c tele Saline lock d5 Psychiatry added Gabapeneitn 300 tid and trazodone 50 hs sleep Await IP pscyh facility aware Sirs No sepsis physiology per se likely component of anxiety and physiologic stress D/c Zosyn given neg work up Ethanol abuse Continue with Librium protocol CIWA scores are ranging from 4-7 Likely can de-escalate in am and discotninjue protocol Hypokalemia Low but improved-need am labs Urinary retention resolved Bipolar See above discussion   Lovenox, full code, inpatient on stepdown unit no family present  Mahala Menghini, MD  [facility] [employer] app OR [email or web address] 7PM-7AM contact night coverage as above 12/06/2018, 11:42 AM  LOS: 2 days   Consultants:  CCM  Psychiatry  Procedures:  None  Antimicrobials:  No  Interval history/Subjective:  Eating some but bedbound as per usual No distress coherent and alert  Objective:  Vitals:  Vitals:   12/05/18 2017 12/06/18 0700  BP: (!) 119/94 118/78  Pulse: 73 66  Resp: 16 18  Temp: 98.1 F (36.7 C) 97.6 F (36.4 C)  SpO2: 98% 98%    Exam:  EOMI ncat no dsitress s1 s2 slight tachy Abdomen soft no rebound no guarding Chest is clear Extremities are soft nontender   I have personally reviewed the following:   DATA   Labs:  k 3.3-->3.4  WBC is 12.3-->9-->6.5   Scheduled Meds: . enoxaparin (LOVENOX) injection  40 mg Subcutaneous Q24H  . gabapentin  300 mg Oral TID  . multivitamin with minerals  1 tablet Oral Daily  . potassium chloride  40 mEq Oral Daily  . thiamine  100 mg Oral Daily  . traZODone  50 mg Oral QHS   Continuous Infusions:   Principal Problem:   Suicide attempt [facility]) Active Problems:   Insulin overdose   Overdose   Tachypnea   LOS: 2 days

## 2018-12-07 DIAGNOSIS — T50902A Poisoning by unspecified drugs, medicaments and biological substances, intentional self-harm, initial encounter: Secondary | ICD-10-CM

## 2018-12-07 LAB — RENAL FUNCTION PANEL
Albumin: 3.4 g/dL — ABNORMAL LOW (ref 3.5–5.0)
Anion gap: 8 (ref 5–15)
BUN: 11 mg/dL (ref 6–20)
CALCIUM: 8.7 mg/dL — AB (ref 8.9–10.3)
CO2: 28 mmol/L (ref 22–32)
CREATININE: 0.99 mg/dL (ref 0.61–1.24)
Chloride: 100 mmol/L (ref 98–111)
GFR calc Af Amer: >60 mL/min (ref >60)
GFR calc non Af Amer: >60 mL/min (ref >60)
Glucose, Bld: 107 mg/dL — ABNORMAL HIGH (ref 70–99)
Phosphorus: 3.6 mg/dL (ref 2.5–4.6)
Potassium: 3.6 mmol/L (ref 3.5–5.1)
Sodium: 136 mmol/L (ref 135–145)

## 2018-12-07 NOTE — Progress Notes (Signed)
[facility] HOSPITALIST PROGRESS NOTE  ZOHAR JORE [id number] DOB: [birth year] DOA: 12/03/2018 PCP: Patient, No Pcp Per   Narrative: 28 year old Caucasian male Prior ethanol abuse Bipolar Multiple suicidal attempts-last admitted 05/2017 with overdose of possibly methadone Prior admissions to behavioral health 11/2017 as well as 12/2017 for suicidality  Admitted from [facility], ED 2/12 with toxic metabolic encephalopathy likely secondary to overdose of 750 units of insulin, proximal team, trust.  Was hypoglycemic on admission started on D50, CCM consulted given airway concerns  A & Plan Acute suicidality with overdose of insulin tropium paroxetine Now outside acute toxic ranges, QTC stabilized when last chekced Saline lock d5 Psychiatry added Guaiphenesin 300 tid and trazodone 50 hs sleep Await IP pscyh facility aware--he mentioned he "will try something other than insulin next time" to nursing although to me he declines SI Sirs No sepsis physiology per se likely component of anxiety and physiologic stress D/c Zosyn given neg work up Ethanol abuse stop Librium protocol CIWA scores are 1-4, see below Hypokalemia imrpoved-intermittent labs Urinary retention resolved Bipolar See above discussion Added back low dose librium 10 tid and atarax 25 tid prn Will nee dongoing manageemnt per Psych   Lovenox, full code, inpatient on stepdown unit no family present  Mahala Menghini, MD  [facility] [employer] app OR [email or web address] 7PM-7AM contact night coverage as above 12/07/2018, 11:17 AM  LOS: 3 days   Consultants:  CCM  Psychiatry  Procedures:  None  Antimicrobials:  No  Interval history/Subjective:  remains in bed Eating more rec'd call from nurse yesterday-patient shaky and slightly tremulous Overall he is about the same  Objective:  Vitals:  Vitals:   12/06/18 2041 12/07/18 0549  BP: 126/88 119/84  Pulse: 76 64  Resp: 14 17  Temp: 98.4 F (36.9 C) 98 F  (36.7 C)  SpO2: 94% 97%    Exam:  EOMI ncat no distress s1 s2 RRR Abdomen soft no rebound no guarding Chest is clear Extremities are soft nontender   I have personally reviewed the following:  DATA   Labs:  k 3.3-->3.4-->3.6   Scheduled Meds: . enoxaparin (LOVENOX) injection  40 mg Subcutaneous Q24H  . gabapentin  300 mg Oral TID  . multivitamin with minerals  1 tablet Oral Daily  . potassium chloride  40 mEq Oral Daily  . thiamine  100 mg Oral Daily  . traZODone  50 mg Oral QHS   Continuous Infusions:   Principal Problem:   Suicide attempt [facility]) Active Problems:   Insulin overdose   Overdose   Tachypnea   LOS: 3 days

## 2018-12-08 LAB — CULTURE, BLOOD (ROUTINE X 2)
Culture: NO GROWTH
Culture: NO GROWTH
Special Requests: ADEQUATE
Special Requests: ADEQUATE

## 2018-12-08 MED ORDER — CHLORDIAZEPOXIDE HCL 5 MG PO CAPS: 10.0000 mg | ORAL_CAPSULE | Freq: Two times a day (BID) | ORAL | Status: DC | PRN

## 2018-12-08 NOTE — Clinical Social Work Note (Signed)
Clinical Social Work Assessment  Patient Details  Name: Riley [name] MRN: [id number] Date of Birth: 1991/10/08  Date of referral:  12/08/18               Reason for consult:  Facility Placement, Mental Health Concerns                Permission sought to share information with:  Facility Industrial/product designer granted to share information::     Name::        Agency::  Inpatient psych  Relationship::     Contact Information:     Housing/Transportation Living arrangements for the past 2 months:  Single Family Home Source of Information:  Patient Patient Interpreter Needed:  None Criminal Activity/Legal Involvement Pertinent to Current Situation/Hospitalization:  No - Comment as needed Significant Relationships:    Lives with:    Do you feel safe going back to the place where you live?    Need for family participation in patient care:  No (Coment)  Care giving concerns:  [name] is a 28 y.o. male with medical history significant of suicide attempts, depression, insulin overdose, alcohol overdose who presented in the setting of an overdose with insulin, trospium, and paroxetine.   Patient expressed concerns regarding housing at dc. Patient is concerned roommate will not allow him back since he attempted suicide in his house. Patient lacks support and has hx of suicide attempts.    Social Worker assessment / plan:  LCSW following for inpatient psych placement.  According to notes patient wears an ankle monitor. This limits his facility choices to in county facilities only.   Patient reports that he is unemployed and has no source of income. Patient currently lives with a friend. Patient is uninsured.  According to psych note patient was incarcerated from [age] and wears an ankle monitor. Note states that he is a registered sex offender. All are barriers to patient securing housing and employment.   PLAN: Inpatient psych when medically stable. Patient is voluntary  at the time of assessment.   Employment status:  Unemployed Health and safety inspector:  Self Pay (Medicaid Pending) PT Recommendations:  Not assessed at this time Information / Referral to community resources:     Patient/Family's Response to care:  Patient is thankful for LCSW coordination with dc planning.   Patient/Family's Understanding of and Emotional Response to Diagnosis, Current Treatment, and Prognosis:  Patient expressed concerns about housing wants dc from psych. Patient asked questions regarding estimated dc time. Patient is realistic about his situation and voluntary at the time of assessment.   Emotional Assessment Appearance:  Appears stated age Attitude/Demeanor/Rapport:  Engaged Affect (typically observed):  Overwhelmed, Depressed Orientation:  Oriented to Place, Oriented to Self, Oriented to  Time, Oriented to Situation Alcohol / Substance use:  Alcohol Use, Illicit Drugs Psych involvement (Current and /or in the community):  No (Comment)  Discharge Needs  Concerns to be addressed:  Homelessness, Mental Health Concerns Readmission within the last 30 days:  No Current discharge risk:  None Barriers to Discharge:  Continued Medical Work up, Psych Bed not available   Coralyn Helling, LCSW 12/08/2018, 10:09 AM

## 2018-12-08 NOTE — Progress Notes (Addendum)
Pt expressing SI w/ RN. Asking questions "How close was I to ending it?" Stating "If I had been found later I'd be dead." Pt also stated that in the past at [facility] "I said what I had to say to get out of there." Pt also stated that "I got the vibe I was being pushed to get out of there" referring to [facility].

## 2018-12-08 NOTE — Progress Notes (Signed)
[facility] PROGRESS NOTE  Riley [name] RPR:[id number] DOB: [birth year] DOA: 12/03/2018 PCP: Patient, No Pcp Per   Narrative: 28 year old Caucasian male Prior ethanol abuse Bipolar Multiple suicidal attempts-last admitted 05/2017 with overdose of possibly methadone Prior admissions to behavioral health 11/2017 as well as 12/2017 for suicidality  Admitted from [facility], ED 2/12 with toxic metabolic encephalopathy likely secondary to overdose of 750 units of insulin, proximal team, trust.  Was hypoglycemic on admission started on D50, CCM consulted given airway concerns  A & Plan Acute suicidality with overdose of insulin tropium paroxetine Now outside acute toxic ranges, QTC stabilized Psychiatry added Guaiphenesin 300 tid and trazodone 50 hs sleep Await IP pscyh facility--CSW aware-He is medically stable for transfer at any time to IP psychiatric facility Sirs No sepsis physiology per se likely component of anxiety and physiologic stress D/c Zosyn given neg work up Ethanol abuse stop Librium protocol 2/15 Hypokalemia imrpoved-intermittent labs Urinary retention resolved Bipolar See above discussion decrease librium 10 tid -->bid and then stop in 48 hrand atarax 25 tid prn Will nee dongoing manageemnt per Psych   Lovenox, full code,  He is stable for transfer at any time to [facility] or psychiatric faciltiy  Mahala Menghini, MD  [facility] Via [employer] app OR -[email or web address] 7PM-7AM contact night coverage as above 12/08/2018, 11:37 AM  LOS: 4 days   Consultants:  CCM  Psychiatry  Procedures:  None  Antimicrobials:  No  Interval history/Subjective:  Awake alert pleasant in nad eating more ambulated yesterday Very calm  No voices nor hallucinations  Objective:  Vitals:  Vitals:   12/07/18 0549 12/07/18 2148  BP: 119/84 125/85  Pulse: 64 65  Resp: 17 16  Temp: 98 F (36.7 C) 98.2 F (36.8 C)  SpO2: 97% 97%    Exam:  EOMI ncat no distress, no  pallor no ict s1 s2 RRR Abdomen soft no rebound no guarding Chest is clear Extremities are soft nontender   I have personally reviewed the following:  DATA   Labs:  None today   Scheduled Meds: . enoxaparin (LOVENOX) injection  40 mg Subcutaneous Q24H  . gabapentin  300 mg Oral TID  . multivitamin with minerals  1 tablet Oral Daily  . potassium chloride  40 mEq Oral Daily  . thiamine  100 mg Oral Daily  . traZODone  50 mg Oral QHS   Continuous Infusions:   Principal Problem:   Suicide attempt [facility]) Active Problems:   Insulin overdose   Overdose   Tachypnea   LOS: 4 days

## 2018-12-08 NOTE — Progress Notes (Signed)
LCSW following for inpatient psych placement.   Patient medically stable per attending.   Patient has ankle monitor.   LCSW made referral with Everardo Pacific at [facility].  LCSW awaiting response.   Beulah Gandy [place] [facility] CSW [phone]

## 2018-12-09 LAB — BASIC METABOLIC PANEL
Anion gap: 7 (ref 5–15)
BUN: 12 mg/dL (ref 6–20)
CO2: 26 mmol/L (ref 22–32)
Calcium: 8.9 mg/dL (ref 8.9–10.3)
Chloride: 102 mmol/L (ref 98–111)
Creatinine, Ser: 0.96 mg/dL (ref 0.61–1.24)
GFR calc Af Amer: >60 mL/min (ref >60)
GFR calc non Af Amer: >60 mL/min (ref >60)
Glucose, Bld: 106 mg/dL — ABNORMAL HIGH (ref 70–99)
Potassium: 3.8 mmol/L (ref 3.5–5.1)
Sodium: 135 mmol/L (ref 135–145)

## 2018-12-09 MED ORDER — CHLORDIAZEPOXIDE HCL 5 MG PO CAPS
5.0000 mg | ORAL_CAPSULE | Freq: Two times a day (BID) | ORAL | Status: DC
  Administered 2018-12-09 – 2018-12-10 (×3): 5 mg via ORAL
  Filled 2018-12-09 (×3): qty 1

## 2018-12-09 NOTE — Progress Notes (Signed)
[facility] HOSPITALIST PROGRESS NOTE  Riley [name] EFE:[id number] DOB: [birth year] DOA: 12/03/2018 PCP: Patient, No Pcp Per   Narrative: 28 year old Caucasian male Prior ethanol abuse Bipolar Multiple suicidal attempts-last admitted 05/2017 with overdose of possibly methadone Prior admissions to behavioral health 11/2017 as well as 12/2017 for suicidality  Admitted from [facility], ED 2/12 with toxic metabolic encephalopathy likely secondary to overdose of 750 units of insulin, proximal team, trust.  Was hypoglycemic on admission started on D50, CCM consulted given airway concerns  A & Plan Acute suicidality with overdose of insulin tropium paroxetine Medically stable -transfer at any time to IP psychiatric facility Psychiatry added Guaiphenesin 300 tid and trazodone 50 hs sleep Await IP pscyh facility--CSW aware= Sirs No sepsis physiology per se likely component of anxiety and physiologic stress D/c Zosyn given neg work up Ethanol abuse stop Librium protocol 2/15 Vistaril 25 tid prn anxiety and gabentin 300 tid to aid withdrawals Hypokalemia imrpoved-intermittent labs Urinary retention resolved Bipolar See above discussion Per psych   Lovenox, full code,  He is stable for transfer at any time to [facility] or psychiatric faciltiy  Mahala Menghini, MD  [facility] Via amion app OR [email or web address] 7PM-7AM contact night coverage as above 12/09/2018, 9:53 AM  LOS: 5 days   Consultants:  CCM  Psychiatry  Procedures:  None  Antimicrobials:  No  Interval history/Subjective:  Slept poorly otherwise well  Objective:  Vitals:  Vitals:   12/08/18 2002 12/09/18 0617  BP: (!) 140/96 113/78  Pulse: 64 (!) 57  Resp: 16 18  Temp: 98.1 F (36.7 C) (!) 97.4 F (36.3 C)  SpO2: 98% 97%    Exam:  EOMI ncat no distress, no pallor no ict s1 s2 RRR Abdomen soft no rebound no guarding cta b   I have personally reviewed the following:  DATA   Labs:  bmet  wnl   Scheduled Meds: . enoxaparin (LOVENOX) injection  40 mg Subcutaneous Q24H  . gabapentin  300 mg Oral TID  . potassium chloride  40 mEq Oral Daily  . traZODone  50 mg Oral QHS   Continuous Infusions:   Principal Problem:   Suicide attempt [facility]) Active Problems:   Insulin overdose   Overdose   Tachypnea   LOS: 5 days

## 2018-12-09 NOTE — Progress Notes (Signed)
LCSW followed up with Thayer Ohm at [facility].   LCSW reiterated the fact that patient has ankle monitor.   Thayer Ohm stated " it will be a while"   LCSW will continue to follow.   Beulah Gandy [place] Long CSW [phone]

## 2018-12-09 NOTE — Progress Notes (Signed)
LCSW followed up with [facility].   Patient has not been reviewed yet. [facility] will follow up once patient is reviewed.   Beulah Gandy [place] Long CSW [phone]

## 2018-12-10 MED ORDER — CHLORDIAZEPOXIDE HCL 5 MG PO CAPS
5.0000 mg | ORAL_CAPSULE | Freq: Every day | ORAL | Status: DC
  Administered 2018-12-11: 5 mg via ORAL
  Filled 2018-12-10: qty 1

## 2018-12-10 NOTE — Progress Notes (Signed)
PROGRESS NOTE  Brief Narrative: Riley [name] is a 28 y.o. male with a history of bipolar disorder, multiple suicide attempts and [facility] admissions who presented to the ED 2/12 with toxic metabolic encephalopathy due to multiple drug overdose including insulin, paroxetine. Hypoglycemic on arrival, started on D50, CCM consulted given airway concerns. BAL was 204 and UDS was positive for THC. Medically has stabilized and is awaiting inpatient psychiatric bed.   Subjective: No new complaints. Continues to have SI.   Objective: BP 120/80 (BP Location: Left Arm)   Pulse 70   Temp 98.5 F (36.9 C)   Resp 18   Ht 5\' 7"  (1.702 m)   Wt 71.3 kg   SpO2 98%   BMI 24.62 kg/m   Gen: Nontoxic male in no distress Pulm: Nonlabored Neuro: Alert and oriented. No focal deficits.  Assessment & Plan: Suicide attempt by multiple drug overdose: Medically stabilized and awaiting transfer to [facility].  - Continue trazodone, vistaril, gabapentin  Bipolar disorder:  - Per psychiatry  SIRS: Sepsis ruled out.  - Stopped antibiotics with negative work up.   Alcohol abuse:  - Complete librium taper, no evidence of withdrawal at this time.   Hypokalemia: Resolved  Acute urinary retention: Resolved.  Tyrone Nine, MD Pager [phone] 12/10/2018, 8:14 PM

## 2018-12-10 NOTE — Progress Notes (Signed)
LCSW spoke with Thayer Ohm at [facility].   Per Thayer Ohm, [facility] anticipates discharges today and will follow up with LCSW.    LCSW will continue to follow.   Beulah Gandy [place] Long CSW [phone]

## 2018-12-11 ENCOUNTER — Inpatient Hospital Stay (HOSPITAL_COMMUNITY)
Admission: AD | Admit: 2018-12-11 | Discharge: 2018-12-15 | DRG: 881 | Disposition: A | Discharge status: Home / Self Care | Payer: Federal, State, Local not specified - Other | Source: Intra-hospital | Attending: Psychiatry | Admitting: Psychiatry

## 2018-12-11 ENCOUNTER — Other Ambulatory Visit: Payer: Self-pay

## 2018-12-11 ENCOUNTER — Encounter (HOSPITAL_COMMUNITY): Payer: Self-pay

## 2018-12-11 DIAGNOSIS — F329 Major depressive disorder, single episode, unspecified: Principal | ICD-10-CM | POA: Diagnosis present

## 2018-12-11 DIAGNOSIS — Z590 Homelessness: Secondary | ICD-10-CM | POA: Diagnosis not present

## 2018-12-11 DIAGNOSIS — F1020 Alcohol dependence, uncomplicated: Secondary | ICD-10-CM | POA: Diagnosis present

## 2018-12-11 DIAGNOSIS — R45851 Suicidal ideations: Secondary | ICD-10-CM | POA: Diagnosis present

## 2018-12-11 DIAGNOSIS — F1220 Cannabis dependence, uncomplicated: Secondary | ICD-10-CM | POA: Diagnosis present

## 2018-12-11 DIAGNOSIS — Z915 Personal history of self-harm: Secondary | ICD-10-CM | POA: Diagnosis not present

## 2018-12-11 DIAGNOSIS — F17210 Nicotine dependence, cigarettes, uncomplicated: Secondary | ICD-10-CM | POA: Diagnosis present

## 2018-12-11 DIAGNOSIS — F322 Major depressive disorder, single episode, severe without psychotic features: Secondary | ICD-10-CM | POA: Diagnosis present

## 2018-12-11 MED ORDER — GABAPENTIN 300 MG PO CAPS: 300.0000 mg | ORAL_CAPSULE | Freq: Three times a day (TID) | ORAL | Status: DC

## 2018-12-11 MED ORDER — NICOTINE POLACRILEX 2 MG MT GUM
2.0000 mg | CHEWING_GUM | OROMUCOSAL | Status: DC | PRN
  Administered 2018-12-11 – 2018-12-15 (×8): 2 mg via ORAL
  Filled 2018-12-11 (×4): qty 1

## 2018-12-11 MED ORDER — ACETAMINOPHEN 325 MG PO TABS: 650.0000 mg | ORAL_TABLET | Freq: Four times a day (QID) | ORAL | Status: DC | PRN

## 2018-12-11 MED ORDER — NICOTINE POLACRILEX 2 MG MT GUM
CHEWING_GUM | OROMUCOSAL | Status: AC
  Filled 2018-12-11: qty 1

## 2018-12-11 MED ORDER — HYDROXYZINE HCL 25 MG PO TABS
25.0000 mg | ORAL_TABLET | Freq: Four times a day (QID) | ORAL | Status: DC | PRN
  Administered 2018-12-11 – 2018-12-15 (×9): 25 mg via ORAL
  Filled 2018-12-11 (×9): qty 1

## 2018-12-11 MED ORDER — TRAZODONE HCL 50 MG PO TABS: 50.0000 mg | ORAL_TABLET | Freq: Every day | ORAL | Status: DC

## 2018-12-11 MED ORDER — TRAZODONE HCL 50 MG PO TABS
50.0000 mg | ORAL_TABLET | Freq: Every evening | ORAL | Status: DC | PRN
  Administered 2018-12-11 – 2018-12-12 (×2): 50 mg via ORAL
  Filled 2018-12-11 (×2): qty 1

## 2018-12-11 MED ORDER — ALUM & MAG HYDROXIDE-SIMETH 200-200-20 MG/5ML PO SUSP
30.0000 mL | ORAL | Status: DC | PRN
  Administered 2018-12-15: 30 mL via ORAL
  Filled 2018-12-11: qty 30

## 2018-12-11 MED ORDER — MAGNESIUM HYDROXIDE 400 MG/5ML PO SUSP: 30.0000 mL | Freq: Every day | ORAL | Status: DC | PRN

## 2018-12-11 NOTE — Progress Notes (Signed)
Admission Note: Patient is a 28 year old male who is admitted to the unit for suicidal ideation by insulin overdose, depression and anxiety.  Patient is alert and oriented x 4.  Presents with anxious affect and mood.  Admission plan of care reviewed and consent signed.  Skin assessment and personal belongings completed.  No contraband found.  Skin is dry and intact.  Patient oriented to the unit, staff and room.  Routine safety checks initiated.  Verbalizes understanding of unit rules and protocols.  Patient is safe on the unit.

## 2018-12-11 NOTE — Progress Notes (Signed)
Patient has discharged to [facility] on 12/11/2018. Discharge instruction including medication and appointment was in a package and sent to [facility] by [place] transportation. Patient is aware of discharge plan and has no question at this time.

## 2018-12-11 NOTE — Progress Notes (Signed)
Pt said his day was a 2. The one good thing that happen to him he came here.

## 2018-12-11 NOTE — Discharge Summary (Signed)
Physician Discharge Summary  [name] MMN:[id number] DOB: August 31, 1991 DOA: 12/03/2018  PCP: Patient, No Pcp Per  Admit date: 12/03/2018 Discharge date: 12/11/2018  Admitted From: Home Disposition: [facility]   Recommendations for Outpatient Follow-up:  1. Follow up with psychiatry as indicated  Home Health: None Equipment/Devices: None Discharge Condition: Stable CODE STATUS: Full Diet recommendation: As tolerated  Brief/Interim Summary: Riley [name] is a 28 y.o. male with a history of bipolar disorder, multiple suicide attempts and [facility] admissions who presented to the ED 2/12 with toxic metabolic encephalopathy due to multiple drug overdose including insulin, paroxetine. Hypoglycemic on arrival, started on D50, CCM consulted given airway concerns. BAL was 204 and UDS was positive for THC. Medically has stabilized and is awaiting inpatient psychiatric bed.   Discharge Diagnoses:  Principal Problem:   Suicide attempt [facility]) Active Problems:   Insulin overdose   Overdose   Tachypnea  Suicide attempt by multiple drug overdose: Medically stabilized and awaiting transfer to [facility].  - Continue trazodone, vistaril, gabapentin as recommended by psychiatry  Bipolar disorder:  - Per psychiatry - Awaiting inpatient admission.   SIRS: Sepsis ruled out.  - Stopped antibiotics with negative work up.   Alcohol abuse:  - Complete librium taper to off 2/21, no evidence of withdrawal at this time.   Tobacco use:  - Cessation counseling provided - Nicotine patch may be used.  Hypokalemia: Resolved  Acute urinary retention: Resolved.  Discharge Instructions  Allergies as of 12/11/2018   No Known Allergies     Medication List    TAKE these medications   gabapentin 300 MG capsule Commonly known as:  NEURONTIN Take 1 capsule (300 mg total) by mouth 3 (three) times daily.   multivitamin with minerals Tabs tablet Take 1 tablet by mouth daily.   nicotine 21 mg/24hr  patch Commonly known as:  NICODERM CQ - dosed in mg/24 hours Place 1 patch (21 mg total) onto the skin daily.   thiamine 100 MG tablet Take 1 tablet (100 mg total) by mouth daily.   traZODone 50 MG tablet Commonly known as:  DESYREL Take 1 tablet (50 mg total) by mouth at bedtime.       No Known Allergies  Consultations:  Psychiatry  Procedures/Studies: Dg Chest Port 1 View  Result Date: 12/05/2018 CLINICAL DATA:  Shortness of Breath EXAM: PORTABLE CHEST 1 VIEW COMPARISON:  December 03, 2018 FINDINGS: There is no appreciable edema or consolidation. The heart size and pulmonary vascularity are normal. No adenopathy. No bone lesions. IMPRESSION: No edema or consolidation. Electronically Signed   By: Bretta Bang III M.D.   On: 12/05/2018 07:27   Dg Chest Port 1 View  Result Date: 12/03/2018 CLINICAL DATA:  Tachypnea EXAM: PORTABLE CHEST 1 VIEW COMPARISON:  06/11/2018 FINDINGS: The cardiomediastinal silhouette is unremarkable. There is no evidence of focal airspace disease, pulmonary edema, suspicious pulmonary nodule/mass, pleural effusion, or pneumothorax. No acute bony abnormalities are identified. IMPRESSION: No active disease. Electronically Signed   By: Harmon Pier M.D.   On: 12/03/2018 08:49    Subjective: No new complaints. Has inquiries about "right-to-die".  Discharge Exam: Vitals:   12/11/18 0641 12/11/18 1410  BP: 107/82 124/81  Pulse: 60 80  Resp: 16 20  Temp: 97.8 F (36.6 C) 97.9 F (36.6 C)  SpO2: 97% 98%   Gen: Nontoxic male in no distress. Pulm: Nonlabored. Neuro: Alert and oriented. No focal deficits. Psych: +SI  Labs: BNP (last 3 results) No results for input(s): BNP in  the last 8760 hours. Basic Metabolic Panel: Recent Labs  Lab 12/05/18 0306 12/06/18 0553 12/07/18 0544 12/09/18 0613  NA 134* 136 136 135  K 3.3* 3.4* 3.6 3.8  CL 100 101 100 102  CO2 24 27 28 26   GLUCOSE 132* 100* 107* 106*  BUN 5* 8 11 12   CREATININE 0.93 0.89  0.99 0.96  CALCIUM 8.8* 8.6* 8.7* 8.9  PHOS  --  3.5 3.6  --    Liver Function Tests: Recent Labs  Lab 12/06/18 0553 12/07/18 0544  ALBUMIN 3.5 3.4*   No results for input(s): LIPASE, AMYLASE in the last 168 hours. No results for input(s): AMMONIA in the last 168 hours. CBC: Recent Labs  Lab 12/05/18 0306 12/06/18 0553  WBC 9.0 6.5  NEUTROABS 4.6 2.9  HGB 17.0 16.6  HCT 50.8 49.8  MCV 98.8 97.6  PLT 295 272   Cardiac Enzymes: No results for input(s): CKTOTAL, CKMB, CKMBINDEX, TROPONINI in the last 168 hours. BNP: Invalid input(s): POCBNP CBG: Recent Labs  Lab 12/05/18 2202 12/06/18 0024 12/06/18 0206 12/06/18 0357 12/06/18 0805  GLUCAP 97 121* 120* 106* 105*   D-Dimer No results for input(s): DDIMER in the last 72 hours. Hgb A1c No results for input(s): HGBA1C in the last 72 hours. Lipid Profile No results for input(s): CHOL, HDL, LDLCALC, TRIG, CHOLHDL, LDLDIRECT in the last 72 hours. Thyroid function studies No results for input(s): TSH, T4TOTAL, T3FREE, THYROIDAB in the last 72 hours.  Invalid input(s): FREET3 Anemia work up No results for input(s): VITAMINB12, FOLATE, FERRITIN, TIBC, IRON, RETICCTPCT in the last 72 hours. Urinalysis    Component Value Date/Time   COLORURINE AMBER (A) 12/03/2018 0409   APPEARANCEUR CLEAR 12/03/2018 0409   LABSPEC 1.013 12/03/2018 0409   PHURINE 5.0 12/03/2018 0409   GLUCOSEU 150 (A) 12/03/2018 0409   HGBUR NEGATIVE 12/03/2018 0409   BILIRUBINUR NEGATIVE 12/03/2018 0409   KETONESUR NEGATIVE 12/03/2018 0409   PROTEINUR NEGATIVE 12/03/2018 0409   NITRITE POSITIVE (A) 12/03/2018 0409   LEUKOCYTESUR NEGATIVE 12/03/2018 0409    Microbiology Recent Results (from the past 240 hour(s))  MRSA PCR Screening     Status: None   Collection Time: 12/03/18  6:37 AM  Result Value Ref Range Status   MRSA by PCR NEGATIVE NEGATIVE Final    Comment:        The GeneXpert MRSA Assay (FDA approved for NASAL specimens only), is  one component of a comprehensive MRSA colonization surveillance program. It is not intended to diagnose MRSA infection nor to guide or monitor treatment for MRSA infections. Performed at [facility], [address] [place], [place] [address]   Culture, Urine     Status: None   Collection Time: 12/03/18  8:11 AM  Result Value Ref Range Status   Specimen Description   Final    URINE, CLEAN CATCH Performed at [facility], [address], [place], [place] [address]    Special Requests   Final    NONE Performed at [facility], [address], [place], [place] [address]    Culture   Final    NO GROWTH Performed at [facility], [address], [place], [place] [address]    Report Status 12/04/2018 FINAL  Final  Culture, blood (routine x 2)     Status: None   Collection Time: 12/03/18  9:05 AM  Result Value Ref Range Status   Specimen Description   Final  BLOOD LEFT HAND Performed at [facility], [address] [place], [place] [address]    Special Requests   Final    BOTTLES DRAWN AEROBIC AND ANAEROBIC Blood Culture adequate volume Performed at [facility], [address] [place], [place] [address]    Culture   Final    NO GROWTH 5 DAYS Performed at [facility], [address] [place], [place] [address]    Report Status 12/08/2018 FINAL  Final  Culture, blood (routine x 2)     Status: None   Collection Time: 12/03/18  9:07 AM  Result Value Ref Range Status   Specimen Description   Final    BLOOD RIGHT HAND Performed at [facility], [address] [place], [place] [address]    Special Requests   Final    BOTTLES DRAWN AEROBIC AND ANAEROBIC Blood Culture adequate volume Performed at [facility], [address] [place], [place] [address]    Culture   Final    NO GROWTH 5 DAYS Performed at [facility],  [address] [place], [place] [address]    Report Status 12/08/2018 FINAL  Final    Time coordinating discharge: Approximately 40 minutes  Tyrone Nineyan B Mariyana Fulop, MD  [facility] 12/11/2018, 2:24 PM Pager [phone]

## 2018-12-11 NOTE — Progress Notes (Signed)
PROGRESS NOTE  Brief Narrative: Riley [name] is a 28 y.o. male with a history of bipolar disorder, multiple suicide attempts and [facility] admissions who presented to the ED 2/12 with toxic metabolic encephalopathy due to multiple drug overdose including insulin, paroxetine. Hypoglycemic on arrival, started on D50, CCM consulted given airway concerns. BAL was 204 and UDS was positive for THC. Medically has stabilized and is awaiting inpatient psychiatric bed.   Subjective: No new complaints. Has inquiries about "right-to-die".   Objective: BP 107/82 (BP Location: Right Arm)   Pulse 60   Temp 97.8 F (36.6 C) (Oral)   Resp 16   Ht 5\' 7"  (1.702 m)   Wt 69.9 kg   SpO2 97%   BMI 24.14 kg/m   Gen: Nontoxic male in no distress. Pulm: Nonlabored. Neuro: Alert and oriented. No focal deficits. Psych: +SI  Assessment & Plan: Suicide attempt by multiple drug overdose: Medically stabilized and awaiting transfer to [facility].  - Continue trazodone, vistaril, gabapentin  Bipolar disorder:  - Per psychiatry - Awaiting inpatient admission.   SIRS: Sepsis ruled out.  - Stopped antibiotics with negative work up.   Alcohol abuse:  - Complete librium taper, no evidence of withdrawal at this time.   Hypokalemia: Resolved  Acute urinary retention: Resolved.  Tyrone Nine, MD Pager [phone] 12/11/2018, 1:22 PM

## 2018-12-11 NOTE — Tx Team (Signed)
Initial Treatment Plan 12/11/2018 6:55 PM [name] PRX:[id number]    PATIENT STRESSORS: Financial difficulties Health problems Legal issue   PATIENT STRENGTHS: Communication skills Motivation for treatment/growth   PATIENT IDENTIFIED PROBLEMS: "work on symptoms"  Suicidal Ideation  Alcohol Abuse  Depression               DISCHARGE CRITERIA:  Ability to meet basic life and health needs Motivation to continue treatment in a less acute level of care  PRELIMINARY DISCHARGE PLAN: Attend aftercare/continuing care group Attend 12-step recovery group Outpatient therapy  PATIENT/FAMILY INVOLVEMENT: This treatment plan has been presented to and reviewed with the patient, Riley [name], and/or family member.  The patient and family have been given the opportunity to ask questions and make suggestions.  Clarene Critchley, RN 12/11/2018, 6:55 PM

## 2018-12-11 NOTE — Progress Notes (Addendum)
2:04 PM Patient has bed at [facility]  LCSW confirmed bed with AC lindsey. Patient to report to room 301 bed 1  Attending: [place]  Patient can transport at 5pm.   Patient is voluntary and will transport by [facility] transport arranged for 5pm.   RN report #: [phone]. RN call report prior to patient leaving hospital.  BKJ   1:28pm Patient under review at [facility].   LCSW will continue to follow.   Beulah Gandy [place] Long CSW [phone]

## 2018-12-12 DIAGNOSIS — F1020 Alcohol dependence, uncomplicated: Secondary | ICD-10-CM

## 2018-12-12 MED ORDER — QUETIAPINE FUMARATE 200 MG PO TABS
200.0000 mg | ORAL_TABLET | Freq: Every day | ORAL | Status: DC
  Administered 2018-12-12: 200 mg via ORAL
  Administered 2018-12-13: 100 mg via ORAL
  Filled 2018-12-12 (×4): qty 1

## 2018-12-12 MED ORDER — GABAPENTIN 300 MG PO CAPS
300.0000 mg | ORAL_CAPSULE | Freq: Three times a day (TID) | ORAL | Status: DC
  Administered 2018-12-12 – 2018-12-13 (×4): 300 mg via ORAL
  Filled 2018-12-12 (×9): qty 1

## 2018-12-12 MED ORDER — QUETIAPINE FUMARATE 100 MG PO TABS
100.0000 mg | ORAL_TABLET | Freq: Two times a day (BID) | ORAL | Status: DC
  Administered 2018-12-12 – 2018-12-14 (×5): 100 mg via ORAL
  Filled 2018-12-12 (×9): qty 1

## 2018-12-12 MED ORDER — VORTIOXETINE HBR 10 MG PO TABS
10.0000 mg | ORAL_TABLET | Freq: Every day | ORAL | Status: DC
  Administered 2018-12-12 – 2018-12-15 (×4): 10 mg via ORAL
  Filled 2018-12-12 (×2): qty 1
  Filled 2018-12-12: qty 21
  Filled 2018-12-12 (×3): qty 1
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
## 2018-12-12 NOTE — Plan of Care (Addendum)
Patient self inventory sheet- Patient slept poor last night, sleep medication was requested and was not helpful. Appetite is fair, concentration good, energy level low. Depression, hopelessness, and anxiety all rated 9/10. Denies w/d symptoms. Denies physical pain. Endorses passive SI, denies HI AVH. Verbally contracts for safety. Patient stated "I know I can't do anything in here, there's nowhere I could even hang myself. If I don't get what I need out of this hospital, I guess I'll just go jump off a bridge. I've overdosed twice, and I'm not even a drug addict. I'll just go find a bridge." Patient was told that medicine is only part of it, patient needs to work on making self improvements, too. Patient's goal is to work on "depression, hope." Safety is maintained with 15 minute checks as well as environmental checks. Will continue to monitor.  Problem: Education: Goal: Emotional status will improve Outcome: Not Progressing Goal: Mental status will improve Outcome: Not Progressing Goal: Verbalization of understanding the information provided will improve Outcome: Not Progressing   Problem: Coping: Goal: Ability to identify and develop effective coping behavior will improve Outcome: Not Progressing   Problem: Activity: Goal: Interest or engagement in leisure activities will improve Outcome: Not Progressing

## 2018-12-12 NOTE — [facility] Suicide Risk Assessment (Signed)
[facility] Admission Suicide Risk Assessment   Nursing information obtained from:  Patient Demographic factors:  Male Current Mental Status:  Suicidal ideation indicated by patient Loss Factors:  Legal issues Historical Factors:  Prior suicide attempts Risk Reduction Factors:  NA  Total Time spent with patient: 45 minutes Principal Problem: Suicidality in the context of alcoholism, cannabis dependence, and a severe personality disorder and reports of depressive symptoms Diagnosis:  Active Problems:   Alcohol use disorder, severe, dependence (HCC)  Subjective Data: Patient states he is still suicidal and he will "jump off a bridge" Because overdosing did not work, probably somewhat manipulative but taken at face value would indicate severity of personality pathology  Continued Clinical Symptoms:  Alcohol Use Disorder Identification Test Final Score (AUDIT): 4 The "Alcohol Use Disorders Identification Test", Guidelines for Use in Primary Care, Second Edition.  World Science writer [facility]). Score between 0-7:  no or low risk or alcohol related problems. Score between 8-15:  moderate risk of alcohol related problems. Score between 16-19:  high risk of alcohol related problems. Score 20 or above:  warrants further diagnostic evaluation for alcohol dependence and treatment.   CLINICAL FACTORS:   Depression:   Hopelessness Alcohol/Substance Abuse/Dependencies    COGNITIVE FEATURES THAT CONTRIBUTE TO RISK:  Closed-mindedness    SUICIDE RISK:   Severe:  Frequent, intense, and enduring suicidal ideation, specific plan, no subjective intent, but some objective markers of intent (i.e., choice of lethal method), the method is accessible, some limited preparatory behavior, evidence of impaired self-control, severe dysphoria/symptomatology, multiple risk factors present, and few if any protective factors, particularly a lack of social support.  PLAN OF CARE: Continue to evaluate begin  antidepressant therapies cognitive therapy 15-minute checks  I certify that inpatient services furnished can reasonably be expected to improve the patient's condition.   Malvin Johns, MD 12/12/2018, 9:16 AM

## 2018-12-12 NOTE — [facility] Suicide Risk Assessment (Signed)
[facility] INPATIENT:  Family/Significant Other Suicide Prevention Education  Suicide Prevention Education:  Patient Refusal for Family/Significant Other Suicide Prevention Education: The patient Riley [name] has refused to provide written consent for family/significant other to be provided Family/Significant Other Suicide Prevention Education during admission and/or prior to discharge.  Physician notified.  SPE completed with pt, as pt refused to consent to family contact. SPI pamphlet provided to pt and pt was encouraged to share information with support network, ask questions, and talk about any concerns relating to SPE. Pt denies access to guns/firearms and verbalized understanding of information provided. Mobile Crisis information also provided to pt.   Rona Ravens LCSW 12/12/2018, 12:50 PM

## 2018-12-12 NOTE — Tx Team (Signed)
Interdisciplinary Treatment and Diagnostic Plan Update  12/12/2018 Time of Session: 0830AM Riley [name] MRN: [id number]  Principal Diagnosis: MDD  Secondary Diagnoses: Active Problems:   Alcohol use disorder, severe, dependence (HCC)   Current Medications:  Current Facility-Administered Medications  Medication Dose Route Frequency Provider Last Rate Last Dose  . acetaminophen (TYLENOL) tablet 650 mg  650 mg Oral Q6H PRN Nwoko, Agnes I, NP      . alum & mag hydroxide-simeth (MAALOX/MYLANTA) 200-200-20 MG/5ML suspension 30 mL  30 mL Oral Q4H PRN Nwoko, Agnes I, NP      . hydrOXYzine (ATARAX/VISTARIL) tablet 25 mg  25 mg Oral Q6H PRN Lindell Spar I, NP   25 mg at 12/12/18 0738  . magnesium hydroxide (MILK OF MAGNESIA) suspension 30 mL  30 mL Oral Daily PRN Nwoko, Agnes I, NP      . nicotine polacrilex (NICORETTE) gum 2 mg  2 mg Oral PRN Cobos, Myer Peer, MD   2 mg at 12/12/18 0739  . traZODone (DESYREL) tablet 50 mg  50 mg Oral QHS PRN Lindell Spar I, NP   50 mg at 12/11/18 2132   PTA Medications: Medications Prior to Admission  Medication Sig Dispense Refill Last Dose  . gabapentin (NEURONTIN) 300 MG capsule Take 1 capsule (300 mg total) by mouth 3 (three) times daily.     . Multiple Vitamin (MULTIVITAMIN WITH MINERALS) TABS tablet Take 1 tablet by mouth daily. (Patient not taking: Reported on 12/03/2018) 30 tablet 0 Not Taking at Unknown time  . nicotine (NICODERM CQ - DOSED IN MG/24 HOURS) 21 mg/24hr patch Place 1 patch (21 mg total) onto the skin daily. (Patient not taking: Reported on 12/03/2018) 28 patch 0 Not Taking at Unknown time  . thiamine 100 MG tablet Take 1 tablet (100 mg total) by mouth daily. (Patient not taking: Reported on 12/03/2018) 30 tablet 0 Not Taking at Unknown time  . traZODone (DESYREL) 50 MG tablet Take 1 tablet (50 mg total) by mouth at bedtime.       Patient Stressors: Financial difficulties Health problems Legal issue  Patient Strengths: Medical illustrator for treatment/growth  Treatment Modalities: Medication Management, Group therapy, Case management,  1 to 1 session with clinician, Psychoeducation, Recreational therapy.   Physician Treatment Plan for Primary Diagnosis:MDD  Medication Management: Evaluate patient's response, side effects, and tolerance of medication regimen.  Therapeutic Interventions: 1 to 1 sessions, Unit Group sessions and Medication administration.  Evaluation of Outcomes: Not Met  Physician Treatment Plan for Secondary Diagnosis: Active Problems:   Alcohol use disorder, severe, dependence ([place])   Medication Management: Evaluate patient's response, side effects, and tolerance of medication regimen.  Therapeutic Interventions: 1 to 1 sessions, Unit Group sessions and Medication administration.  Evaluation of Outcomes: Not Met   RN Treatment Plan for Primary Diagnosis: MDD Long Term Goal(s): Knowledge of disease and therapeutic regimen to maintain health will improve  Short Term Goals: Ability to remain free from injury will improve, Ability to verbalize frustration and anger appropriately will improve, Ability to disclose and discuss suicidal ideas and Ability to identify and develop effective coping behaviors will improve  Medication Management: RN will administer medications as ordered by provider, will assess and evaluate patient's response and provide education to patient for prescribed medication. RN will report any adverse and/or side effects to prescribing provider.  Therapeutic Interventions: 1 on 1 counseling sessions, Psychoeducation, Medication administration, Evaluate responses to treatment, Monitor vital signs and CBGs as ordered, Perform/monitor CIWA, COWS, AIMS  and Fall Risk screenings as ordered, Perform wound care treatments as ordered.  Evaluation of Outcomes: Not Met   LCSW Treatment Plan for Primary Diagnosis: MDD Long Term Goal(s): Safe transition to appropriate next  level of care at discharge, Engage patient in therapeutic group addressing interpersonal concerns.  Short Term Goals: Engage patient in aftercare planning with referrals and resources, Facilitate patient progression through stages of change regarding substance use diagnoses and concerns and Identify triggers associated with mental health/substance abuse issues  Therapeutic Interventions: Assess for all discharge needs, 1 to 1 time with Social worker, Explore available resources and support systems, Assess for adequacy in community support network, Educate family and significant other(s) on suicide prevention, Complete Psychosocial Assessment, Interpersonal group therapy.  Evaluation of Outcomes: Not Met   Progress in Treatment: Attending groups: No. Participating in groups: No. New to unit. Continuing to assess.  Taking medication as prescribed: Yes. Toleration medication: Yes. Family/Significant other contact made: No, will contact:  family member with consent from pt.  Patient understands diagnosis: Yes. Discussing patient identified problems/goals with staff: Yes. Medical problems stabilized or resolved: Yes. Denies suicidal/homicidal ideation: Yes. Issues/concerns per patient self-inventory: No. Other: n/a  New problem(s) identified: No, Describe:  n/a  New Short Term/Long Term Goal(s): detox, medication management for mood stabilization; elimination of SI thoughts; development of comprehensive mental wellness/sobriety plan.   Patient Goals:  "To work on my depression symptoms."   Discharge Plan or Barriers: CSW assessing for appropriate referrals. [place] pamphlet, Mobile Crisis information, and AA/NA information provided to patient for additional community support and resources.   Reason for Continuation of Hospitalization: Anxiety Depression Medication stabilization Suicidal ideation Withdrawal symptoms  Estimated Length of Stay: Monday, 12/15/2018  Attendees: Patient:  Riley [name] 12/12/2018 8:48 AM  Physician: Dr. Jake Samples MD 12/12/2018 8:48 AM  Nursing: Yetta Flock RN 12/12/2018 8:48 AM  RN Care Manager:x 12/12/2018 8:48 AM  Social Worker: Janice Norrie LCSW 12/12/2018 8:48 AM  Recreational Therapist: x 12/12/2018 8:48 AM  Other: Lindell Spar NP; Harriett Sine NP 12/12/2018 8:48 AM  Other:  12/12/2018 8:48 AM  Other: 12/12/2018 8:48 AM    Scribe for Treatment Team: Avelina Laine, LCSW 12/12/2018 8:48 AM

## 2018-12-12 NOTE — [facility] Group Notes (Signed)
LCSW Group Therapy Note  12/12/2018 1:15pm  Type of Therapy and Topic:  Group Therapy: Avoiding Self-Sabotaging and Enabling Behaviors  Participation Level:  Did Not Attend   Description of Group:   In this group, patients will learn how to identify obstacles, self-sabotaging and enabling behaviors, as well as: what are they, why do we do them and what needs these behaviors meet. Discuss unhealthy relationships and how to have positive healthy boundaries with those that sabotage and enable. Explore aspects of self-sabotage and enabling in yourself and how to limit these self-destructive behaviors in everyday life.    Marian Sorrow, MSW Intern 12/12/2018 3:02 PM

## 2018-12-12 NOTE — Progress Notes (Signed)
Adult Psychoeducational Group Note  Date:  12/12/2018 Time:  9:00 AM  Group Topic/Focus:  Goals Group:   The focus of this group is to help patients establish daily goals to achieve during treatment and discuss how the patient can incorporate goal setting into their daily lives to aide in recovery.  Participation Level:  Active  Participation Quality:  Appropriate  Affect:  Appropriate  Cognitive:  Alert  Insight: Appropriate  Engagement in Group:  Engaged  Modes of Intervention:  Discussion  Additional Comments:  Patient stated that his goal for today was to find hope. Patient has no other complaints or concerns at this time.   Jannely Henthorn L Sharniece Gibbon 12/12/2018, 9:00 AM
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
## 2018-12-13 MED ORDER — QUETIAPINE FUMARATE ER 50 MG PO TB24: 100.0000 mg | ORAL_TABLET | Freq: Once | ORAL | Status: DC

## 2018-12-13 MED ORDER — QUETIAPINE FUMARATE 100 MG PO TABS
100.0000 mg | ORAL_TABLET | Freq: Once | ORAL | Status: AC
  Administered 2018-12-13: 100 mg via ORAL
  Filled 2018-12-13: qty 1

## 2018-12-13 MED ORDER — TRAZODONE HCL 100 MG PO TABS
100.0000 mg | ORAL_TABLET | Freq: Every evening | ORAL | Status: DC | PRN
  Administered 2018-12-13 (×2): 100 mg via ORAL
  Filled 2018-12-13: qty 2

## 2018-12-13 MED ORDER — GABAPENTIN 400 MG PO CAPS
400.0000 mg | ORAL_CAPSULE | Freq: Three times a day (TID) | ORAL | Status: DC
  Administered 2018-12-13 – 2018-12-15 (×7): 400 mg via ORAL
  Filled 2018-12-13 (×2): qty 1
  Filled 2018-12-13: qty 63
  Filled 2018-12-13 (×4): qty 1
  Filled 2018-12-13 (×2): qty 63
  Filled 2018-12-13 (×5): qty 1

## 2018-12-13 NOTE — Progress Notes (Signed)
D   Pt is calm and cooperative   He is somewhat irritable and has minimal interaction with others and he did not go to group tonight   He said he wasn't ready for group situations yet   He said his thinking isnt clear and that he is just now getting the medications that help his thinking clear up A   Verbal support given   Medications administered and effectiveness monitored   Q 15 min checks   Encouragement given R   Pt remains safe at this time

## 2018-12-13 NOTE — Progress Notes (Signed)
D. Pt presents with an anxious affect/ mood- became somewhat agitated after peer was discharged this am. Pt reported that he felt like he was "going to have a meltdown" if he didn't get anything stronger than vistaril- when asked to describe what he meant, pt reported that he felt like he "was going to give up". Pt became demanding- stating that vistaril wasn't going to cut it. Per pt's self inventory, pt rates his depression, hopelessness and anxiety a 7/8/8, respectively. Sat down with pt- provided emotional support- .  Pt currently endorses passive SI- agrees to contact staff before acting onon any harmful thoughts.  A. Labs and vitals monitored. Pt compliant with  medications. Pt supported emotionally and encouraged to express concerns and ask questions.   R. Pt remains safe with 15 minute checks. Will continue POC.

## 2018-12-13 NOTE — [facility] Group Notes (Signed)
[facility] Group Notes: (Clinical Social Work)   12/13/2018      Type of Therapy:  Group Therapy   Participation Level:  Did Not Attend - was present at the beginning of group but left and did not return after just a few moments.   Ambrose Mantle, LCSW 12/13/2018, 12:54 PM

## 2018-12-13 NOTE — Progress Notes (Signed)
Patient did attend the evening speaker AA meeting.  

## 2018-12-13 NOTE — Progress Notes (Signed)
Adult Psychoeducational Group Note  Date:  12/13/2018 Time: 9:05am-10:05am  Group Topic/Focus:  Goals Group:   The focus of this group is to help patients establish daily goals to achieve during treatment and discuss how the patient can incorporate goal setting into their daily lives to aide in recovery.  Participation Level:  Active  Participation Quality:  Appropriate, Attentive and Sharing  Affect:  Appropriate  Cognitive:  Alert, Appropriate and Oriented  Insight: Appropriate  Engagement in Group:  Engaged  Modes of Intervention:  Discussion and Support  Additional Comments:  Patient informed the group that his goal is to achieve and keep a positive mindset. Patient informed the group that he would try music. Patient informed the group that he did not want to rate his day because he did not feel that enough of his day had passed. Patient informed the group that he was looking forward to feeling hopeful.   Annye Asa 12/13/2018, 7:06 PM

## 2018-12-13 NOTE — [facility] Group Notes (Signed)
[facility] Group Notes:  (Nursing)  Date:  12/13/2018  Time: 1:15 PM Type of Therapy:  Nurse Education  Participation Level:  Did Not Attend    Riley [name] 12/13/2018, 5:24 PM

## 2018-12-13 NOTE — B[facility] Counselor (Addendum)
Clinical Social Work Note  Pt asked to see CSW then requested to hear a few songs.  While CSW wrote notes, this was allowed because he had a rough day and the nursing staff had requested that CSW try to help him calm down.  Toward the end of listening to the music, patient started making suicidal statements such as "if there was a way, believe me I wouldn't be here.  I'm not just looking for attention.  I want to die."  He said he has had two overdoses and found out that didn't work, so "Maybe I'll have to go jump from one of ya'lls parking decks when I get out of here."   He was able to be redirected back to his enjoyment of the music and how much that meant to him, and said he would not be looking for ways to hurt himself while at [facility].    Ambrose Mantle, LCSW 12/13/2018, 5:16 PM
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
## 2018-12-14 MED ORDER — QUETIAPINE FUMARATE 100 MG PO TABS
100.0000 mg | ORAL_TABLET | Freq: Three times a day (TID) | ORAL | Status: DC
  Administered 2018-12-14 – 2018-12-15 (×3): 100 mg via ORAL
  Filled 2018-12-14: qty 63
  Filled 2018-12-14: qty 1
  Filled 2018-12-14: qty 63
  Filled 2018-12-14: qty 1
  Filled 2018-12-14: qty 63
  Filled 2018-12-14 (×4): qty 1

## 2018-12-14 MED ORDER — QUETIAPINE FUMARATE 100 MG PO TABS
100.0000 mg | ORAL_TABLET | Freq: Every day | ORAL | Status: DC | PRN
  Administered 2018-12-14: 100 mg via ORAL
  Filled 2018-12-14: qty 1

## 2018-12-14 MED ORDER — TRAZODONE HCL 100 MG PO TABS
100.0000 mg | ORAL_TABLET | Freq: Every evening | ORAL | Status: DC | PRN
  Administered 2018-12-14: 100 mg via ORAL
  Filled 2018-12-14: qty 2

## 2018-12-14 MED ORDER — TRAZODONE HCL 100 MG PO TABS: 200.0000 mg | ORAL_TABLET | Freq: Every evening | ORAL | Status: DC | PRN
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
## 2018-12-14 NOTE — [facility] Group Notes (Signed)
[facility] LCSW Group Therapy Note  12/14/2018   10:00-11:00AM  Type of Therapy and Topic:  Group Therapy:  Unhealthy versus Healthy Supports, Which Am I?  Participation Level:  None   Description of Group:  Patients in this group were introduced to the concept that additional supports including self-support are an essential part of recovery.  Initially a discussion was held about the differences between healthy versus unhealthy supports.  Patients were asked to share what unhealthy supports in their lives need to be addressed, as well as what additional healthy supports could be added for greater help in reaching their goals.   A song entitled "My Own Hero" was played and a group discussion ensued in which patients stated they could relate to the song and it inspired them to realize they have be willing to help themselves in order to succeed, because other people cannot achieve sobriety or stability for them.  We discussed adding a variety of healthy supports to address the various needs in patient lives, including becoming more self-supportive.  Therapeutic Goals: 1)  Highlight the differences between healthy and unhealthy supports 2)  Suggest the importance of being a part of one's own support system 2)  Discuss reasons people in one's life may eventually be unable to be continually supportive  3)  Identify the patient's current support system and   4)  elicit commitments to add healthy supports and to become more conscious of being self-supportive   Summary of Patient Progress:  The patient shared his name in group when it was his turn, then asked permission to "pass."  Shortly after that, he left and did not return.  Therapeutic Modalities:   Motivational Interviewing Activity  Lynnell Chad

## 2018-12-14 NOTE — Progress Notes (Signed)
Patient did attend the evening speaker AA meeting.  

## 2018-12-14 NOTE — [facility] Group Notes (Signed)
[facility] Group Notes:  (Nursing)  Date:  12/14/2018  Time: 1:15 PM Type of Therapy:  Nurse Education  Participation Level:  Did Not Attend   Shela Nevin 12/14/2018, 7:45 PM

## 2018-12-14 NOTE — Progress Notes (Signed)
D. Pt presents with an anxious affect/improving mood- per pt's self inventory, pt rates his depression, hopelessness and anxiety an 8/8/9, respectively. Pt writes that his most important goal today is "think better thoughts" and writes that he will put in "more effort" to help him to achieve that goal. Pt currently endorses passive SI with no plan, agreeing to contact staff before acting on any harmful thoughts.   A. Labs and vitals monitored. Pt compliant with medications. Pt supported emotionally and encouraged to express concerns and ask questions.   R. Pt remains safe with 15 minute checks. Will continue POC.

## 2018-12-15 MED ORDER — TRAZODONE HCL 100 MG PO TABS: 100.0000 mg | ORAL_TABLET | Freq: Every evening | ORAL | 1 refills | Status: DC | PRN

## 2018-12-15 MED ORDER — GABAPENTIN 400 MG PO CAPS: 400.0000 mg | ORAL_CAPSULE | Freq: Three times a day (TID) | ORAL | 1 refills | Status: DC

## 2018-12-15 MED ORDER — QUETIAPINE FUMARATE 100 MG PO TABS: 100.0000 mg | ORAL_TABLET | Freq: Three times a day (TID) | ORAL | 1 refills | Status: DC

## 2018-12-15 MED ORDER — VORTIOXETINE HBR 10 MG PO TABS: 10.0000 mg | ORAL_TABLET | Freq: Every day | ORAL | 2 refills | Status: DC

## 2018-12-15 MED ORDER — TRAZODONE HCL 100 MG PO TABS
100.0000 mg | ORAL_TABLET | Freq: Every evening | ORAL | Status: DC | PRN
  Filled 2018-12-15: qty 21

## 2018-12-15 NOTE — Progress Notes (Signed)
Recreation Therapy Notes  Date:  2. 9. 20 Time: 0930 Location: [address] Dayroom  Group Topic: Stress Management  Goal Area(s) Addresses:  Patient will identify positive stress management techniques. Patient will identify benefits of using stress management post d/c.  Behavioral Response: Engaged  Intervention: Worksheet  Activity :  Customer service manager.  LRT introduced the stress management technique of meditation.  LRT played a meditation that focused on having a choice in changing the way they think about things.  Patients were to follow along as meditation played to engage in activity.  Education:  Stress Management, Discharge Planning.   Education Outcome: Acknowledges Education  Clinical Observations/Feedback: Pt attended and participated in group.     Caroll Rancher, LRT/CTRS    Caroll Rancher A 12/15/2018 10:44 AM

## 2018-12-15 NOTE — Progress Notes (Signed)
D: Patient observed up and visible in the milieu. Observed in AA, also coloring in dayroom. Patient remains labile, easily agitated. When explaining medications for this evening, patient became angry stating, "that's not what we talked about earlier today" (referring to provider). Patient's affect animated, mood labile, anxious. Denies pain, physical complaints.   A: Medicated per orders, prn seroquel, trazadone given per his request. Medication education provided. Level III obs in place for safety. Emotional support offered. Patient encouraged to complete Suicide Safety Plan before discharge. Encouraged to attend and participate in unit programming.   R: Patient verbalizes understanding of POC and was much calmer once he allowed this RN to explain medication orders. On reassess, patient was resting in bed. He has not required repeat dose of PRN trazadone. Patient continues to state, "if I attempt suicide again, I will be successful this time. I know I need to be sober. Last time you all sent me to treatment but I left after a week. I wasn't ready. This time I plan to take it seriously." Denies HI/AVH, verbal contract in place for safety as patient states, "yeah, I think about suicide everyday, but there's nothing here I can hang myself with." He remains safe on level III obs. Will continue to monitor throughout the night.

## 2018-12-15 NOTE — Progress Notes (Signed)
  [facility] Adult Case Management Discharge Plan :  Will you be returning to the same living situation after discharge:  Yes,  with friend At discharge, do you have transportation home?: Yes,  friend picking up Do you have the ability to pay for your medications: No. No insurance, referred to [facility].  Release of information consent forms completed and in the chart; letter on chart. Patient to Follow up at: Follow-up Information    Services, [facility] Follow up.   Why:  Screening for possible admission scheduled for Thursday, 2/27 at 7:45AM. Please bring: 14 day supply of medications/30 day prescription and proof of [employer] residency. You may also walk in if discharged prior to this appt--7:45AM mornings.  Contact information: [address] [place] [address] [phone]        [facility] Follow up.   Specialty:  Behavioral Health Why:  Please follow up for services during walk-in hours, Monday-Friday 8:00a-3:00p. Thank you. Contact information: [address] [place] [address] [phone]           Next level of care provider has access to [facility] Link:no  Safety Planning and Suicide Prevention discussed: Yes,  completed with patient  Have you used any form of tobacco in the last 30 days? (Cigarettes, Smokeless Tobacco, Cigars, and/or Pipes): Yes  Has patient been referred to the Quitline?: Patient refused referral  Patient has been referred for addiction treatment: Yes  Darreld Mclean, LCSWA 12/15/2018, 9:25 AM
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
## 2018-12-15 NOTE — Progress Notes (Signed)
Discharge Note:  Patient discharged with 2 bus tickets.  Patient denied SI and HI.  Denied A/V hallucinations.  Suicide prevention information given and discussed with patient who stated he understood and had no questions.  [facility] suicide information also given to patient.  Survey filled out and placed in appropriate box.  Patient stated he received all his belongings, toiletries, misc items, clothing, prescriptions, medications, etc.  Patient also received his ankle charger.  Patient stated he appreciated all assistance received from [facility] staff.  All required discharge information given to patient at discharge.

## 2018-12-15 NOTE — BHH Suicide Risk Assessment (Signed)
[facility] Discharge Suicide Risk Assessment   Principal Problem: Alcohol use disorder, severe, dependence (HCC) Discharge Diagnoses: Principal Problem:   Alcohol use disorder, severe, dependence (HCC) Active Problems:   MDD (major depressive disorder), severe (HCC)   Total Time spent with patient: 45 minutes  Currently patient is alert oriented to person place time and situation asked for a second interview he stated he always has suicidal thinking but now that he learned that his roommate will allow him back home he no longer is suicidal thinking because he has a place to stay prior to going to [facility], clearly he has a chronic manipulative nature to his statements but also has no problem harming himself in a serious manner.  At any rate he is insistent upon discharge denies current suicidal thoughts plans or intent  Mental Status Per Nursing Assessment::   On Admission:  Suicidal ideation indicated by patient  Demographic Factors:  Male  Loss Factors: Decrease in vocational status  Historical Factors: Impulsivity  Risk Reduction Factors:   Positive therapeutic relationship  Continued Clinical Symptoms:  Alcohol/Substance Abuse/Dependencies  Cognitive Features That Contribute To Risk:  Closed-mindedness and Thought constriction (tunnel vision)    Suicide Risk:  Mild:  Suicidal ideation of limited frequency, intensity, duration, and specificity.  There are no identifiable plans, no associated intent, mild dysphoria and related symptoms, good self-control (both objective and subjective assessment), few other risk factors, and identifiable protective factors, including available and accessible social support.  Follow-up Information    Services, [facility] Follow up.   Why:  Screening for possible admission scheduled for Thursday, 2/27 at 7:45AM. Please bring: 14 day supply of medications/30 day prescription and proof of [employer] residency. You may also walk in if  discharged prior to this appt--7:45AM mornings.  Contact information: [address] [place] [address] [phone]        [facility] Follow up.   Specialty:  Behavioral Health Why:  Please follow up for services during walk-in hours, Monday-Friday 8:00a-3:00p. Thank you. Contact information: [address] [place] [address] [phone]           Plan Of Care/Follow-up recommendations:  Activity:  full  Brytani Voth, MD 12/15/2018, 9:25 AM

## 2018-12-15 NOTE — Progress Notes (Signed)
Per patient request, CSW faxed letter to patient's attorney at [phone] providing the dates of inpatient treatment. A copy of the letter is on patient's chart.  Enid Cutter, LCSW-A Clinical Social Worker

## 2018-12-15 NOTE — Progress Notes (Signed)
D:  Patient stated he does have SI thoughts, not at [facility], contracts for safety.  Denied HI.  Denied A/V hallucination.   A:  Medications administered per MD orders.  Emotional support and encouragement given patient. R:  Safety maintained with 15 minute checks.

## 2018-12-16 ENCOUNTER — Other Ambulatory Visit: Payer: Self-pay

## 2018-12-16 ENCOUNTER — Emergency Department (HOSPITAL_COMMUNITY)
Admission: EM | Admit: 2018-12-16 | Discharge: 2018-12-18 | Disposition: A | Discharge status: Home / Self Care | Payer: Self-pay | Attending: Emergency Medicine | Admitting: Emergency Medicine

## 2018-12-16 ENCOUNTER — Encounter (HOSPITAL_COMMUNITY): Payer: Self-pay | Admitting: Emergency Medicine

## 2018-12-16 DIAGNOSIS — T50901A Poisoning by unspecified drugs, medicaments and biological substances, accidental (unintentional), initial encounter: Secondary | ICD-10-CM | POA: Diagnosis present

## 2018-12-16 DIAGNOSIS — F17210 Nicotine dependence, cigarettes, uncomplicated: Secondary | ICD-10-CM | POA: Insufficient documentation

## 2018-12-16 DIAGNOSIS — T1491XA Suicide attempt, initial encounter: Secondary | ICD-10-CM | POA: Diagnosis present

## 2018-12-16 DIAGNOSIS — Z008 Encounter for other general examination: Secondary | ICD-10-CM | POA: Insufficient documentation

## 2018-12-16 DIAGNOSIS — Z79899 Other long term (current) drug therapy: Secondary | ICD-10-CM | POA: Insufficient documentation

## 2018-12-16 DIAGNOSIS — T50902A Poisoning by unspecified drugs, medicaments and biological substances, intentional self-harm, initial encounter: Secondary | ICD-10-CM | POA: Insufficient documentation

## 2018-12-16 DIAGNOSIS — F329 Major depressive disorder, single episode, unspecified: Secondary | ICD-10-CM | POA: Insufficient documentation

## 2018-12-16 DIAGNOSIS — R45851 Suicidal ideations: Secondary | ICD-10-CM | POA: Insufficient documentation

## 2018-12-16 LAB — CBC
HCT: 40.1 % (ref 39.0–52.0)
Hemoglobin: 13.6 g/dL (ref 13.0–17.0)
MCH: 33.3 pg (ref 26.0–34.0)
MCHC: 33.9 g/dL (ref 30.0–36.0)
MCV: 98 fL (ref 80.0–100.0)
Platelets: 254 10*3/uL (ref 150–400)
RBC: 4.09 MIL/uL — ABNORMAL LOW (ref 4.22–5.81)
RDW: 12.7 % (ref 11.5–15.5)
WBC: 12.8 10*3/uL — ABNORMAL HIGH (ref 4.0–10.5)
nRBC: 0 % (ref 0.0–0.2)

## 2018-12-16 LAB — COMPREHENSIVE METABOLIC PANEL
ALT: 58 U/L — ABNORMAL HIGH (ref 0–44)
AST: 35 U/L (ref 15–41)
Albumin: 3.6 g/dL (ref 3.5–5.0)
Alkaline Phosphatase: 75 U/L (ref 38–126)
Anion gap: 9 (ref 5–15)
BUN: 9 mg/dL (ref 6–20)
CO2: 24 mmol/L (ref 22–32)
CREATININE: 0.71 mg/dL (ref 0.61–1.24)
Calcium: 8.5 mg/dL — ABNORMAL LOW (ref 8.9–10.3)
Chloride: 103 mmol/L (ref 98–111)
GFR calc Af Amer: >60 mL/min (ref >60)
Glucose, Bld: 94 mg/dL (ref 70–99)
Potassium: 3.4 mmol/L — ABNORMAL LOW (ref 3.5–5.1)
Sodium: 136 mmol/L (ref 135–145)
Total Bilirubin: 0.5 mg/dL (ref 0.3–1.2)
Total Protein: 6.4 g/dL — ABNORMAL LOW (ref 6.5–8.1)

## 2018-12-16 LAB — MAGNESIUM: Magnesium: 1.9 mg/dL (ref 1.7–2.4)

## 2018-12-16 LAB — RAPID URINE DRUG SCREEN, HOSP PERFORMED
Amphetamines: NOT DETECTED
BARBITURATES: NOT DETECTED
Benzodiazepines: POSITIVE — AB
Cocaine: NOT DETECTED
Opiates: NOT DETECTED
Tetrahydrocannabinol: POSITIVE — AB

## 2018-12-16 LAB — ACETAMINOPHEN LEVEL
Acetaminophen (Tylenol), Serum: <10 ug/mL — ABNORMAL LOW (ref 10–30)
Acetaminophen (Tylenol), Serum: <10 ug/mL — ABNORMAL LOW (ref 10–30)

## 2018-12-16 LAB — CBG MONITORING, ED: Glucose-Capillary: 93 mg/dL (ref 70–99)

## 2018-12-16 LAB — SALICYLATE LEVEL

## 2018-12-16 LAB — ETHANOL: Alcohol, Ethyl (B): <10 mg/dL (ref <10)

## 2018-12-16 MED ORDER — POTASSIUM CHLORIDE 10 MEQ/100ML IV SOLN
10.0000 meq | Freq: Once | INTRAVENOUS | Status: AC
  Administered 2018-12-16: 10 meq via INTRAVENOUS
  Filled 2018-12-16: qty 100

## 2018-12-16 MED ORDER — CHARCOAL ACTIVATED PO LIQD
50.0000 g | Freq: Once | ORAL | Status: DC
  Filled 2018-12-16: qty 240

## 2018-12-16 MED ORDER — SODIUM CHLORIDE 0.9 % IV BOLUS
1000.0000 mL | Freq: Once | INTRAVENOUS | Status: AC
  Administered 2018-12-16: 1000 mL via INTRAVENOUS

## 2018-12-16 NOTE — ED Notes (Signed)
Pt provided ham sandwich and ginger ale.  Pt alert, oriented, calm and cooperative.

## 2018-12-16 NOTE — ED Notes (Signed)
Spoke with Caryn Bee from [employer] [facility]:  Notes- provided updated v/s, poison control center signing off.

## 2018-12-16 NOTE — ED Notes (Signed)
Pt A&O x 3, no distress noted, calm & cooperative, watching TV at present.  Monitoring for safety, Q 15 min checks in effect. 

## 2018-12-16 NOTE — ED Notes (Signed)
Bed: RESB Expected date:  Expected time:  Means of arrival:  Comments: 28 yo M/ SI-OD

## 2018-12-16 NOTE — ED Notes (Addendum)
Spoke with [employer], Lynn.   Notes:  Update provided for repeat acetaminophen level, repeat EKG.    Continue observation for signs of improved attention span, wait until back at baseline.  Monitor bp's.

## 2018-12-16 NOTE — ED Notes (Signed)
Bed: WBH39 Expected date:  Expected time:  Means of arrival:  Comments: Res B 

## 2018-12-16 NOTE — ED Notes (Signed)
TTS notified that pt is awake.

## 2018-12-16 NOTE — ED Provider Notes (Signed)
[facility]-EMERGENCY DEPT Provider Note   CSN: [id number] Arrival date & time: 12/16/18  9758    History   Chief Complaint Chief Complaint  Patient presents with  . Drug Overdose    HPI Riley [name] is a 28 y.o. male.     Patient presents to the emergency department with a chief complaint of suicide attempt.  He states that he ingested Seroquel, Neurontin, and trazodone prior to arrival.  He reports having taken 1500 mg of each.  He has been seen numerous times for suicide attempts.  He also reports having used alcohol tonight.  Denies any drug use.  States that he has no desire to live.  Denies any other associated problems.  The history is provided by the patient. No language interpreter was used.    Past Medical History:  Diagnosis Date  . Alcoholism [facility])     Patient Active Problem List   Diagnosis Date Noted  . Alcohol use disorder, severe, dependence (HCC) 12/11/2018  . Suicide attempt (HCC)   . Overdose 12/03/2018  . Tachypnea   . Drug overdose   . Methadone overdose (HCC) 06/11/2018  . Alcohol abuse with alcohol-induced mood disorder (HCC) 12/30/2017  . Severe recurrent major depression without psychotic features (HCC) 12/21/2017  . MDD (major depressive disorder), severe (HCC) 12/13/2017  . Alcoholism (HCC) 12/12/2017  . Hypokalemia 12/12/2017  . Suicidal ideations 12/12/2017  . Hypoglycemia due to insulin 12/12/2017  . Alcohol abuse with intoxication (HCC) 12/12/2017  . Hypoglycemia 12/12/2017  . Insulin overdose     History reviewed. No pertinent surgical history.      Home Medications    Prior to Admission medications   Medication Sig Start Date End Date Taking? Authorizing Provider  gabapentin (NEURONTIN) 400 MG capsule Take 1 capsule (400 mg total) by mouth 3 (three) times daily. 12/15/18   Malvin Johns, MD  QUEtiapine (SEROQUEL) 100 MG tablet Take 1 tablet (100 mg total) by mouth 3 (three) times daily. 12/15/18   Malvin Johns, MD  traZODone (DESYREL) 100 MG tablet Take 1 tablet (100 mg total) by mouth at bedtime as needed for sleep. 12/15/18   Malvin Johns, MD  vortioxetine HBr (TRINTELLIX) 10 MG TABS tablet Take 1 tablet (10 mg total) by mouth daily. 12/16/18   Malvin Johns, MD    Family History Family History  Problem Relation Age of Onset  . Hypertension Other   . Diabetes Other     Social History Social History   Tobacco Use  . Smoking status: Current Every Day Smoker    Packs/day: 2.00    Types: Cigarettes  . Smokeless tobacco: Never Used  Substance Use Topics  . Alcohol use: Yes    Comment: Pt stated "I drink  1/2 gal of 100 proof every day"  . Drug use: No     Allergies   Patient has no known allergies.   Review of Systems Review of Systems  All other systems reviewed and are negative.    Physical Exam Updated Vital Signs BP 114/90 (BP Location: Right Arm)   Pulse 97   Temp 97.9 F (36.6 C) (Oral)   Resp 14   Ht 5\' 7"  (1.702 m)   Wt 72.6 kg   SpO2 100%   BMI 25.06 kg/m   Physical Exam Vitals signs and nursing note reviewed.  Constitutional:      Appearance: He is well-developed.  HENT:     Head: Normocephalic and atraumatic.  Eyes:  General: No scleral icterus.       Right eye: No discharge.        Left eye: No discharge.     Conjunctiva/sclera: Conjunctivae normal.     Pupils: Pupils are equal, round, and reactive to light.  Neck:     Musculoskeletal: Normal range of motion and neck supple.     Vascular: No JVD.  Cardiovascular:     Rate and Rhythm: Normal rate and regular rhythm.     Heart sounds: Normal heart sounds. No murmur. No friction rub. No gallop.   Pulmonary:     Effort: Pulmonary effort is normal. No respiratory distress.     Breath sounds: Normal breath sounds. No wheezing or rales.  Chest:     Chest wall: No tenderness.  Abdominal:     General: There is no distension.     Palpations: Abdomen is soft. There is no mass.     Tenderness:  There is no abdominal tenderness. There is no guarding or rebound.  Musculoskeletal: Normal range of motion.        General: No tenderness.  Skin:    General: Skin is warm and dry.  Neurological:     Mental Status: He is alert and oriented to person, place, and time.     Comments: GCS 15 Drowsy  Psychiatric:        Behavior: Behavior normal.        Thought Content: Thought content normal.        Judgment: Judgment normal.      ED Treatments / Results  Labs (all labs ordered are listed, but only abnormal results are displayed) Labs Reviewed  ACETAMINOPHEN LEVEL - Abnormal; Notable for the following components:      Result Value   Acetaminophen (Tylenol), Serum <10 (*)    All other components within normal limits  CBC - Abnormal; Notable for the following components:   WBC 12.8 (*)    RBC 4.09 (*)    All other components within normal limits  COMPREHENSIVE METABOLIC PANEL - Abnormal; Notable for the following components:   Potassium 3.4 (*)    Calcium 8.5 (*)    Total Protein 6.4 (*)    ALT 58 (*)    All other components within normal limits  SALICYLATE LEVEL  ETHANOL  MAGNESIUM  RAPID URINE DRUG SCREEN, HOSP PERFORMED  CBG MONITORING, ED    EKG None  Radiology No results found.  Procedures Procedures (including critical care time)  Medications Ordered in ED Medications - No data to display   Initial Impression / Assessment and Plan / ED Course  I have reviewed the triage vital signs and the nursing notes.  Pertinent labs & imaging results that were available during my care of the patient were reviewed by me and considered in my medical decision making (see chart for details).       Patient with intentional overdose of trazodone, Neurontin, and Seroquel. Reportedly took 1500mg  of each, though this cannot be validated.  Poison control contacted and recommends: Activated charcoal without sorbitol Supportive care Monitor for CNS depression Monitor  QRS 4-hr Tylenol Optimize mag and K  Patient given 2 liters of fluid throughout the night along with a run of potassium.  Still waiting on 4-hour Tylenol level at 0800 and then will need to be seen by TTS if he remains hemodynamically stable.  Patient signed out to [name], who will continue care.  Final Clinical Impressions(s) / ED Diagnoses   Final  diagnoses:  Suicide attempt [facility])    ED Discharge Orders    None       Lovell, Nuttall, PA-C 12/16/18 4098    Ward, Layla Maw, DO 12/16/18 [phone]

## 2018-12-16 NOTE — ED Notes (Signed)
TTS at bedside. 

## 2018-12-16 NOTE — Progress Notes (Signed)
   12/16/18 0947  General Assessment Data  Reason for not completing assessment [facility] attempted to complete assessment pt was unable to be aroused. TTS will assess pt at a later time.

## 2018-12-16 NOTE — ED Triage Notes (Signed)
Patient presents s/p overdose on trazadone, seroquel and gabapentin (1500 mg each). Patient states he does not want to live anymore. Patient recently admitted at [facility].

## 2018-12-16 NOTE — ED Provider Notes (Signed)
Care assumed from PA [employer], please see their note for full details, but in brief Riley [name] is a 28 y.o. male who presents after unintentional overdose of Seroquel, Neurontin and trazodone, reports having taken 1500 mg of each.  History of multiple suicide attempts in the past, also reports using alcohol.  Control was consulted and recommends supportive care with close monitoring for CNS depression or hypotension, they recommend 4-hour Tylenol level which will be completed at 8 AM, patient did have a few episodes of soft blood pressures through the night but was given 2 L of IV fluids and run of potassium and has since stabilized with no further episodes of hypotension.  Plan at shift change is to follow-up on 4-hour Tylenol level continue to monitor blood pressure and order TTS consult once patient is deemed medically cleared.  Physical Exam  BP 125/87   Pulse 94   Temp 97.9 F (36.6 C) (Oral)   Resp 19   Ht 5\' 7"  (1.702 m)   Wt 72.6 kg   SpO2 95%   BMI 25.06 kg/m   Physical Exam Vitals signs and nursing note reviewed.  Constitutional:      General: He is not in acute distress.    Appearance: Normal appearance. He is well-developed. He is not diaphoretic.     Comments: Patient sleeping but easily arousable and talking, alert and oriented  HENT:     Head: Normocephalic and atraumatic.  Eyes:     General:        Right eye: No discharge.        Left eye: No discharge.  Cardiovascular:     Rate and Rhythm: Normal rate and regular rhythm.  Pulmonary:     Effort: Pulmonary effort is normal. No respiratory distress.     Breath sounds: Normal breath sounds.  Neurological:     Mental Status: He is alert and oriented to person, place, and time.     Coordination: Coordination normal.  Psychiatric:        Thought Content: Thought content includes suicidal ideation. Thought content includes suicidal plan.     ED Course/Procedures   Clinical Course as of Dec 17 1519  Tue Dec 16, 2018  0812 8 AM Tylenol level returned negative  Acetaminophen (Tylenol), S(!): <10 [KF]  0950 With a few soft blood pressures with systolics in the 90s, he is still easily arousable and mentating well, but will order additional fluid bolus.   [KF]  1200 After fluids blood pressure has remained stable with systolics >100 Patient is now awake and conversant is ready for TTS evaluation.   [KF]  1519 TTS has seen and evaluated the patient and they recommend overnight observation and reevaluation in the morning.   [KF]    Clinical Course User Index [KF] Dartha Lodge, PA-C    Procedures  MDM   Negative Tylenol level, UDS positive for benzos and THC.  After additional fluid bolus vitals have remained stable with good blood pressures and patient is now awake and alert mentating well.  Poison control has been updated and has signed off on patient case.  He is medically cleared for psychiatric evaluation.  TTS has seen and evaluated the patient and they recommend overnight observation and reevaluation in the morning.  Patient has been placed under ED psych hold with sitter at bedside.  Awaiting psych reevaluation for appropriate disposition.  Final diagnoses:  Suicide attempt (HCC)  Dartha Lodge, PA-C 12/16/18 1522    Ward, Layla Maw, DO 12/17/18 [phone]

## 2018-12-16 NOTE — ED Notes (Addendum)
Pt oriented to room and unit.  Pt is pleasant and cooperative.  Pt states " I have told everyone that I do not want to live any more.  I keep trying to die an easy way and people keep finding me. "  Pt is eating a sandwich and watching tv.  15 minute checks and video monitoring in place.

## 2018-12-16 NOTE — ED Notes (Signed)
Pt belongings (1 blue duffel bag; 1 Gulden pt belongings bag, 1 Fahr pt packet containing medications) placed in pt cupboard labeled RESB.  Blue duffel too large for cabinet, placed out of sight below nurse deck in vicinity of RESB cupboard.
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
## 2018-12-16 NOTE — BH Assessment (Addendum)
[facility]HH Assessment Progress Note  Per Caryn Bee, PMHNP, this pt is to remain at [facility] overnight for further observation and stabilization.  Charge nurse has been notified.  Pt is currently under voluntary status.  Doylene Canning, [place] Behavioral Health Coordinator [phone]

## 2018-12-17 DIAGNOSIS — T1491XA Suicide attempt, initial encounter: Secondary | ICD-10-CM

## 2018-12-17 MED ORDER — DIPHENHYDRAMINE HCL 25 MG PO CAPS
50.0000 mg | ORAL_CAPSULE | Freq: Three times a day (TID) | ORAL | Status: DC | PRN
  Administered 2018-12-17: 50 mg via ORAL
  Filled 2018-12-17: qty 2

## 2018-12-17 NOTE — Consult Note (Addendum)
[facility] Face-to-Face Psychiatry Consult   Reason for Consult:  Suicide attempt  Referring Physician:  EDP Patient Identification: [name] MRN:  [id number] Principal Diagnosis: Suicide attempt [facility]) Diagnosis:  Principal Problem:   Suicide attempt [facility])   Total Time spent with patient: 30 minutes  Subjective:   Riley [name] is a 28 y.o. male patient admitted with suicide attempt by drug overdose.  HPI:   Per chart review, patient was admitted with suicide attempt by drug overdose of Seroquel, Gabapentin and Trazodone. He was recently discharged from [facility] on 2/24 after an insulin overdose. He presented with hypoglycemia at that time. On interview he reports, "The first step is the hardest for one to make. I do not want to deal with it but I do want to deal with it. I want to die but also want to live." He reports that he does not believe that he was admitted to [facility] for enough time. He was started on medication. He planned to follow up at [facility] and [facility]. He has an appointment with [facility] tomorrow. He reports frustration with receiving resources but not having adequate finances to follow through. He reports that he cannot afford his medications and has limited social support for assistance. He later reports that his friend gave him $10 to buy alcohol yesterday. BAL was negative and UDS was positive for benzodiazepines and THC on admission. He endorses SI. He denies HI or AVH. He reports a history of manic symptoms in the absence of substance abuse.   Past Psychiatric History: Alcohol abuse and MDD  Risk to Self: Suicidal Ideation: Yes-Currently Present Suicidal Intent: Yes-Currently Present Is patient at risk for suicide?: Yes Suicidal Plan?: Yes-Currently Present Specify Current Suicidal Plan: "overdose, hang myself or anything" Access to Means: Yes Specify Access to Suicidal Means: "medication and clothes" What has been your use of drugs/alcohol within the last 12 months?: "big  blunt before I came" How many times?: 2 Triggers for Past Attempts: Unpredictable, Unknown Intentional Self Injurious Behavior: None Risk to Others: Homicidal Ideation: No Thoughts of Harm to Others: No Current Homicidal Intent: No Current Homicidal Plan: No Access to Homicidal Means: No History of harm to others?: No Assessment of Violence: None Noted Does patient have access to weapons?: No Criminal Charges Pending?: No Does patient have a court date: No Prior Inpatient Therapy: Prior Inpatient Therapy: Yes Prior Therapy Dates: [facility] 12/2017 and 11/2018 Prior Therapy Facilty/Provider(s): [facility] Reason for Treatment: Depression and ETHOL Prior Outpatient Therapy: Prior Outpatient Therapy: No Does patient have an ACCT team?: No Does patient have Intensive In-House Services?  : No Does patient have [facility] services? : No Does patient have P4CC services?: No  Past Medical History:  Past Medical History:  Diagnosis Date  . Alcoholism (HCC)    History reviewed. No pertinent surgical history. Family History:  Family History  Problem Relation Age of Onset  . Hypertension Other   . Diabetes Other    Family Psychiatric  History: Mother and father-BPAD.  Social History:  Social History   Substance and Sexual Activity  Alcohol Use Yes   Comment: Pt stated "I drink  1/2 gal of 100 proof every day"     Social History   Substance and Sexual Activity  Drug Use No    Social History   Socioeconomic History  . Marital status: Single    Spouse name: Not on file  . Number of children: Not on file  . Years of education: Not on file  .  Highest education level: Not on file  Occupational History  . Not on file  Social Needs  . Financial resource strain: Not on file  . Food insecurity:    Worry: Not on file    Inability: Not on file  . Transportation needs:    Medical: Not on file    Non-medical: Not on file  Tobacco Use  . Smoking status: Current Every Day Smoker    Packs/day:  2.00    Types: Cigarettes  . Smokeless tobacco: Never Used  Substance and Sexual Activity  . Alcohol use: Yes    Comment: Pt stated "I drink  1/2 gal of 100 proof every day"  . Drug use: No  . Sexual activity: Not Currently  Lifestyle  . Physical activity:    Days per week: Not on file    Minutes per session: Not on file  . Stress: Not on file  Relationships  . Social connections:    Talks on phone: Not on file    Gets together: Not on file    Attends religious service: Not on file    Active member of club or organization: Not on file    Attends meetings of clubs or organizations: Not on file    Relationship status: Not on file  Other Topics Concern  . Not on file  Social History Narrative  . Not on file   Additional Social History: He is homeless x 2 years. He works odd jobs. He reports a history of heavy alcohol use.     Allergies:  No Known Allergies  Labs:  Results for orders placed or performed during the hospital encounter of 12/16/18 (from the past 48 hour(s))  CBG monitoring, ED     Status: None   Collection Time: 12/16/18  3:03 AM  Result Value Ref Range   Glucose-Capillary 93 70 - 99 mg/dL  Acetaminophen level     Status: Abnormal   Collection Time: 12/16/18  3:06 AM  Result Value Ref Range   Acetaminophen (Tylenol), Serum <10 (L) 10 - 30 ug/mL    Comment: (NOTE) Therapeutic concentrations vary significantly. A range of 10-30 ug/mL  may be an effective concentration for many patients. However, some  are best treated at concentrations outside of this range. Acetaminophen concentrations >150 ug/mL at 4 hours after ingestion  and >50 ug/mL at 12 hours after ingestion are often associated with  toxic reactions. Performed at [facility], [address] [place], [place] [address]   Salicylate level     Status: None   Collection Time: 12/16/18  3:06 AM  Result Value Ref Range   Salicylate Lvl <7.0 2.8 - 30.0 mg/dL    Comment: Performed at  [facility], [address], [place], [place] [address]  Ethanol     Status: None   Collection Time: 12/16/18  3:06 AM  Result Value Ref Range   Alcohol, Ethyl (B) <10 <10 mg/dL    Comment: (NOTE) Lowest detectable limit for serum alcohol is 10 mg/dL. For medical purposes only. Performed at [facility], [address] [place], [place] [address]   CBC     Status: Abnormal   Collection Time: 12/16/18  3:06 AM  Result Value Ref Range   WBC 12.8 (H) 4.0 - 10.5 K/uL   RBC 4.09 (L) 4.22 - 5.81 MIL/uL   Hemoglobin 13.6 13.0 - 17.0 g/dL   HCT 92.1 19.4 - 17.4 %   MCV 98.0 80.0 - 100.0  fL   MCH 33.3 26.0 - 34.0 pg   MCHC 33.9 30.0 - 36.0 g/dL   RDW 68.1 27.5 - 17.0 %   Platelets 254 150 - 400 K/uL   nRBC 0.0 0.0 - 0.2 %    Comment: Performed at [facility], [address], [place], [place] [address]  Comprehensive metabolic panel     Status: Abnormal   Collection Time: 12/16/18  3:06 AM  Result Value Ref Range   Sodium 136 135 - 145 mmol/L   Potassium 3.4 (L) 3.5 - 5.1 mmol/L   Chloride 103 98 - 111 mmol/L   CO2 24 22 - 32 mmol/L   Glucose, Bld 94 70 - 99 mg/dL   BUN 9 6 - 20 mg/dL   Creatinine, Ser 4.49 0.61 - 1.24 mg/dL   Calcium 8.5 (L) 8.9 - 10.3 mg/dL   Total Protein 6.4 (L) 6.5 - 8.1 g/dL   Albumin 3.6 3.5 - 5.0 g/dL   AST 35 15 - 41 U/L   ALT 58 (H) 0 - 44 U/L   Alkaline Phosphatase 75 38 - 126 U/L   Total Bilirubin 0.5 0.3 - 1.2 mg/dL   GFR calc non Af Amer >60 >60 mL/min   GFR calc Af Amer >60 >60 mL/min   Anion gap 9 5 - 15    Comment: Performed at [facility], [address], [place], [place] [address]  Magnesium     Status: None   Collection Time: 12/16/18  3:06 AM  Result Value Ref Range   Magnesium 1.9 1.7 - 2.4 mg/dL    Comment: Performed at [facility], [address] [place], [place] [address]  Acetaminophen level     Status: Abnormal   Collection Time:  12/16/18  8:12 AM  Result Value Ref Range   Acetaminophen (Tylenol), Serum <10 (L) 10 - 30 ug/mL    Comment: (NOTE) Therapeutic concentrations vary significantly. A range of 10-30 ug/mL  may be an effective concentration for many patients. However, some  are best treated at concentrations outside of this range. Acetaminophen concentrations >150 ug/mL at 4 hours after ingestion  and >50 ug/mL at 12 hours after ingestion are often associated with  toxic reactions. Performed at [facility], [address] [place], [place] [address]   Rapid urine drug screen (hospital performed)     Status: Abnormal   Collection Time: 12/16/18 12:07 PM  Result Value Ref Range   Opiates NONE DETECTED NONE DETECTED   Cocaine NONE DETECTED NONE DETECTED   Benzodiazepines POSITIVE (A) NONE DETECTED   Amphetamines NONE DETECTED NONE DETECTED   Tetrahydrocannabinol POSITIVE (A) NONE DETECTED   Barbiturates NONE DETECTED NONE DETECTED    Comment: (NOTE) DRUG SCREEN FOR MEDICAL PURPOSES ONLY.  IF CONFIRMATION IS NEEDED FOR ANY PURPOSE, NOTIFY LAB WITHIN 5 DAYS. LOWEST DETECTABLE LIMITS FOR URINE DRUG SCREEN Drug Class                     Cutoff (ng/mL) Amphetamine and metabolites    1000 Barbiturate and metabolites    200 Benzodiazepine                 200 Tricyclics and metabolites     300 Opiates and metabolites        300 Cocaine and metabolites        300 THC  50 Performed at [facility], [address], [place], [place] [address]     Current Facility-Administered Medications  Medication Dose Route Frequency Provider Last Rate Last Dose  . charcoal activated (NO SORBITOL) (ACTIDOSE-AQUA) suspension 50 g  50 g Oral Once Roxy Horseman, PA-C       Current Outpatient Medications  Medication Sig Dispense Refill  . gabapentin (NEURONTIN) 400 MG capsule Take 1 capsule (400 mg total) by mouth 3 (three) times daily. 90 capsule 1  .  QUEtiapine (SEROQUEL) 100 MG tablet Take 1 tablet (100 mg total) by mouth 3 (three) times daily. 90 tablet 1  . traZODone (DESYREL) 100 MG tablet Take 1 tablet (100 mg total) by mouth at bedtime as needed for sleep. 30 tablet 1  . vortioxetine HBr (TRINTELLIX) 10 MG TABS tablet Take 1 tablet (10 mg total) by mouth daily. 30 tablet 2    Musculoskeletal: Strength & Muscle Tone: within normal limits Gait & Station: normal Patient leans: N/A  Psychiatric Specialty Exam: Physical Exam  Nursing note and vitals reviewed. Constitutional: He is oriented to person, place, and time. He appears well-developed and well-nourished.  HENT:  Head: Normocephalic and atraumatic.  Neck: Normal range of motion.  Respiratory: Effort normal.  Musculoskeletal: Normal range of motion.  Neurological: He is alert and oriented to person, place, and time.  Psychiatric: His speech is normal and behavior is normal. Cognition and memory are normal. He expresses impulsivity. He exhibits a depressed mood. He expresses suicidal ideation. He expresses suicidal plans.    Review of Systems  Psychiatric/Behavioral: Positive for depression, substance abuse and suicidal ideas. Negative for hallucinations.  All other systems reviewed and are negative.   Blood pressure 112/69, pulse 73, temperature 98 F (36.7 C), temperature source Oral, resp. rate 16, height  (1.702 m), weight 72.6 kg, SpO2 98 %.Body mass index is 25.06 kg/m.  General Appearance: Fairly Groomed, young, Caucasian male, wearing paper hospital scrubs who is lying in bed. NAD.   Eye Contact:  Good  Speech:  Clear and Coherent and Normal Rate  Volume:  Normal  Mood:  Depressed  Affect:  Constricted  Thought Process:  Goal Directed, Linear and Descriptions of Associations: Intact  Orientation:  Full (Time, Place, and Person)  Thought Content:  Logical  Suicidal Thoughts:  Yes.  with intent/plan  Homicidal Thoughts:  No  Memory:  Immediate;    Good Recent;   Good Remote;   Good  Judgement:  Poor  Insight:  Fair  Psychomotor Activity:  Normal  Concentration:  Concentration: Good and Attention Span: Good  Recall:  Good  Fund of Knowledge:  Good  Language:  Good  Akathisia:  No  Handed:  Right  AIMS (if indicated):   N/A  Assets:  Communication Skills Desire for Improvement Physical Health  ADL's:  Intact  Cognition:  WNL  Sleep:   N/A   Assessment:  OKLEY MAGNUSSEN is a 28 y.o. male who was admitted with suicide attempt by drug overdose. He reports depressed mood due to multiple psychosocial stressors. He warrants inpatient psychiatric hospitalization for stabilization and treatment.   Treatment Plan Summary: Daily contact with patient to assess and evaluate symptoms and progress in treatment and Medication management    Disposition: Recommend psychiatric Inpatient admission when medically cleared.  Cherly Beach, DO 12/17/2018 11:22 AM

## 2018-12-17 NOTE — ED Notes (Signed)
Pt calm and cooperative - resting in bed. Reports thoughts of suicide. Has problem with homelessness and getting the money to get transportation to get his medications at [facility]. States that he also does not have $12 to purchase the medications. A friend gave him $10, which he spent on food and alcohol.

## 2018-12-17 NOTE — ED Notes (Signed)
Pt reported rash on right forearm. States that he woke up with it. Reported to Dr. Sharma Covert and Feliz Beam NP, who examined it.

## 2018-12-17 NOTE — ED Notes (Signed)
Pt alert and oriented. Pt denies any pain or discomfort. Pt denies any si, hi, avh at this time. Pt calm and cooperative. Pt resting room, will continue to monitor.

## 2018-12-18 DIAGNOSIS — T50902A Poisoning by unspecified drugs, medicaments and biological substances, intentional self-harm, initial encounter: Secondary | ICD-10-CM

## 2018-12-18 NOTE — ED Notes (Signed)
Pt d/c home per MD order. Discharge summary reviewed with pt, pt verbalizes understanding. Denies SI/HI/AVH. Personal property returned. Pt signed e-signature. Bus pass provided per pt request. Ambulatory off unit.

## 2018-12-18 NOTE — Consult Note (Signed)
[facility] Psych ED Discharge  12/18/2018 11:41 AM Riley [name]  MRN:  [id number] Principal Problem: Drug overdose Discharge Diagnoses: Principal Problem:   Drug overdose Active Problems:   Suicide attempt [facility])   Subjective:  Per chart review, patient was admitted with suicide attempt by overdose of Seroquel, Neurontin and Trazodone following discharge from [facility]. Riley [name] was seen yesterday and reported several psychosocial stressors including lack of finances and housing which lead to his overdose. He continued to endorse SI. Today, he denies that he was trying to end his life yesterday and reports taking an amount of medication that would have just caused him to sleep. He reports feeling better today. He shows good insight into his situation. He reports that he "cannot give up at life everything" he does not want to deal with stressors or barriers. He reports that he has healthy coping skills but he needs to use them more often. He believes that it would be helpful to speak to a therapist. He is homeless but he reports adequate social support. He plans to follow up at [facility].   Total Time spent with patient: 30 minutes  Past Psychiatric History: None per chart review.   Past Medical History:  Past Medical History:  Diagnosis Date  . Alcoholism (HCC)    History reviewed. No pertinent surgical history. Family History:  Family History  Problem Relation Age of Onset  . Hypertension Other   . Diabetes Other    Family Psychiatric  History:  Social History:  Social History   Substance and Sexual Activity  Alcohol Use Yes   Comment: Pt stated "I drink  1/2 gal of 100 proof every day"     Social History   Substance and Sexual Activity  Drug Use No    Social History   Socioeconomic History  . Marital status: Single    Spouse name: Not on file  . Number of children: Not on file  . Years of education: Not on file  . Highest education level: Not on file  Occupational History  . Not  on file  Social Needs  . Financial resource strain: Not on file  . Food insecurity:    Worry: Not on file    Inability: Not on file  . Transportation needs:    Medical: Not on file    Non-medical: Not on file  Tobacco Use  . Smoking status: Current Every Day Smoker    Packs/day: 2.00    Types: Cigarettes  . Smokeless tobacco: Never Used  Substance and Sexual Activity  . Alcohol use: Yes    Comment: Pt stated "I drink  1/2 gal of 100 proof every day"  . Drug use: No  . Sexual activity: Not Currently  Lifestyle  . Physical activity:    Days per week: Not on file    Minutes per session: Not on file  . Stress: Not on file  Relationships  . Social connections:    Talks on phone: Not on file    Gets together: Not on file    Attends religious service: Not on file    Active member of club or organization: Not on file    Attends meetings of clubs or organizations: Not on file    Relationship status: Not on file  Other Topics Concern  . Not on file  Social History Narrative  . Not on file    Has this patient used any form of tobacco in the last 30 days? (Cigarettes, Smokeless Tobacco, Cigars,  and/or Pipes) Prescription not provided because: N/A  Current Medications: Current Facility-Administered Medications  Medication Dose Route Frequency Provider Last Rate Last Dose  . charcoal activated (NO SORBITOL) (ACTIDOSE-AQUA) suspension 50 g  50 g Oral Once Roxy Horseman, PA-C      . diphenhydrAMINE (BENADRYL) capsule 50 mg  50 mg Oral Q8H PRN Money, Gerlene Burdock, FNP   50 mg at 12/17/18 1351   Current Outpatient Medications  Medication Sig Dispense Refill  . gabapentin (NEURONTIN) 400 MG capsule Take 1 capsule (400 mg total) by mouth 3 (three) times daily. 90 capsule 1  . QUEtiapine (SEROQUEL) 100 MG tablet Take 1 tablet (100 mg total) by mouth 3 (three) times daily. 90 tablet 1  . traZODone (DESYREL) 100 MG tablet Take 1 tablet (100 mg total) by mouth at bedtime as needed for sleep.  30 tablet 1  . vortioxetine HBr (TRINTELLIX) 10 MG TABS tablet Take 1 tablet (10 mg total) by mouth daily. 30 tablet 2   PTA Medications: (Not in a hospital admission)   Musculoskeletal: Strength & Muscle Tone: within normal limits Gait & Station: normal Patient leans: N/A  Psychiatric Specialty Exam: Physical Exam  Nursing note and vitals reviewed. Constitutional: He is oriented to person, place, and time. He appears well-developed and well-nourished.  HENT:  Head: Normocephalic and atraumatic.  Neck: Normal range of motion.  Respiratory: Effort normal.  Musculoskeletal: Normal range of motion.  Neurological: He is alert and oriented to person, place, and time.  Psychiatric: He has a normal mood and affect. His speech is normal and behavior is normal. Judgment and thought content normal. Cognition and memory are normal.    Review of Systems  Psychiatric/Behavioral: Positive for substance abuse. Negative for hallucinations and suicidal ideas.  All other systems reviewed and are negative.   Blood pressure 103/71, pulse 68, temperature 97.8 F (36.6 C), temperature source Oral, resp. rate 16, height 5\' 7"  (1.702 m), weight 72.6 kg, SpO2 97 %.Body mass index is 25.06 kg/m.  General Appearance: Fairly Groomed, young, Caucasian male, wearing paper hospital scrubs who is lying in bed. NAD.   Eye Contact:  Good  Speech:  Clear and Coherent and Normal Rate  Volume:  Normal  Mood:  "Better"  Affect:  Appropriate and Congruent  Thought Process:  Goal Directed, Linear and Descriptions of Associations: Intact  Orientation:  Full (Time, Place, and Person)  Thought Content:  Logical  Suicidal Thoughts:  No  Homicidal Thoughts:  No  Memory:  Immediate;   Good Recent;   Good Remote;   Good  Judgement:  Fair  Insight:  Fair  Psychomotor Activity:  Normal  Concentration:  Concentration: Good and Attention Span: Good  Recall:  Good  Fund of Knowledge:  Good  Language:  Good   Akathisia:  No  Handed:  Right  AIMS (if indicated):   N/A  Assets:  Communication Skills Desire for Improvement Resilience Social Support  ADL's:  Intact  Cognition:  WNL  Sleep:   N/A     Demographic Factors:  Male, Adolescent or young adult, Caucasian, Low socioeconomic status, Living alone and Unemployed  Loss Factors: Financial problems/change in socioeconomic status  Historical Factors: Prior suicide attempts and Impulsivity  Risk Reduction Factors:   Positive social support  Continued Clinical Symptoms:  More than one psychiatric diagnosis Previous Psychiatric Diagnoses and Treatments  Cognitive Features That Contribute To Risk:  None    Suicide Risk:  Minimal: No identifiable suicidal ideation.  Patients presenting with  no risk factors but with morbid ruminations; may be classified as minimal risk based on the severity of the depressive symptoms  Assessment:  Riley [name] was a 28 y.o. male who was admitted with drug overdose of Seroquel, Neurontin and Trazodone following discharge from [facility] in the setting of psychosocial stressors. Today, he shows good insight into his situation. He understands that he must be accountable for his actions and that he must take the steps to improve his mental health as well as using the resources provided to him. He denies SI, HI or AVH. He denies that his overdose was a suicide attempt. He is future oriented. He is able to safety plan. He feels safe for discharge.   Plan Of Care/Follow-up recommendations:  -Continue psychotropic medications as prescribed. -Follow up with [facility] for substance abuse treatment.  -Patient will be provided with information for [facility] [place] for therapy services and medication management.   Disposition: Discharge home. Cherly Beach, DO 12/18/2018, 11:41 AM

## 2018-12-18 NOTE — BH Assessment (Signed)
[facility] Assessment Progress Note  Per Juanetta Beets, DO, this pt does not require psychiatric hospitalization at this time.  Pt is to be discharged from [facility] with recommendation to follow up with [facility] [place] for his mental health needs.  He is also to be provided with referral information for area substance abuse treatment providers.  These have been included in pt's discharge instructions.  Pt's nurse, Morrie Sheldon, has been notified.  Doylene Canning, MA Triage Specialist [phone]

## 2018-12-18 NOTE — Discharge Instructions (Signed)
For your behavioral health needs you are advised to follow up with [facility].  New patients are seen at their walk-in clinic.  Walk-in hours are Monday - Friday from 8:00 am - 12:00 pm, and from 1:00 pm - 3:00 pm.  Walk-in patients are seen on a first come, first served basis, so try to arrive as early as possible for the best chance of being seen the same day.  There is an initial fee of $22.50:       [facility]      [address]      [place], [place] [address]      [phone]  To help you maintain a sober lifestyle, a substance abuse treatment program may be beneficial to you.  Contact one of the following facilities at your earliest opportunity to ask about enrolling:  RESIDENTIAL PROGRAMS:       [facility]      [address] [place], [place] [address]      [phone]       [facility]      [address] [place], [place] [address]      [phone]       [facility]      [address]      [place], [place] [address]      [phone]  OUTPATIENT PROGRAMS:       [facility]      [address], [place] [address]      [phone]      New patients are seen at the walk-in clinic every Tuesday from 9:00 am - 12:00 pm

## 2019-02-06 ENCOUNTER — Encounter: Payer: Self-pay | Admitting: *Deleted

## 2019-02-06 NOTE — Congregational Nurse Program (Signed)
COVID 19 HOTEL SCREEN  No issues to report. 

## 2019-02-13 NOTE — Progress Notes (Signed)
COVID Hotel Screening performed. Temperature, PHQ-9, and need for medical care and medications assessed. Patient reports being on Seraquel and is not able to get it. Request assistance. Referral made to [facility].  Carlyle Basques RN MSN

## 2019-02-20 ENCOUNTER — Other Ambulatory Visit: Payer: Self-pay

## 2019-02-20 ENCOUNTER — Emergency Department (HOSPITAL_COMMUNITY)
Admission: EM | Admit: 2019-02-20 | Discharge: 2019-02-21 | Disposition: A | Discharge status: Other Acute Inpatient Hospital | Payer: Self-pay | Attending: Emergency Medicine | Admitting: Emergency Medicine

## 2019-02-20 ENCOUNTER — Encounter (HOSPITAL_COMMUNITY): Payer: Self-pay

## 2019-02-20 DIAGNOSIS — F332 Major depressive disorder, recurrent severe without psychotic features: Secondary | ICD-10-CM | POA: Insufficient documentation

## 2019-02-20 DIAGNOSIS — T50902A Poisoning by unspecified drugs, medicaments and biological substances, intentional self-harm, initial encounter: Secondary | ICD-10-CM

## 2019-02-20 DIAGNOSIS — Z79899 Other long term (current) drug therapy: Secondary | ICD-10-CM | POA: Insufficient documentation

## 2019-02-20 DIAGNOSIS — F1014 Alcohol abuse with alcohol-induced mood disorder: Secondary | ICD-10-CM | POA: Insufficient documentation

## 2019-02-20 DIAGNOSIS — F10229 Alcohol dependence with intoxication, unspecified: Secondary | ICD-10-CM | POA: Insufficient documentation

## 2019-02-20 DIAGNOSIS — F322 Major depressive disorder, single episode, severe without psychotic features: Secondary | ICD-10-CM | POA: Diagnosis present

## 2019-02-20 DIAGNOSIS — E876 Hypokalemia: Secondary | ICD-10-CM | POA: Insufficient documentation

## 2019-02-20 DIAGNOSIS — F17210 Nicotine dependence, cigarettes, uncomplicated: Secondary | ICD-10-CM | POA: Insufficient documentation

## 2019-02-20 DIAGNOSIS — F1220 Cannabis dependence, uncomplicated: Secondary | ICD-10-CM | POA: Insufficient documentation

## 2019-02-20 DIAGNOSIS — T50992A Poisoning by other drugs, medicaments and biological substances, intentional self-harm, initial encounter: Secondary | ICD-10-CM | POA: Insufficient documentation

## 2019-02-20 DIAGNOSIS — F10929 Alcohol use, unspecified with intoxication, unspecified: Secondary | ICD-10-CM

## 2019-02-20 DIAGNOSIS — F1020 Alcohol dependence, uncomplicated: Secondary | ICD-10-CM | POA: Insufficient documentation

## 2019-02-20 LAB — SALICYLATE LEVEL: Salicylate Lvl: <7 mg/dL (ref 2.8–30.0)

## 2019-02-20 LAB — CBC WITH DIFFERENTIAL/PLATELET
Abs Immature Granulocytes: 0.02 10*3/uL (ref 0.00–0.07)
Basophils Absolute: 0.1 10*3/uL (ref 0.0–0.1)
Basophils Relative: 1 %
Eosinophils Absolute: 0.1 10*3/uL (ref 0.0–0.5)
Eosinophils Relative: 2 %
HCT: 46.7 % (ref 39.0–52.0)
Hemoglobin: 15.8 g/dL (ref 13.0–17.0)
Immature Granulocytes: 0 %
Lymphocytes Relative: 49 %
Lymphs Abs: 2.9 10*3/uL (ref 0.7–4.0)
MCH: 33.3 pg (ref 26.0–34.0)
MCHC: 33.8 g/dL (ref 30.0–36.0)
MCV: 98.3 fL (ref 80.0–100.0)
Monocytes Absolute: 0.2 10*3/uL (ref 0.1–1.0)
Monocytes Relative: 3 %
Neutro Abs: 2.7 10*3/uL (ref 1.7–7.7)
Neutrophils Relative %: 45 %
Platelets: 241 10*3/uL (ref 150–400)
RBC: 4.75 MIL/uL (ref 4.22–5.81)
RDW: 12.7 % (ref 11.5–15.5)
WBC: 5.9 10*3/uL (ref 4.0–10.5)
nRBC: 0 % (ref 0.0–0.2)

## 2019-02-20 LAB — ETHANOL: Alcohol, Ethyl (B): 341 mg/dL (ref <10)

## 2019-02-20 LAB — COMPREHENSIVE METABOLIC PANEL
ALT: 17 U/L (ref 0–44)
AST: 24 U/L (ref 15–41)
Albumin: 4.4 g/dL (ref 3.5–5.0)
Alkaline Phosphatase: 95 U/L (ref 38–126)
Anion gap: 12 (ref 5–15)
BUN: 9 mg/dL (ref 6–20)
CO2: 22 mmol/L (ref 22–32)
Calcium: 8.5 mg/dL — ABNORMAL LOW (ref 8.9–10.3)
Chloride: 106 mmol/L (ref 98–111)
Creatinine, Ser: 0.82 mg/dL (ref 0.61–1.24)
GFR calc Af Amer: >60 mL/min (ref >60)
GFR calc non Af Amer: >60 mL/min (ref >60)
Glucose, Bld: 142 mg/dL — ABNORMAL HIGH (ref 70–99)
Potassium: 3.1 mmol/L — ABNORMAL LOW (ref 3.5–5.1)
Sodium: 140 mmol/L (ref 135–145)
Total Bilirubin: 0.6 mg/dL (ref 0.3–1.2)
Total Protein: 8.2 g/dL — ABNORMAL HIGH (ref 6.5–8.1)

## 2019-02-20 LAB — CBG MONITORING, ED: Glucose-Capillary: 113 mg/dL — ABNORMAL HIGH (ref 70–99)

## 2019-02-20 LAB — ACETAMINOPHEN LEVEL: Acetaminophen (Tylenol), Serum: <10 ug/mL — ABNORMAL LOW (ref 10–30)

## 2019-02-20 MED ORDER — SODIUM CHLORIDE 0.9 % IV BOLUS
1000.0000 mL | Freq: Once | INTRAVENOUS | Status: AC
  Administered 2019-02-20: 20:00:00 1000 mL via INTRAVENOUS

## 2019-02-20 MED ORDER — SODIUM CHLORIDE 0.9 % IV BOLUS
1000.0000 mL | Freq: Once | INTRAVENOUS | Status: AC
  Administered 2019-02-20: 22:00:00 1000 mL via INTRAVENOUS

## 2019-02-20 NOTE — ED Provider Notes (Signed)
[facility]   CSN: [id number] Arrival date & time: 02/20/19  1939    History   Chief Complaint Chief Complaint  Patient presents with  . Drug Overdose    HPI Robby SermonRobert G Giannetti is a 28 y.o. male.     HPI  28 year old male presents with altered mental status and presumed drug overdose.  [facility] found [place] of [employer] and was unresponsive.  There were approximately 25 empty Seroquel pill packets.  These were 100 mg each.  A witness stated the patient also took a "swig of vodka".  Witnesses told EMS he had a seizure, no seizure like activity with EMS. The patient is altered at this time and unable to provide further history.  Chart review shows that he has a chronic history of alcohol abuse and chronic history of suicide attempts.  Past Medical History:  Diagnosis Date  . Alcoholism [facility](HCC)     Patient Active Problem List   Diagnosis Date Noted  . Alcohol use disorder, severe, dependence (HCC) 12/11/2018  . Suicide attempt (HCC)   . Overdose 12/03/2018  . Tachypnea   . Drug overdose   . Methadone overdose (HCC) 06/11/2018  . Alcohol abuse with alcohol-induced mood disorder (HCC) 12/30/2017  . Severe recurrent major depression without psychotic features (HCC) 12/21/2017  . MDD (major depressive disorder), severe (HCC) 12/13/2017  . Alcoholism (HCC) 12/12/2017  . Hypokalemia 12/12/2017  . Suicidal ideations 12/12/2017  . Hypoglycemia due to insulin 12/12/2017  . Alcohol abuse with intoxication (HCC) 12/12/2017  . Hypoglycemia 12/12/2017  . Insulin overdose     No past surgical history on file.      Home Medications    Prior to Admission medications   Medication Sig Start Date End Date Taking? Authorizing Provider  gabapentin (NEURONTIN) 400 MG capsule Take 1 capsule (400 mg total) by mouth 3 (three) times daily. 12/15/18   Malvin JohnsFarah, Brian, MD  QUEtiapine (SEROQUEL) 100 MG tablet Take 1 tablet (100 mg total) by mouth 3 (three)  times daily. 12/15/18   Malvin JohnsFarah, Brian, MD  traZODone (DESYREL) 100 MG tablet Take 1 tablet (100 mg total) by mouth at bedtime as needed for sleep. 12/15/18   Malvin JohnsFarah, Brian, MD  vortioxetine HBr (TRINTELLIX) 10 MG TABS tablet Take 1 tablet (10 mg total) by mouth daily. 12/16/18   Malvin JohnsFarah, Brian, MD    Family History Family History  Problem Relation Age of Onset  . Hypertension Other   . Diabetes Other     Social History Social History   Tobacco Use  . Smoking status: Current Every Day Smoker    Packs/day: 2.00    Types: Cigarettes  . Smokeless tobacco: Never Used  Substance Use Topics  . Alcohol use: Yes    Comment: Pt stated "I drink  1/2 gal of 100 proof every day"  . Drug use: No     Allergies   Patient has no known allergies.   Review of Systems Review of Systems  Unable to perform ROS: Mental status change     Physical Exam Updated Vital Signs BP 128/82 (BP Location: Right Arm)   Pulse (!) 124   Temp (!) 97.5 F (36.4 C) (Oral)   Resp 17   SpO2 99%   Physical Exam Vitals signs and nursing note reviewed.  Constitutional:      Appearance: He is well-developed.  HENT:     Head: Normocephalic and atraumatic.     Right Ear: External ear normal.  Left Ear: External ear normal.     Nose: Nose normal.  Eyes:     General:        Right eye: No discharge.        Left eye: No discharge.     Comments: Miotic pupils bilaterally. nystagmus  Neck:     Musculoskeletal: Neck supple.  Cardiovascular:     Rate and Rhythm: Regular rhythm. Tachycardia present.     Heart sounds: Normal heart sounds.  Pulmonary:     Effort: Pulmonary effort is normal.     Breath sounds: Normal breath sounds.  Abdominal:     Palpations: Abdomen is soft.     Tenderness: There is no abdominal tenderness.  Skin:    General: Skin is warm and dry.  Neurological:     Mental Status: He is alert.     Comments: Somnolent.  He appears altered when he does open his eyes to voice and light  touch.  He will grip both of my hands but does not follow commands well enough to complete further neuro testing.  Psychiatric:        Mood and Affect: Mood is not anxious.      ED Treatments / Results  Labs (all labs ordered are listed, but only abnormal results are displayed) Labs Reviewed  ACETAMINOPHEN LEVEL - Abnormal; Notable for the following components:      Result Value   Acetaminophen (Tylenol), Serum <10 (*)    All other components within normal limits  COMPREHENSIVE METABOLIC PANEL - Abnormal; Notable for the following components:   Potassium 3.1 (*)    Glucose, Bld 142 (*)    Calcium 8.5 (*)    Total Protein 8.2 (*)    All other components within normal limits  ETHANOL - Abnormal; Notable for the following components:   Alcohol, Ethyl (B) 341 (*)    All other components within normal limits  CBG MONITORING, ED - Abnormal; Notable for the following components:   Glucose-Capillary 113 (*)    All other components within normal limits  SALICYLATE LEVEL  CBC WITH DIFFERENTIAL/PLATELET  URINALYSIS, ROUTINE W REFLEX MICROSCOPIC  RAPID URINE DRUG SCREEN, HOSP PERFORMED  ACETAMINOPHEN LEVEL    EKG EKG Interpretation  Date/Time:  Friday Feb 20 2019 19:46:04 EDT Ventricular Rate:  150 PR Interval:    QRS Duration: 91 QT Interval:  272 QTC Calculation: 430 R Axis:   98 Text Interpretation:  Sinus tachycardia Borderline right axis deviation Borderline T abnormalities, inferior leads rate is faster compared to Feb 2020 Confirmed by Pricilla Loveless [phone]) on 02/20/2019 7:54:10 PM   Radiology No results found.  Procedures Procedures (including critical care time)  Medications Ordered in ED Medications  sodium chloride 0.9 % bolus 1,000 mL (0 mLs Intravenous Stopped 02/20/19 2212)  sodium chloride 0.9 % bolus 1,000 mL (1,000 mLs Intravenous New Bag/Given 02/20/19 2213)     Initial Impression / Assessment and Plan / ED Course  I have reviewed the triage vital signs  and the nursing notes.  Pertinent labs & imaging results that were available during my care of the patient were reviewed by me and considered in my medical decision making (see chart for details).        Patient presents with a recurrent drug overdose.  He has had this multiple times before.  He is tachycardic but QRS and QTc are within normal limits.  He was given IV fluids and watched on the cardiac monitor.  I did discuss with  poison control, they recommend at least 6-8 hours of monitoring and a repeat Tylenol level 4 hours after presentation.  Monitor for CNS depression but otherwise will need to sober on his own.  He is protecting his airway.  His heart rate has improved.  He has been given IV fluids.  Care to Dr. Preston Fleeting  Final Clinical Impressions(s) / ED Diagnoses   Final diagnoses:  Intentional drug overdose, initial encounter [facility])  Alcoholic intoxication with complication [facility])    ED Discharge Orders    None       Pricilla Loveless, MD 02/20/19 2354

## 2019-02-20 NOTE — ED Notes (Signed)
Pt urinated in the bed

## 2019-02-20 NOTE — Progress Notes (Signed)
COVID Hotel Screening performed. Temperature, PHQ-9, and need for medical care and medications assessed. No additional needs assessed at this time.  Bryler Dibble RN MSN 

## 2019-02-20 NOTE — ED Notes (Signed)
Bed: WA08 Expected date:  Expected time:  Means of arrival:  Comments: 22 M od seroquel

## 2019-02-20 NOTE — ED Triage Notes (Signed)
Pt [place] EMS f[place] [employer]. Pt was apneic and agonal respiration, + pulse. Pt pupil pinpoint and was given 0.2mg  of narcan. Pt was bagged. RR improved and began spontaneous breathing Pt had 10-20 packs of 100mg  of Seroquel approx. 2g or greater suspected of Seroquel overdose. Witness report SZ like activity. EMS reports no witnessed SZ enroute.    BP 112/86,   HR 130, RR 12-20, ETCO2 24-34, CBG 192  18G left AC

## 2019-02-21 ENCOUNTER — Inpatient Hospital Stay (HOSPITAL_COMMUNITY): Admit: 2019-02-21 | Payer: No Typology Code available for payment source

## 2019-02-21 ENCOUNTER — Inpatient Hospital Stay (HOSPITAL_COMMUNITY)
Admission: AD | Admit: 2019-02-21 | Discharge: 2019-02-23 | DRG: 885 | Disposition: A | Discharge status: Home / Self Care | Payer: No Typology Code available for payment source | Attending: Psychiatry | Admitting: Psychiatry

## 2019-02-21 ENCOUNTER — Other Ambulatory Visit: Payer: Self-pay

## 2019-02-21 ENCOUNTER — Encounter (HOSPITAL_COMMUNITY): Payer: Self-pay

## 2019-02-21 DIAGNOSIS — Z590 Homelessness: Secondary | ICD-10-CM

## 2019-02-21 DIAGNOSIS — Z560 Unemployment, unspecified: Secondary | ICD-10-CM

## 2019-02-21 DIAGNOSIS — F332 Major depressive disorder, recurrent severe without psychotic features: Secondary | ICD-10-CM | POA: Diagnosis present

## 2019-02-21 DIAGNOSIS — T50904A Poisoning by unspecified drugs, medicaments and biological substances, undetermined, initial encounter: Secondary | ICD-10-CM | POA: Diagnosis not present

## 2019-02-21 DIAGNOSIS — Z915 Personal history of self-harm: Secondary | ICD-10-CM | POA: Diagnosis not present

## 2019-02-21 DIAGNOSIS — F1024 Alcohol dependence with alcohol-induced mood disorder: Secondary | ICD-10-CM | POA: Diagnosis not present

## 2019-02-21 LAB — ACETAMINOPHEN LEVEL: Acetaminophen (Tylenol), Serum: <10 ug/mL — ABNORMAL LOW (ref 10–30)

## 2019-02-21 LAB — URINALYSIS, ROUTINE W REFLEX MICROSCOPIC
Bilirubin Urine: NEGATIVE
Glucose, UA: NEGATIVE mg/dL
Hgb urine dipstick: NEGATIVE
Ketones, ur: NEGATIVE mg/dL
Leukocytes,Ua: NEGATIVE
Nitrite: NEGATIVE
Protein, ur: NEGATIVE mg/dL
Specific Gravity, Urine: 1.005 (ref 1.005–1.030)
pH: 5 (ref 5.0–8.0)

## 2019-02-21 LAB — RAPID URINE DRUG SCREEN, HOSP PERFORMED
Amphetamines: NOT DETECTED
Barbiturates: NOT DETECTED
Benzodiazepines: NOT DETECTED
Cocaine: NOT DETECTED
Opiates: NOT DETECTED
Tetrahydrocannabinol: POSITIVE — AB

## 2019-02-21 MED ORDER — CHLORDIAZEPOXIDE HCL 25 MG PO CAPS: 25.0000 mg | ORAL_CAPSULE | Freq: Every day | ORAL | Status: DC

## 2019-02-21 MED ORDER — ONDANSETRON 4 MG PO TBDP: 4.0000 mg | ORAL_TABLET | Freq: Four times a day (QID) | ORAL | Status: DC | PRN

## 2019-02-21 MED ORDER — ONDANSETRON HCL 4 MG PO TABS: 4.0000 mg | ORAL_TABLET | Freq: Three times a day (TID) | ORAL | Status: DC | PRN

## 2019-02-21 MED ORDER — HYDROXYZINE HCL 25 MG PO TABS: 25.0000 mg | ORAL_TABLET | Freq: Four times a day (QID) | ORAL | Status: DC | PRN

## 2019-02-21 MED ORDER — ALUM & MAG HYDROXIDE-SIMETH 200-200-20 MG/5ML PO SUSP: 30.0000 mL | Freq: Four times a day (QID) | ORAL | Status: DC | PRN

## 2019-02-21 MED ORDER — CHLORDIAZEPOXIDE HCL 25 MG PO CAPS
25.0000 mg | ORAL_CAPSULE | Freq: Three times a day (TID) | ORAL | Status: AC
  Administered 2019-02-22 – 2019-02-23 (×2): 25 mg via ORAL
  Filled 2019-02-21 (×2): qty 1

## 2019-02-21 MED ORDER — CHLORDIAZEPOXIDE HCL 25 MG PO CAPS
25.0000 mg | ORAL_CAPSULE | Freq: Four times a day (QID) | ORAL | Status: DC
  Administered 2019-02-21 (×3): 25 mg via ORAL
  Filled 2019-02-21 (×3): qty 1

## 2019-02-21 MED ORDER — THIAMINE HCL 100 MG/ML IJ SOLN
100.0000 mg | Freq: Once | INTRAMUSCULAR | Status: AC
  Administered 2019-02-21: 10:00:00 100 mg via INTRAMUSCULAR
  Filled 2019-02-21: qty 2

## 2019-02-21 MED ORDER — GABAPENTIN 400 MG PO CAPS
400.0000 mg | ORAL_CAPSULE | Freq: Three times a day (TID) | ORAL | Status: DC
  Filled 2019-02-21 (×7): qty 1

## 2019-02-21 MED ORDER — TRAZODONE HCL 100 MG PO TABS
100.0000 mg | ORAL_TABLET | Freq: Every evening | ORAL | Status: DC | PRN
  Administered 2019-02-21: 23:00:00 100 mg via ORAL
  Filled 2019-02-21: qty 1

## 2019-02-21 MED ORDER — CHLORDIAZEPOXIDE HCL 25 MG PO CAPS
25.0000 mg | ORAL_CAPSULE | Freq: Four times a day (QID) | ORAL | Status: DC | PRN
  Administered 2019-02-22: 25 mg via ORAL
  Filled 2019-02-21: qty 1

## 2019-02-21 MED ORDER — POTASSIUM CHLORIDE CRYS ER 20 MEQ PO TBCR
40.0000 meq | EXTENDED_RELEASE_TABLET | Freq: Once | ORAL | Status: AC
  Administered 2019-02-21: 40 meq via ORAL
  Filled 2019-02-21: qty 2

## 2019-02-21 MED ORDER — GABAPENTIN 400 MG PO CAPS
400.0000 mg | ORAL_CAPSULE | Freq: Three times a day (TID) | ORAL | Status: DC
  Administered 2019-02-21 (×2): 400 mg via ORAL
  Filled 2019-02-21 (×2): qty 1

## 2019-02-21 MED ORDER — VORTIOXETINE HBR 5 MG PO TABS
10.0000 mg | ORAL_TABLET | Freq: Every day | ORAL | Status: DC
  Administered 2019-02-21: 10 mg via ORAL
  Filled 2019-02-21 (×2): qty 2

## 2019-02-21 MED ORDER — ACETAMINOPHEN 325 MG PO TABS: 650.0000 mg | ORAL_TABLET | ORAL | Status: DC | PRN

## 2019-02-21 MED ORDER — ADULT MULTIVITAMIN W/MINERALS CH
1.0000 | ORAL_TABLET | Freq: Every day | ORAL | Status: DC
  Administered 2019-02-21: 10:00:00 1 via ORAL
  Filled 2019-02-21: qty 1

## 2019-02-21 MED ORDER — VORTIOXETINE HBR 10 MG PO TABS
10.0000 mg | ORAL_TABLET | Freq: Every day | ORAL | Status: DC
  Administered 2019-02-22 – 2019-02-23 (×2): 10 mg via ORAL
  Filled 2019-02-21: qty 7
  Filled 2019-02-21: qty 1
  Filled 2019-02-21: qty 7
  Filled 2019-02-21 (×2): qty 1

## 2019-02-21 MED ORDER — ADULT MULTIVITAMIN W/MINERALS CH
1.0000 | ORAL_TABLET | Freq: Every day | ORAL | Status: DC
  Administered 2019-02-22 – 2019-02-23 (×2): 1 via ORAL
  Filled 2019-02-21 (×5): qty 1

## 2019-02-21 MED ORDER — LOPERAMIDE HCL 2 MG PO CAPS: 2.0000 mg | ORAL_CAPSULE | ORAL | Status: DC | PRN

## 2019-02-21 MED ORDER — MAGNESIUM HYDROXIDE 400 MG/5ML PO SUSP: 30.0000 mL | Freq: Every day | ORAL | Status: DC | PRN

## 2019-02-21 MED ORDER — CHLORDIAZEPOXIDE HCL 25 MG PO CAPS: 25.0000 mg | ORAL_CAPSULE | ORAL | Status: DC

## 2019-02-21 MED ORDER — NICOTINE 21 MG/24HR TD PT24
21.0000 mg | MEDICATED_PATCH | Freq: Every day | TRANSDERMAL | Status: DC
  Filled 2019-02-21: qty 1

## 2019-02-21 MED ORDER — CHLORDIAZEPOXIDE HCL 25 MG PO CAPS: 25.0000 mg | ORAL_CAPSULE | Freq: Three times a day (TID) | ORAL | Status: DC

## 2019-02-21 MED ORDER — CHLORDIAZEPOXIDE HCL 25 MG PO CAPS
25.0000 mg | ORAL_CAPSULE | Freq: Four times a day (QID) | ORAL | Status: AC
  Administered 2019-02-21 – 2019-02-22 (×2): 25 mg via ORAL
  Filled 2019-02-21 (×2): qty 1

## 2019-02-21 MED ORDER — NICOTINE 21 MG/24HR TD PT24
21.0000 mg | MEDICATED_PATCH | Freq: Every day | TRANSDERMAL | Status: DC
  Filled 2019-02-21 (×5): qty 1

## 2019-02-21 MED ORDER — TRAZODONE HCL 100 MG PO TABS: 100.0000 mg | ORAL_TABLET | Freq: Every evening | ORAL | Status: DC | PRN

## 2019-02-21 MED ORDER — CHLORDIAZEPOXIDE HCL 25 MG PO CAPS: 25.0000 mg | ORAL_CAPSULE | Freq: Four times a day (QID) | ORAL | Status: DC | PRN

## 2019-02-21 MED ORDER — VITAMIN B-1 100 MG PO TABS
100.0000 mg | ORAL_TABLET | Freq: Every day | ORAL | Status: DC
  Administered 2019-02-22 – 2019-02-23 (×2): 100 mg via ORAL
  Filled 2019-02-21 (×5): qty 1

## 2019-02-21 MED ORDER — VITAMIN B-1 100 MG PO TABS: 100.0000 mg | ORAL_TABLET | Freq: Every day | ORAL | Status: DC

## 2019-02-21 NOTE — ED Notes (Addendum)
Updated  Poison  Control  About patient's  lab work and and vital signs. They are signing off at this time but can be reached at ([phone]) for any further questions.

## 2019-02-21 NOTE — ED Notes (Signed)
Per BH he can go to [facility] at 9pm

## 2019-02-21 NOTE — ED Notes (Signed)
Bed: WA29 Expected date:  Expected time:  Means of arrival:  Comments: Hold room 24

## 2019-02-21 NOTE — Tx Team (Addendum)
Initial Treatment Plan 02/21/2019 11:28 PM Riley [name] POE:[id number]    PATIENT STRESSORS: Financial difficulties Medication change or noncompliance Traumatic event   PATIENT STRENGTHS: Ability for insight Motivation for treatment/growth Work skills   PATIENT IDENTIFIED PROBLEMS: Depression  ETOH abuse      " I don't have a goal yet."             DISCHARGE CRITERIA:  Improved stabilization in mood, thinking, and/or behavior  PRELIMINARY DISCHARGE PLAN: Return to previous living arrangement  PATIENT/FAMILY INVOLVEMENT: This treatment plan has been presented to and reviewed with the patient, Riley [name], and/or family member.  The patient and family have been given the opportunity to ask questions and make suggestions.  Floyce Stakes, RN 02/21/2019, 11:28 PM

## 2019-02-21 NOTE — ED Notes (Signed)
Pt requested and was given a gingerale to drink

## 2019-02-21 NOTE — BH Assessment (Signed)
[facility] Assessment Progress Note Per Akintayo MD patient meets inpatient criteria and will be IVCed by EDP at [facility].

## 2019-02-21 NOTE — ED Notes (Addendum)
TTS/tele psych at bedside 

## 2019-02-21 NOTE — ED Notes (Signed)
Patient Belongings (2 Severs patient belonging bags 1 duffle bag and 1 back pack )  placed in locker 29. Pt ankle bracelet charging at desk

## 2019-02-21 NOTE — Discharge Instructions (Signed)
Report given to [facility] who will resume care.

## 2019-02-21 NOTE — ED Notes (Signed)
Bed: HN88 Expected date:  Expected time:  Means of arrival:  Comments: 8

## 2019-02-21 NOTE — ED Provider Notes (Signed)
Care assumed from Dr. Criss Alvine, patient with altered mental status following overdose of Seroquel and alcohol.  He has been observed in the ED and is now awake and able to converse.  Will request consultation with TTS.  On labs, it is noted that he is hypokalemic and is given a dose of oral potassium.  TTS consultation is appreciated.  Patient meets criteria for inpatient care.  Awaiting appropriate placement.   Dione Booze, MD 02/21/19 [phone]

## 2019-02-21 NOTE — ED Notes (Signed)
Spoke with TTS who stated that pt meets in patient criteria. TTS is seeking in patient placement.

## 2019-02-21 NOTE — Progress Notes (Signed)
Accepted to [facility] [phone] at 9 pm per Berneice Heinrich, [facility].  Nanine Means, PMHNP
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
## 2019-02-21 NOTE — ED Notes (Signed)
Pt up moving around room and going through personal belongings. Asked pt to return to bed.

## 2019-02-21 NOTE — ED Notes (Signed)
  Pt is cooperative at this time. He is pacing around his room, talking with himself. He interacts pleasantly with staff members and is easily  redirected. Havery Moros RN at [phone] to given report, the pt will be transported via [facility] due to IVC papers to room 303. I will continue to monitor.

## 2019-02-22 DIAGNOSIS — T50904A Poisoning by unspecified drugs, medicaments and biological substances, undetermined, initial encounter: Secondary | ICD-10-CM

## 2019-02-22 DIAGNOSIS — F1024 Alcohol dependence with alcohol-induced mood disorder: Secondary | ICD-10-CM

## 2019-02-22 MED ORDER — TRAZODONE HCL 50 MG PO TABS
50.0000 mg | ORAL_TABLET | Freq: Every evening | ORAL | Status: DC | PRN
  Administered 2019-02-22: 22:00:00 50 mg via ORAL
  Filled 2019-02-22: qty 1

## 2019-02-22 MED ORDER — POTASSIUM CHLORIDE CRYS ER 20 MEQ PO TBCR
20.0000 meq | EXTENDED_RELEASE_TABLET | Freq: Two times a day (BID) | ORAL | Status: AC
  Administered 2019-02-22 – 2019-02-23 (×3): 20 meq via ORAL
  Filled 2019-02-22 (×4): qty 1

## 2019-02-22 MED ORDER — GABAPENTIN 100 MG PO CAPS
100.0000 mg | ORAL_CAPSULE | Freq: Three times a day (TID) | ORAL | Status: DC
  Administered 2019-02-22 – 2019-02-23 (×4): 100 mg via ORAL
  Filled 2019-02-22: qty 21
  Filled 2019-02-22: qty 1
  Filled 2019-02-22: qty 21
  Filled 2019-02-22 (×3): qty 1
  Filled 2019-02-22: qty 21
  Filled 2019-02-22: qty 1
  Filled 2019-02-22: qty 21
  Filled 2019-02-22: qty 1
  Filled 2019-02-22: qty 21
  Filled 2019-02-22: qty 1

## 2019-02-22 MED ORDER — TRAZODONE HCL 50 MG PO TABS
50.0000 mg | ORAL_TABLET | Freq: Every evening | ORAL | Status: DC | PRN
  Filled 2019-02-22: qty 14
  Filled 2019-02-22 (×2): qty 1
  Filled 2019-02-22 (×2): qty 14
  Filled 2019-02-22: qty 1
  Filled 2019-02-22: qty 14

## 2019-02-22 MED ORDER — NICOTINE POLACRILEX 2 MG MT GUM
2.0000 mg | CHEWING_GUM | OROMUCOSAL | Status: DC | PRN
  Administered 2019-02-23: 12:00:00 2 mg via ORAL
  Filled 2019-02-22: qty 1

## 2019-02-22 NOTE — Plan of Care (Signed)
  Problem: Education: Goal: Utilization of techniques to improve thought processes will improve Outcome: Progressing Goal: Knowledge of the prescribed therapeutic regimen will improve Outcome: Progressing   

## 2019-02-22 NOTE — [facility] Group Notes (Signed)
[facility] Group Notes: (Clinical Social Work)   02/22/2019      Type of Therapy:  Group Therapy   Participation Level:  Did Not Attend - was invited both individually by MHT and by overhead announcement   Kailin Leu Grossman-Orr, LCSW 02/22/2019, 10:50 AM      

## 2019-02-22 NOTE — Progress Notes (Signed)
Patient is a 28 yr old male admitted involuntarily. He was found in front of [place] unresponsive with empty Seroquel packets. (25, 100 mg packets) Report given stated that he drank vodka with the pills. He reports that he can drink 1/2 gallon of liquor when he has it. UDS + for THC.  Patient currently denies SI. He was cooperative during the admission process but was very fidgety and anxious. Meal provided, skin assessment completed and oriented to unit. Safety maintained on unit with 15 min checks.

## 2019-02-22 NOTE — Progress Notes (Signed)
D  Pt is seen by this writer- OOB UAL on the 300 hall. He tolerates this well. HE is seen in the dayroom, playing cards, laughing and joking with  His peers. HE says " this place isn't so bad after all". He wears his own clean clothes. He bathes today. HE is approrpaite in his manner .     A HE completed his dialy assessment and on this he wrote he denied SI and he rated his depession, hopelessness and anxiety " 3/0/3", respectively. He reports relief from his withdrawal symptoms with po librium ( agitation, frequent anxiety, and tremulousness.     R Safety in place.

## 2019-02-22 NOTE — [facility] Suicide Risk Assessment (Signed)
[facility] Admission Suicide Risk Assessment   Nursing information obtained from:  Patient Demographic factors:  Male, Caucasian, Adolescent or young adult, Low socioeconomic status Current Mental Status:  NA Loss Factors:  Decrease in vocational status, Financial problems / change in socioeconomic status Historical Factors:  NA Risk Reduction Factors:  NA  Total Time spent with patient: 45 minutes Principal Problem:  Alcohol Use Disorder, S/P Overdose , undetermined intent  Diagnosis:  Active Problems:   Major depressive disorder, recurrent severe without psychotic features (HCC)  Subjective Data:   Continued Clinical Symptoms:  Alcohol Use Disorder Identification Test Final Score (AUDIT): 27 The "Alcohol Use Disorders Identification Test", Guidelines for Use in Primary Care, Second Edition.  World Science writer [facility]). Score between 0-7:  no or low risk or alcohol related problems. Score between 8-15:  moderate risk of alcohol related problems. Score between 16-19:  high risk of alcohol related problems. Score 20 or above:  warrants further diagnostic evaluation for alcohol dependence and treatment.   CLINICAL FACTORS:  28 year old male, found unresponsive with Seroquel at side. Overdose suspected and brought to ED via EMS. Patient reports he had taken Seroquel to try to sleep and denies suicidal intent . Describes past history of depression and alcohol use disorder and is known to our unit for prior admissions for depression and SI, but states he has recently not been depressed and currently does not endorse neuro-vegetative symptoms. States he has been off his meds for several weeks. Drinking daily prior to admission. Admission BAL 341   Psychiatric Specialty Exam: Physical Exam  ROS  Blood pressure 118/86, pulse 83, temperature 98.3 F (36.8 C), temperature source Oral, resp. rate 18, height 5\' 7"  (1.702 m), weight 67.1 kg.Body mass index is 23.18 kg/m.   see admit note  MSE    COGNITIVE FEATURES THAT CONTRIBUTE TO RISK:  Closed-mindedness and Loss of executive function    SUICIDE RISK:   Moderate:  Frequent suicidal ideation with limited intensity, and duration, some specificity in terms of plans, no associated intent, good self-control, limited dysphoria/symptomatology, some risk factors present, and identifiable protective factors, including available and accessible social support.  PLAN OF CARE: Patient will be admitted to inpatient psychiatric unit for stabilization and safety. Will provide and encourage milieu participation. Provide medication management and maked adjustments as needed. Will also provide medication to address potential alcohol WDL-   Will follow daily.    I certify that inpatient services furnished can reasonably be expected to improve the patient's condition.   Craige Cotta, MD 02/22/2019, 11:39 AM

## 2019-02-22 NOTE — [facility] Group Notes (Signed)
Pt was asleep during morning check-in/goals group. 
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
## 2019-02-23 DIAGNOSIS — F332 Major depressive disorder, recurrent severe without psychotic features: Principal | ICD-10-CM

## 2019-02-23 LAB — LIPID PANEL
Cholesterol: 187 mg/dL (ref 0–200)
HDL: 53 mg/dL
LDL Cholesterol: 95 mg/dL (ref 0–99)
Total CHOL/HDL Ratio: 3.5 ratio
Triglycerides: 196 mg/dL — ABNORMAL HIGH
VLDL: 39 mg/dL (ref 0–40)

## 2019-02-23 LAB — BASIC METABOLIC PANEL WITH GFR
Anion gap: 7 (ref 5–15)
BUN: 17 mg/dL (ref 6–20)
CO2: 26 mmol/L (ref 22–32)
Calcium: 9 mg/dL (ref 8.9–10.3)
Chloride: 106 mmol/L (ref 98–111)
Creatinine, Ser: 0.86 mg/dL (ref 0.61–1.24)
GFR calc Af Amer: >60 mL/min
GFR calc non Af Amer: >60 mL/min
Glucose, Bld: 92 mg/dL (ref 70–99)
Potassium: 4.1 mmol/L (ref 3.5–5.1)
Sodium: 139 mmol/L (ref 135–145)

## 2019-02-23 LAB — HEMOGLOBIN A1C
Hgb A1c MFr Bld: 5 % (ref 4.8–5.6)
Mean Plasma Glucose: 96.8 mg/dL

## 2019-02-23 LAB — TSH: TSH: 1.65 u[IU]/mL (ref 0.350–4.500)

## 2019-02-23 MED ORDER — TRAZODONE HCL 50 MG PO TABS: 50.0000 mg | ORAL_TABLET | Freq: Every evening | ORAL | 0 refills | Status: AC | PRN

## 2019-02-23 MED ORDER — VORTIOXETINE HBR 10 MG PO TABS: 10.0000 mg | ORAL_TABLET | Freq: Every day | ORAL | 0 refills | Status: AC

## 2019-02-23 MED ORDER — GABAPENTIN 100 MG PO CAPS: 100.0000 mg | ORAL_CAPSULE | Freq: Three times a day (TID) | ORAL | 0 refills | Status: AC

## 2019-02-23 NOTE — Progress Notes (Signed)
Discharge note: Patient reviewed discharge paperwork with RN including prescriptions, follow up appointments, and lab work. Patient given the opportunity to ask questions. All concerns were addressed. All belongings were returned to patient. Denied SI/HI/AVH. Patient thanked staff for their care while at the hospital.  Patient was discharged to the lobby. 

## 2019-02-23 NOTE — B[facility] Suicide Risk Assessment (Signed)
[facility] Discharge Suicide Risk Assessment   Principal Problem: Chronic substance abuse and malingering Discharge Diagnoses: Active Problems:   Major depressive disorder, recurrent severe without psychotic features (HCC)   Total Time spent with patient: 45 minutes  During his admission in February he was malingering suicidality in order to stay hospitalized but he now is insistent upon leaving so he does not lose his job he denies suicidal thoughts plans or intent can contract fully understands what that means no cravings tremors or withdrawal reported or discerned no psychosis He is at baseline a difficult personality to deal with but he is not reporting suicidal thoughts plans or intent he is irritable at baseline  Mental Status Per Nursing Assessment::   On Admission:  NA  Demographic Factors:  Male and Caucasian  Loss Factors: Decrease in vocational status  Historical Factors: Impulsivity  Risk Reduction Factors:   Religious beliefs about death  Continued Clinical Symptoms:  Alcohol/Substance Abuse/Dependencies  Cognitive Features That Contribute To Risk:  Polarized thinking    Suicide Risk:  Minimal: No identifiable suicidal ideation.  Patients presenting with no risk factors but with morbid ruminations; may be classified as minimal risk based on the severity of the depressive symptoms  Follow-up Information    [facility] Follow up.   Why:  Needs an appointment for intake. Contact information: [address] [place] [address] [phone]           Plan Of Care/Follow-up recommendations:  Activity:  full  Maeby Vankleeck, MD 02/23/2019, 9:34 AM

## 2019-02-23 NOTE — Tx Team (Signed)
Interdisciplinary Treatment and Diagnostic Plan Update  02/23/2019 Time of Session: 0901 Riley SermonRobert G [name] MRN: [id number]  Principal Diagnosis: <principal problem not specified>  Secondary Diagnoses: Active Problems:   Major depressive disorder, recurrent severe without psychotic features (HCC)   Current Medications:  Current Facility-Administered Medications  Medication Dose Route Frequency Provider Last Rate Last Dose  . acetaminophen (TYLENOL) tablet 650 mg  650 mg Oral Q4H PRN Charm RingsLord, Jamison Y, NP      . alum & mag hydroxide-simeth (MAALOX/MYLANTA) 200-200-20 MG/5ML suspension 30 mL  30 mL Oral Q6H PRN Charm RingsLord, Jamison Y, NP      . chlordiazePOXIDE (LIBRIUM) capsule 25 mg  25 mg Oral Q6H PRN Charm RingsLord, Jamison Y, NP   25 mg at 02/22/19 2345  . chlordiazePOXIDE (LIBRIUM) capsule 25 mg  25 mg Oral TID Charm RingsLord, Jamison Y, NP   25 mg at 02/23/19 0805   Followed by  . chlordiazePOXIDE (LIBRIUM) capsule 25 mg  25 mg Oral BH-qamhs Lord, Herminio HeadsJamison Y, NP       Followed by  . [START ON 02/25/2019] chlordiazePOXIDE (LIBRIUM) capsule 25 mg  25 mg Oral Daily Charm RingsLord, Jamison Y, NP      . gabapentin (NEURONTIN) capsule 100 mg  100 mg Oral TID Cobos, Rockey SituFernando A, MD   100 mg at 02/23/19 0805  . loperamide (IMODIUM) capsule 2-4 mg  2-4 mg Oral PRN Charm RingsLord, Jamison Y, NP      . magnesium hydroxide (MILK OF MAGNESIA) suspension 30 mL  30 mL Oral Daily PRN Charm RingsLord, Jamison Y, NP      . multivitamin with minerals tablet 1 tablet  1 tablet Oral Daily Charm RingsLord, Jamison Y, NP   1 tablet at 02/23/19 0805  . nicotine (NICODERM CQ - dosed in mg/24 hours) patch 21 mg  21 mg Transdermal Daily Nanine MeansLord, Jamison Y, NP      . nicotine polacrilex (NICORETTE) gum 2 mg  2 mg Oral PRN Cobos, Rockey SituFernando A, MD      . ondansetron (ZOFRAN-ODT) disintegrating tablet 4 mg  4 mg Oral Q6H PRN Charm RingsLord, Jamison Y, NP      . thiamine (VITAMIN B-1) tablet 100 mg  100 mg Oral Daily Charm RingsLord, Jamison Y, NP   100 mg at 02/23/19 0805  . traZODone (DESYREL) tablet 50 mg  50 mg  Oral QHS,MR X 1 Berry, Jason A, NP      . vortioxetine HBr (TRINTELLIX) tablet 10 mg  10 mg Oral Daily Charm RingsLord, Jamison Y, NP   10 mg at 02/22/19 1342   PTA Medications: Medications Prior to Admission  Medication Sig Dispense Refill Last Dose  . gabapentin (NEURONTIN) 400 MG capsule Take 1 capsule (400 mg total) by mouth 3 (three) times daily. 90 capsule 1 02/20/2019 at Unknown time  . hydrOXYzine (ATARAX/VISTARIL) 50 MG tablet Take 50 mg by mouth 3 (three) times daily as needed for anxiety.     Marland Kitchen. ibuprofen (ADVIL) 400 MG tablet Take 400 mg by mouth every 6 (six) hours as needed for headache or mild pain.     Marland Kitchen. QUEtiapine (SEROQUEL) 100 MG tablet Take 1 tablet (100 mg total) by mouth 3 (three) times daily. 90 tablet 1 12/15/2018 at Unknown time  . traZODone (DESYREL) 100 MG tablet Take 1 tablet (100 mg total) by mouth at bedtime as needed for sleep. 30 tablet 1 12/15/2018 at Unknown time  . [DISCONTINUED] vortioxetine HBr (TRINTELLIX) 10 MG TABS tablet Take 1 tablet (10 mg total) by mouth daily. 30 tablet 2  Patient Stressors: Financial difficulties Medication change or noncompliance Traumatic event  Patient Strengths: Ability for insight Motivation for treatment/growth Work skills  Treatment Modalities: Medication Management, Group therapy, Case management,  1 to 1 session with clinician, Psychoeducation, Recreational therapy.   Physician Treatment Plan for Primary Diagnosis: <principal problem not specified> Long Term Goal(s): Improvement in symptoms so as ready for discharge Improvement in symptoms so as ready for discharge   Short Term Goals: Ability to identify changes in lifestyle to reduce recurrence of condition will improve Ability to verbalize feelings will improve Ability to disclose and discuss suicidal ideas Ability to demonstrate self-control will improve Ability to identify and develop effective coping behaviors will improve Ability to maintain clinical measurements  within normal limits will improve Ability to identify changes in lifestyle to reduce recurrence of condition will improve Ability to identify triggers associated with substance abuse/mental health issues will improve  Medication Management: Evaluate patient's response, side effects, and tolerance of medication regimen.  Therapeutic Interventions: 1 to 1 sessions, Unit Group sessions and Medication administration.  Evaluation of Outcomes: Progressing  Physician Treatment Plan for Secondary Diagnosis: Active Problems:   Major depressive disorder, recurrent severe without psychotic features (HCC)  Long Term Goal(s): Improvement in symptoms so as ready for discharge Improvement in symptoms so as ready for discharge   Short Term Goals: Ability to identify changes in lifestyle to reduce recurrence of condition will improve Ability to verbalize feelings will improve Ability to disclose and discuss suicidal ideas Ability to demonstrate self-control will improve Ability to identify and develop effective coping behaviors will improve Ability to maintain clinical measurements within normal limits will improve Ability to identify changes in lifestyle to reduce recurrence of condition will improve Ability to identify triggers associated with substance abuse/mental health issues will improve     Medication Management: Evaluate patient's response, side effects, and tolerance of medication regimen.  Therapeutic Interventions: 1 to 1 sessions, Unit Group sessions and Medication administration.  Evaluation of Outcomes: Progressing   RN Treatment Plan for Primary Diagnosis: <principal problem not specified> Long Term Goal(s): Knowledge of disease and therapeutic regimen to maintain health will improve  Short Term Goals: Ability to participate in decision making will improve, Ability to verbalize feelings will improve, Ability to disclose and discuss suicidal ideas, Ability to identify and develop  effective coping behaviors will improve and Compliance with prescribed medications will improve  Medication Management: RN will administer medications as ordered by provider, will assess and evaluate patient's response and provide education to patient for prescribed medication. RN will report any adverse and/or side effects to prescribing provider.  Therapeutic Interventions: 1 on 1 counseling sessions, Psychoeducation, Medication administration, Evaluate responses to treatment, Monitor vital signs and CBGs as ordered, Perform/monitor CIWA, COWS, AIMS and Fall Risk screenings as ordered, Perform wound care treatments as ordered.  Evaluation of Outcomes: Progressing   LCSW Treatment Plan for Primary Diagnosis: <principal problem not specified> Long Term Goal(s): Safe transition to appropriate next level of care at discharge, Engage patient in therapeutic group addressing interpersonal concerns.  Short Term Goals: Engage patient in aftercare planning with referrals and resources and Increase skills for wellness and recovery  Therapeutic Interventions: Assess for all discharge needs, 1 to 1 time with Social worker, Explore available resources and support systems, Assess for adequacy in community support network, Educate family and significant other(s) on suicide prevention, Complete Psychosocial Assessment, Interpersonal group therapy.  Evaluation of Outcomes: Progressing   Progress in Treatment: Attending groups: No. Participating in groups:  No. Taking medication as prescribed: Yes. Toleration medication: Yes. Family/Significant other contact made: No, will contact:  pt's sister Patient understands diagnosis: Yes. Discussing patient identified problems/goals with staff: Yes. Medical problems stabilized or resolved: Yes. Denies suicidal/homicidal ideation: Yes. Issues/concerns per patient self-inventory: No. Other:   New problem(s) identified: No, Describe:  None  New Short Term/Long  Term Goal(s):  Patient Goals:  "Work on myself"  Discharge Plan or Barriers:   Reason for Continuation of Hospitalization: Anxiety Medication stabilization  Estimated Length of Stay: 2-3 days  Attendees: Patient: 02/23/2019  Physician: Dr. Jeannine Kitten, MD 02/23/2019   Nursing: Arlyss Repress, RN 02/23/2019  RN Care Manager: 02/23/2019  Social Worker: Stephannie Peters, LCSW 02/23/2019   Recreational Therapist:  02/23/2019   Other:  02/23/2019   Other:  02/23/2019  Other: 02/23/2019      Scribe for Treatment Team: Delphia Grates, LCSW 02/23/2019 9:22 AM

## 2019-02-23 NOTE — Progress Notes (Signed)
Pt attended spiritual care group on grief and loss facilitated by chaplain Haniyyah Sakuma   Group opened with brief discussion and psycho-social ed around grief and loss in relationships and in relation to self.  Group members engaged in facilitated dialog and support  - identifying life patterns, circumstances, changes that cause losses.  Group goal of establishing open and affirming space for members to share loss and experience with grief, normalize grief experience and provide psycho social education and grief support. 

## 2019-02-23 NOTE — Progress Notes (Signed)
Closing encounter per request. 

## 2019-02-23 NOTE — Plan of Care (Signed)
  Problem: Education: Goal: Knowledge of the prescribed therapeutic regimen will improve Outcome: Adequate for Discharge   Problem: Activity: Goal: Interest or engagement in leisure activities will improve Outcome: Adequate for Discharge   Problem: Activity: Goal: Imbalance in normal sleep/wake cycle will improve Outcome: Adequate for Discharge   Problem: Coping: Goal: Coping ability will improve Outcome: Adequate for Discharge   

## 2019-02-23 NOTE — Progress Notes (Addendum)
  [facility] Adult Case Management Discharge Plan :  Will you be returning to the same living situation after discharge:  Yes,  returning to his tent At discharge, do you have transportation home?: Yes,  buses are free Do you have the ability to pay for your medications: No. Referred to [facility].  Release of information consent forms completed and in the chart. Patient to Follow up at:  [facility] Follow up on 02/26/2019    Instructions: Hospital follow up appointment is Thursday, 5/7 at 10:00a.  The appointment will be over the phone and the therpist will contact you.          Next level of care provider has access to [facility] Link:no  Safety Planning and Suicide Prevention discussed: Yes,  with sister  Have you used any form of tobacco in the last 30 days? (Cigarettes, Smokeless Tobacco, Cigars, and/or Pipes): Yes  Has patient been referred to the Quitline?: Patient refused referral  Patient has been referred for addiction treatment: Pt. refused referral  Darreld Mclean, LCSWA 02/23/2019, 11:17 AM

## 2019-02-23 NOTE — Progress Notes (Signed)
Patient has been up and active on the unit. He has been interacting well with his room mate. Patient currently denies having pain, -si/hi/a/v hall. Support and encouragement offered, safety maintained on unit, will continue to monitor.

## 2019-02-23 NOTE — Discharge Summary (Signed)
Physician Discharge Summary Note  Patient:  Riley [name] is an 28 y.o., male MRN:  [id number] DOB:  March 27, 1991 Patient phone:  [phone] (home)  Patient address:   [place] [address],  Total Time spent with patient: 20 minutes  Date of Admission:  02/21/2019 Date of Discharge: 02/23/19  Reason for Admission:  Suspected seroquel overdose  Principal Problem: Major depressive disorder, recurrent severe without psychotic features [facility]) Discharge Diagnoses: Principal Problem:   Major depressive disorder, recurrent severe without psychotic features [facility])   Past Psychiatric History: history of past psychiatric admissions, most recently here at [facility] on 11/2018, for depression and suicide attempt.  Reports history of two prior suicide attempts. States he has been diagnosed with depression and " I have also been told I have Bipolar Disorder ", but currently does not endorse any clear history of mania [place].  Denies history of psychosis. Denies history of violence   Past Medical History:  Past Medical History:  Diagnosis Date  . Alcoholism (HCC)    History reviewed. No pertinent surgical history. Family History:  Family History  Problem Relation Age of Onset  . Hypertension Other   . Diabetes Other    Family Psychiatric  History: states mother and father had " Bipolar " and had history of alcohol abuse . Maternal uncle committed suicide.  Social History:  Social History   Substance and Sexual Activity  Alcohol Use Yes   Comment: Pt stated "I drink  1/2 gal of 100 proof every day"     Social History   Substance and Sexual Activity  Drug Use Yes  . Frequency: 2.0 times per week  . Types: Marijuana    Social History   Socioeconomic History  . Marital status: Single    Spouse name: Not on file  . Number of children: 0  . Years of education: Not on file  . Highest education level: GED or equivalent  Occupational History  . Not on file  Social Needs  .  Financial resource strain: Very hard  . Food insecurity:    Worry: Often true    Inability: Often true  . Transportation needs:    Medical: Yes    Non-medical: Yes  Tobacco Use  . Smoking status: Current Every Day Smoker    Packs/day: 1.50    Types: Cigarettes  . Smokeless tobacco: Never Used  Substance and Sexual Activity  . Alcohol use: Yes    Comment: Pt stated "I drink  1/2 gal of 100 proof every day"  . Drug use: Yes    Frequency: 2.0 times per week    Types: Marijuana  . Sexual activity: Yes    Birth control/protection: None  Lifestyle  . Physical activity:    Days per week: Patient refused    Minutes per session: Patient refused  . Stress: Very much  Relationships  . Social connections:    Talks on phone: Patient refused    Gets together: Patient refused    Attends religious service: Patient refused    Active member of club or organization: Patient refused    Attends meetings of clubs or organizations: Patient refused    Relationship status: Patient refused  Other Topics Concern  . Not on file  Social History Narrative  . Not on file    Hospital Course:   MD Assessment 02/22/19: 28 year old male, brought in to ED on 5/1 via EMS after being found unresponsive and apneic on a sidewalk.Seroquel overdose was suspected as this  medication was found at his side . Admission BAL 341, admission UDS positive for Cannabis. Patient denies any suicidal plan or intention, states " I was trying to get some sleep so took some Seroquel, which I don't take every day, and went out for a walk". States he started feeling drowsy during walk, and decided to rest . " Next thing I know the paramedics are there".  He does endorse daily alcohol consumption , but states " I have been slowing down". States currently drinking 2-3 24 ounce beers per day. Smokes cannabis several times a week and denies other drug use . States " I have actually been doing pretty good recently, been working, doing  OK", and minimizes depression or worsening mood leading up to this admission. Currently does not endorse significant neuro-vegetative symptoms, and denies any psychotic symptoms * Patient is known to [facility] from prior admissions, most recently Feb/2020, at which time presented for suicidal attempt by overdose and alcohol dependence. At th time was discharged on Seroquel, Neurontin, Trintellix . States he ran out of medications about 2 months ago , but has continued to take Seroquel " here and there", for insomnia.   Patient remained on the [facility] unit for 1 days. The patient stabilized on medication and therapy. Patient was discharged on Trazodone 50 mg QHS PRN, Gabapentin 100 mg TID, and Trintellix 10 mg Daily. Patient has shown improvement with improved mood, affect, sleep, appetite, and interaction. Patient has attended group and participated. Patient has been seen in the day room interacting with peers and staff appropriately. Patient denies any SI/HI/AVH and contracts for safety. Patient agrees to follow up at [facility]. Patient is provided with prescriptions for their medications upon discharge.   Physical Findings: AIMS: Facial and Oral Movements Muscles of Facial Expression: None, normal Lips and Perioral Area: None, normal Jaw: None, normal Tongue: None, normal,Extremity Movements Upper (arms, wrists, hands, fingers): None, normal Lower (legs, knees, ankles, toes): None, normal, Trunk Movements Neck, shoulders, hips: None, normal, Overall Severity Severity of abnormal movements (highest score from questions above): None, normal Incapacitation due to abnormal movements: None, normal Patient's awareness of abnormal movements (rate only patient's report): No Awareness, Dental Status Current problems with teeth and/or dentures?: No Does patient usually wear dentures?: No  CIWA:  CIWA-Ar Total: 5 COWS:     Musculoskeletal: Strength & Muscle Tone: within normal limits Gait & Station:  normal Patient leans: N/A  Psychiatric Specialty Exam: Physical Exam  Nursing note and vitals reviewed. Constitutional: He is oriented to person, place, and time. He appears well-developed and well-nourished.  Cardiovascular: Normal rate.  Respiratory: Effort normal.  Musculoskeletal: Normal range of motion.  Neurological: He is alert and oriented to person, place, and time.  Skin: Skin is warm.    Review of Systems  Constitutional: Negative.   HENT: Negative.   Eyes: Negative.   Respiratory: Negative.   Cardiovascular: Negative.   Gastrointestinal: Negative.   Genitourinary: Negative.   Musculoskeletal: Negative.   Skin: Negative.   Neurological: Negative.   Endo/Heme/Allergies: Negative.   Psychiatric/Behavioral: Negative.     Blood pressure (!) 122/95, pulse 72, temperature 98.3 F (36.8 C), temperature source Oral, resp. rate 18, height 5\' 7"  (1.702 m), weight 67.1 kg.Body mass index is 23.18 kg/m.  General Appearance: Casual  Eye Contact:  Good  Speech:  Clear and Coherent and Normal Rate  Volume:  Normal  Mood:  Euthymic  Affect:  Congruent  Thought Process:  Coherent and Descriptions of Associations: Intact  Orientation:  Full (Time, Place, and Person)  Thought Content:  WDL  Suicidal Thoughts:  No  Homicidal Thoughts:  No  Memory:  Immediate;   Good Recent;   Good Remote;   Good  Judgement:  Fair  Insight:  Good  Psychomotor Activity:  Normal  Concentration:  Concentration: Good and Attention Span: Good  Recall:  Good  Fund of Knowledge:  Good  Language:  Good  Akathisia:  No  Handed:  Right  AIMS (if indicated):     Assets:  Communication Skills Desire for Improvement Financial Resources/Insurance Physical Health Social Support Transportation  ADL's:  Intact  Cognition:  WNL  Sleep:  Number of Hours: 5.5     Have you used any form of tobacco in the last 30 days? (Cigarettes, Smokeless Tobacco, Cigars, and/or Pipes): Yes  Has this patient  used any form of tobacco in the last 30 days? (Cigarettes, Smokeless Tobacco, Cigars, and/or Pipes) Yes, Yes, A prescription for an FDA-approved tobacco cessation medication was offered at discharge and the patient refused  Blood Alcohol level:  Lab Results  Component Value Date   ETH 341 (HH) 02/20/2019   ETH <10 12/16/2018    Metabolic Disorder Labs:  Lab Results  Component Value Date   HGBA1C 5.0 02/23/2019   MPG 96.8 02/23/2019   MPG 102.54 12/03/2018   No results found for: PROLACTIN Lab Results  Component Value Date   CHOL 187 02/23/2019   TRIG 196 (H) 02/23/2019   HDL 53 02/23/2019   CHOLHDL 3.5 02/23/2019   VLDL 39 02/23/2019   LDLCALC 95 02/23/2019    See Psychiatric Specialty Exam and Suicide Risk Assessment completed by Attending Physician prior to discharge.  Discharge destination:  Home  Is patient on multiple antipsychotic therapies at discharge:  No   Has Patient had three or more failed trials of antipsychotic monotherapy by history:  No  Recommended Plan for Multiple Antipsychotic Therapies: NA   Allergies as of 02/23/2019   No Known Allergies     Medication List    STOP taking these medications   hydrOXYzine 50 MG tablet Commonly known as:  ATARAX/VISTARIL   ibuprofen 400 MG tablet Commonly known as:  ADVIL   QUEtiapine 100 MG tablet Commonly known as:  SEROQUEL     TAKE these medications     Indication  gabapentin 100 MG capsule Commonly known as:  NEURONTIN Take 1 capsule (100 mg total) by mouth 3 (three) times daily. What changed:    medication strength  how much to take  Indication:  Abuse or Misuse of Alcohol, Neuropathic Pain   traZODone 50 MG tablet Commonly known as:  DESYREL Take 1 tablet (50 mg total) by mouth at bedtime and may repeat dose one time if needed. What changed:    medication strength  how much to take  when to take this  reasons to take this  Indication:  Trouble Sleeping   vortioxetine HBr 10 MG  Tabs tablet Commonly known as:  TRINTELLIX Take 1 tablet (10 mg total) by mouth daily.  Indication:  Major Depressive Disorder      Follow-up Information    [facility] Follow up.   Why:  Needs an appointment for intake. Contact information: [address] [place] [id number] [phone]           Follow-up recommendations:  Continue activity as tolerated. Continue diet as recommended by your PCP. Ensure to keep all appointments with outpatient providers.  Comments:  Patient is  instructed prior to discharge to: Take all medications as prescribed by his/her mental healthcare provider. Report any adverse effects and or reactions from the medicines to his/her outpatient provider promptly. Patient has been instructed & cautioned: To not engage in alcohol and or illegal drug use while on prescription medicines. In the event of worsening symptoms, patient is instructed to call the crisis hotline, 911 and or go to the nearest ED for appropriate evaluation and treatment of symptoms. To follow-up with his/her primary care provider for your other medical issues, concerns and or health care needs.    Signed: Gerlene Burdock Kayl Stogdill, FNP 02/23/2019, 11:11 AM

## 2019-02-23 NOTE — [facility] Suicide Risk Assessment (Signed)
[facility] INPATIENT:  Family/Significant Other Suicide Prevention Education  Suicide Prevention Education:  Education Completed; sister Leafy Ro [phone] has been identified by the patient as the family member/significant other with whom the patient will be residing, and identified as the person(s) who will aid the patient in the event of a mental health crisis (suicidal ideations/suicide attempt).  With written consent from the patient, the family member/significant other has been provided the following suicide prevention education, prior to the and/or following the discharge of the patient.  The suicide prevention education provided includes the following:  Suicide risk factors  Suicide prevention and interventions  National Suicide Hotline telephone number  [facility] assessment telephone number  [facility] Emergency Assistance 911  [facility] and/or Residential Mobile Crisis Unit telephone number  Request made of family/significant other to:  Remove weapons (e.g., guns, rifles, knives), all items previously/currently identified as safety concern.    Remove drugs/medications (over-the-counter, prescriptions, illicit drugs), all items previously/currently identified as a safety concern.  The family member/significant other verbalizes understanding of the suicide prevention education information provided.  The family member/significant other agrees to remove the items of safety concern listed above.   Sister was not aware that patient is hospitalized but she reports she is not surprised. She reports that patient has a history of presenting to the E.D. or [facility] for SI/ETOH abuse and then wants to discharge in a short amount of time to use drugs or alcohol again.  She is aware of SPE information, she says she speaks to her brother periodically, he usually calls after he leaves the hospital. No questions or new safety concerns, thanked CSW for the  notification.  Darreld Mclean 02/23/2019, 10:32 AM

## 2019-02-23 NOTE — Progress Notes (Signed)
Recreation Therapy Notes  Date:  5.4.20 Time: 0930 Location: 300 Hall Dayroom  Group Topic: Stress Management  Goal Area(s) Addresses:  Patient will identify positive stress management techniques. Patient will identify benefits of using stress management post d/c.  Intervention: Stress Management  Activity :  Meditation.  LRT introduced the stress management technique of meditation.  The meditation focused on being resilient in the face of adversity.  Patients were to listen as meditation played in order to engage in activity.  Education:  Stress Management, Discharge Planning.   Education Outcome: Acknowledges Education  Clinical Observations/Feedback: Pt did not attend group.     Albina Gosney, LRT/CTRS         Darla Mcdonald A 02/23/2019 10:47 AM 

## 2019-02-23 NOTE — Progress Notes (Addendum)
Riley [name]) requested to speak with chaplain following grief and loss group.  Reported that his cousin had died by suicide and expressed uncertainty about her afterlife.  Riley [name] noted that he believes that if he dies by suicide he will go to hell - noted his prior attempts at suicide convincing him that he is "here for something."   Chaplain provided affirming space as Riley [name] spoke about religious narrative.

## 2020-08-20 IMAGING — DX DG CHEST 1V PORT
1 series · 1 of 1 positions shown · non-contrast
Comparison: 06/11/2018

CLINICAL DATA: Tachypnea

EXAM:
PORTABLE CHEST 1 VIEW

[chest ap]
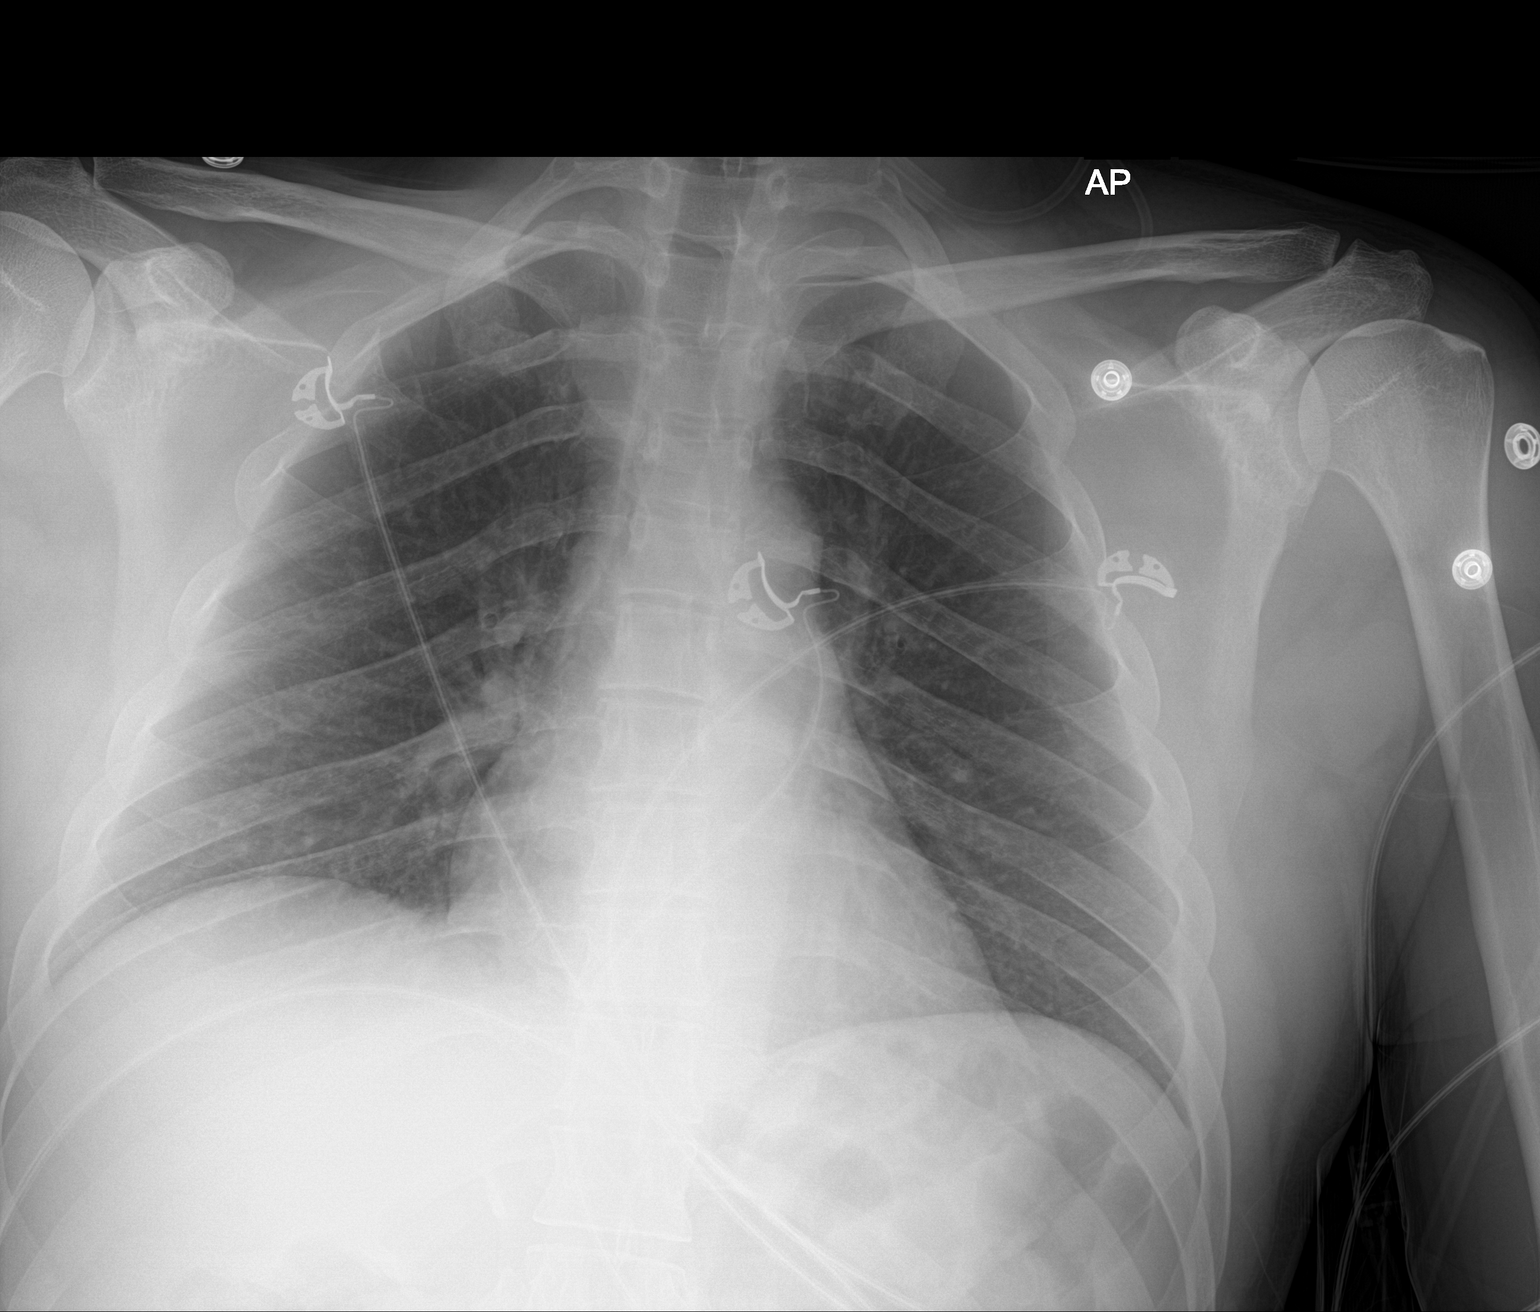

[1 of 1 positions shown; findings below may reference images not displayed]

FINDINGS: The cardiomediastinal silhouette is unremarkable.

There is no evidence of focal airspace disease, pulmonary edema,
suspicious pulmonary nodule/mass, pleural effusion, or pneumothorax.

No acute bony abnormalities are identified.
IMPRESSION: No active disease.

## 2020-08-22 IMAGING — DX DG CHEST 1V PORT
1 series · 1 of 1 positions shown · non-contrast
Comparison: December 03, 2018

CLINICAL DATA: Shortness of Breath

EXAM:
PORTABLE CHEST 1 VIEW

[chest ap]
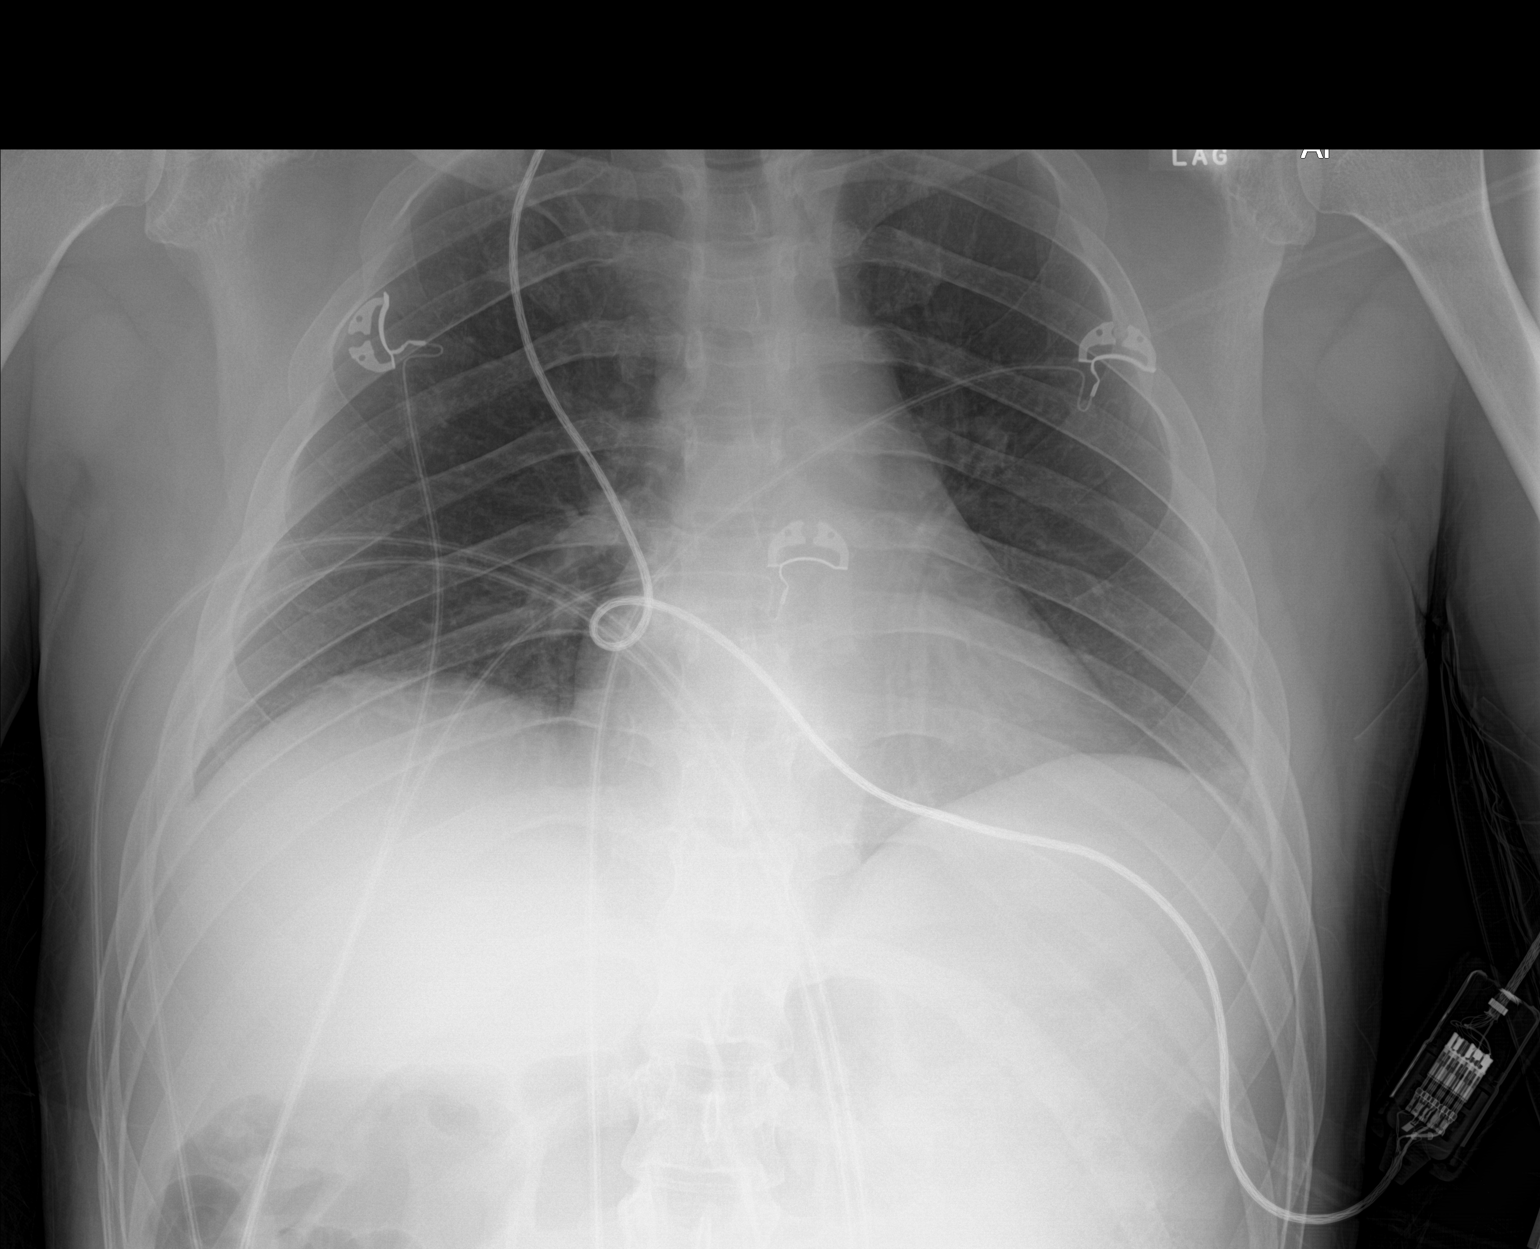

[1 of 1 positions shown; findings below may reference images not displayed]

FINDINGS: There is no appreciable edema or consolidation. The heart size and
pulmonary vascularity are normal. No adenopathy. No bone lesions.
IMPRESSION: No edema or consolidation.

## 2023-02-24 ENCOUNTER — Other Ambulatory Visit: Payer: Self-pay

## 2023-02-24 ENCOUNTER — Encounter (HOSPITAL_COMMUNITY): Payer: Self-pay

## 2023-02-24 ENCOUNTER — Emergency Department (HOSPITAL_COMMUNITY): Payer: Medicaid Other

## 2023-02-24 ENCOUNTER — Inpatient Hospital Stay (HOSPITAL_COMMUNITY)
Admission: EM | Admit: 2023-02-24 | Discharge: 2023-03-01 | DRG: 894 | Discharge status: Against Medical Advice | Payer: Medicaid Other | Attending: Internal Medicine | Admitting: Internal Medicine

## 2023-02-24 DIAGNOSIS — Z8249 Family history of ischemic heart disease and other diseases of the circulatory system: Secondary | ICD-10-CM

## 2023-02-24 DIAGNOSIS — R110 Nausea: Secondary | ICD-10-CM | POA: Diagnosis not present

## 2023-02-24 DIAGNOSIS — F10939 Alcohol use, unspecified with withdrawal, unspecified: Secondary | ICD-10-CM | POA: Diagnosis present

## 2023-02-24 DIAGNOSIS — F1020 Alcohol dependence, uncomplicated: Secondary | ICD-10-CM | POA: Diagnosis present

## 2023-02-24 DIAGNOSIS — F10930 Alcohol use, unspecified with withdrawal, uncomplicated: Principal | ICD-10-CM

## 2023-02-24 DIAGNOSIS — Y907 Blood alcohol level of 200-239 mg/100 ml: Secondary | ICD-10-CM | POA: Diagnosis present

## 2023-02-24 DIAGNOSIS — I161 Hypertensive emergency: Secondary | ICD-10-CM | POA: Diagnosis not present

## 2023-02-24 DIAGNOSIS — F419 Anxiety disorder, unspecified: Secondary | ICD-10-CM | POA: Diagnosis present

## 2023-02-24 DIAGNOSIS — E876 Hypokalemia: Secondary | ICD-10-CM | POA: Diagnosis present

## 2023-02-24 DIAGNOSIS — Z79899 Other long term (current) drug therapy: Secondary | ICD-10-CM

## 2023-02-24 DIAGNOSIS — F10230 Alcohol dependence with withdrawal, uncomplicated: Principal | ICD-10-CM | POA: Diagnosis present

## 2023-02-24 DIAGNOSIS — B379 Candidiasis, unspecified: Secondary | ICD-10-CM | POA: Diagnosis present

## 2023-02-24 DIAGNOSIS — Z5900 Homelessness unspecified: Secondary | ICD-10-CM

## 2023-02-24 DIAGNOSIS — Z833 Family history of diabetes mellitus: Secondary | ICD-10-CM

## 2023-02-24 DIAGNOSIS — F17210 Nicotine dependence, cigarettes, uncomplicated: Secondary | ICD-10-CM | POA: Diagnosis present

## 2023-02-24 DIAGNOSIS — F332 Major depressive disorder, recurrent severe without psychotic features: Secondary | ICD-10-CM | POA: Diagnosis present

## 2023-02-24 LAB — URINALYSIS, ROUTINE W REFLEX MICROSCOPIC
Bilirubin Urine: NEGATIVE
Glucose, UA: NEGATIVE mg/dL
Hgb urine dipstick: NEGATIVE
Ketones, ur: NEGATIVE mg/dL
Leukocytes,Ua: NEGATIVE
Nitrite: NEGATIVE
Protein, ur: NEGATIVE mg/dL
Specific Gravity, Urine: 1.015 (ref 1.005–1.030)
pH: 5 (ref 5.0–8.0)

## 2023-02-24 LAB — COMPREHENSIVE METABOLIC PANEL
ALT: 52 U/L — ABNORMAL HIGH (ref 0–44)
AST: 43 U/L — ABNORMAL HIGH (ref 15–41)
Albumin: 3.6 g/dL (ref 3.5–5.0)
Alkaline Phosphatase: 117 U/L (ref 38–126)
Anion gap: 12 (ref 5–15)
BUN: 7 mg/dL (ref 6–20)
CO2: 24 mmol/L (ref 22–32)
Calcium: 8.2 mg/dL — ABNORMAL LOW (ref 8.9–10.3)
Chloride: 105 mmol/L (ref 98–111)
Creatinine, Ser: 1.03 mg/dL (ref 0.61–1.24)
GFR, Estimated: >60 mL/min (ref >60)
Glucose, Bld: 112 mg/dL — ABNORMAL HIGH (ref 70–99)
Potassium: 3.3 mmol/L — ABNORMAL LOW (ref 3.5–5.1)
Sodium: 141 mmol/L (ref 135–145)
Total Bilirubin: 0.3 mg/dL (ref 0.3–1.2)
Total Protein: 7.3 g/dL (ref 6.5–8.1)

## 2023-02-24 LAB — CBC WITH DIFFERENTIAL/PLATELET
Abs Immature Granulocytes: 0.01 10*3/uL (ref 0.00–0.07)
Basophils Absolute: 0.1 10*3/uL (ref 0.0–0.1)
Basophils Relative: 1 %
Eosinophils Absolute: 0.3 10*3/uL (ref 0.0–0.5)
Eosinophils Relative: 5 %
HCT: 42.3 % (ref 39.0–52.0)
Hemoglobin: 14.6 g/dL (ref 13.0–17.0)
Immature Granulocytes: 0 %
Lymphocytes Relative: 33 %
Lymphs Abs: 2.1 10*3/uL (ref 0.7–4.0)
MCH: 33.3 pg (ref 26.0–34.0)
MCHC: 34.5 g/dL (ref 30.0–36.0)
MCV: 96.4 fL (ref 80.0–100.0)
Monocytes Absolute: 0.2 10*3/uL (ref 0.1–1.0)
Monocytes Relative: 4 %
Neutro Abs: 3.5 10*3/uL (ref 1.7–7.7)
Neutrophils Relative %: 57 %
Platelets: 294 10*3/uL (ref 150–400)
RBC: 4.39 MIL/uL (ref 4.22–5.81)
RDW: 12.5 % (ref 11.5–15.5)
WBC: 6.2 10*3/uL (ref 4.0–10.5)
nRBC: 0 % (ref 0.0–0.2)

## 2023-02-24 LAB — RAPID URINE DRUG SCREEN, HOSP PERFORMED
Amphetamines: NOT DETECTED
Barbiturates: NOT DETECTED
Benzodiazepines: NOT DETECTED
Cocaine: NOT DETECTED
Opiates: NOT DETECTED
Tetrahydrocannabinol: POSITIVE — AB

## 2023-02-24 LAB — CBG MONITORING, ED: Glucose-Capillary: 213 mg/dL — ABNORMAL HIGH (ref 70–99)

## 2023-02-24 LAB — ETHANOL: Alcohol, Ethyl (B): 227 mg/dL — ABNORMAL HIGH (ref <10)

## 2023-02-24 LAB — TROPONIN I (HIGH SENSITIVITY): Troponin I (High Sensitivity): 4 ng/L (ref <18)

## 2023-02-24 MED ORDER — LORAZEPAM 2 MG/ML IJ SOLN
2.0000 mg | Freq: Once | INTRAMUSCULAR | Status: DC
  Filled 2023-02-24 (×2): qty 1

## 2023-02-24 MED ORDER — LORAZEPAM 0.5 MG PO TABS
0.5000 mg | ORAL_TABLET | ORAL | Status: DC | PRN
  Administered 2023-02-25: 0.5 mg via ORAL
  Filled 2023-02-24: qty 1

## 2023-02-24 MED ORDER — LORAZEPAM 1 MG PO TABS
1.0000 mg | ORAL_TABLET | ORAL | Status: DC | PRN
  Administered 2023-02-24: 1 mg via ORAL
  Administered 2023-02-24: 2 mg via ORAL
  Administered 2023-02-26 – 2023-02-27 (×2): 1 mg via ORAL
  Administered 2023-02-27 (×2): 2 mg via ORAL
  Administered 2023-02-28: 3 mg via ORAL
  Administered 2023-02-28: 4 mg via ORAL
  Administered 2023-02-28: 3 mg via ORAL
  Administered 2023-02-28: 2 mg via ORAL
  Administered 2023-02-28 – 2023-03-01 (×4): 4 mg via ORAL
  Filled 2023-02-24: qty 2
  Filled 2023-02-24: qty 1
  Filled 2023-02-24: qty 2
  Filled 2023-02-24: qty 1
  Filled 2023-02-24: qty 3
  Filled 2023-02-24: qty 4
  Filled 2023-02-24 (×3): qty 2
  Filled 2023-02-24: qty 4
  Filled 2023-02-24: qty 1
  Filled 2023-02-24 (×2): qty 2
  Filled 2023-02-24: qty 4
  Filled 2023-02-24: qty 3
  Filled 2023-02-24 (×2): qty 4

## 2023-02-24 MED ORDER — FOLIC ACID 1 MG PO TABS
1.0000 mg | ORAL_TABLET | Freq: Every day | ORAL | Status: DC
  Administered 2023-02-24 – 2023-03-01 (×6): 1 mg via ORAL
  Filled 2023-02-24 (×6): qty 1

## 2023-02-24 MED ORDER — LACTATED RINGERS IV BOLUS
1000.0000 mL | Freq: Once | INTRAVENOUS | Status: AC
  Administered 2023-02-24: 1000 mL via INTRAVENOUS

## 2023-02-24 MED ORDER — THIAMINE MONONITRATE 100 MG PO TABS
100.0000 mg | ORAL_TABLET | Freq: Every day | ORAL | Status: DC
  Administered 2023-02-24 – 2023-03-01 (×6): 100 mg via ORAL
  Filled 2023-02-24 (×6): qty 1

## 2023-02-24 MED ORDER — THIAMINE HCL 100 MG/ML IJ SOLN
100.0000 mg | Freq: Every day | INTRAMUSCULAR | Status: DC
  Filled 2023-02-24 (×2): qty 2

## 2023-02-24 MED ORDER — ADULT MULTIVITAMIN W/MINERALS CH
1.0000 | ORAL_TABLET | Freq: Every day | ORAL | Status: DC
  Administered 2023-02-24 – 2023-03-01 (×6): 1 via ORAL
  Filled 2023-02-24 (×6): qty 1

## 2023-02-24 NOTE — ED Triage Notes (Addendum)
Patient said he is homeless and is having anxiety. He said he has nowhere to go. Does not want to take medication for anxiety because "I dont need it." Patient drank 1/5 of alcohol before arrival. Denies drug use. On parole.

## 2023-02-24 NOTE — ED Notes (Signed)
Pt was given PO ativan at 2230, Dr.Allen came in observed pt and advised to hold 2mg  IV ativan due to pt recent change in mental status. Pt Rass score was about -1.

## 2023-02-24 NOTE — ED Provider Notes (Signed)
[place] EMERGENCY DEPARTMENT AT [facility] Provider Note   CSN: [id number] Arrival date & time: 02/24/23  1829     History Chief Complaint  Patient presents with   Anxiety    Riley [name] is a 32 y.o. male with history of alcohol use disorder, hypokalemia, drug overdose presents emergency room today for evaluation of chest pain since today.  Patient reports has been having chest pain that is nonexertional.  He reports that he has been "walking and drinking", but this has not improved.  Denies any shortness of breath.  He is unsure if he is been having any melena but denies any nausea or vomiting. Denies any belly pain or bowel changes. Denies any trauma to the chest. He told nursing staff that he is homeless and does not have anywhere to go and was feeling anxious. He is supposed to be on daily medication, but doesn't take it because he "doesn't feel like he needs it". He reports he drank 1/5 of EtOH a day. Denies any drug use. Smoker.    Anxiety       Home Medications Prior to Admission medications   Medication Sig Start Date End Date Taking? Authorizing Provider  gabapentin (NEURONTIN) 100 MG capsule Take 1 capsule (100 mg total) by mouth 3 (three) times daily. 02/23/19   Money, Gerlene Burdock, FNP  traZODone (DESYREL) 50 MG tablet Take 1 tablet (50 mg total) by mouth at bedtime and may repeat dose one time if needed. 02/23/19   Money, Gerlene Burdock, FNP  vortioxetine HBr (TRINTELLIX) 10 MG TABS tablet Take 1 tablet (10 mg total) by mouth daily. 02/23/19   Money, Gerlene Burdock, FNP      Allergies    Patient has no known allergies.    Review of Systems   Review of Systems  Physical Exam Updated Vital Signs BP 128/89 (BP Location: Left Arm)   Pulse (!) 110   Temp 97.7 F (36.5 C) (Oral)   Resp (!) 22   Ht 5\' 7"  (1.702 m)   Wt 68 kg   SpO2 98%   BMI 23.49 kg/m  Physical Exam  ED Results / Procedures / Treatments   Labs (all labs ordered are listed, but only abnormal  results are displayed) Labs Reviewed  CBC WITH DIFFERENTIAL/PLATELET  COMPREHENSIVE METABOLIC PANEL  ETHANOL  TROPONIN I (HIGH SENSITIVITY)    EKG None  Radiology No results found.  Procedures Procedures  {Document cardiac monitor, telemetry assessment procedure when appropriate:1}  Medications Ordered in ED Medications  LORazepam (ATIVAN) tablet 1-4 mg (has no administration in time range)    Or  LORazepam (ATIVAN) tablet 0.5 mg (has no administration in time range)  thiamine (VITAMIN B1) tablet 100 mg (has no administration in time range)    Or  thiamine (VITAMIN B1) injection 100 mg (has no administration in time range)  folic acid (FOLVITE) tablet 1 mg (has no administration in time range)  multivitamin with minerals tablet 1 tablet (has no administration in time range)    ED Course/ Medical Decision Making/ A&P   {   Click here for ABCD2, HEART and other calculatorsREFRESH Note before signing :1}                          Medical Decision Making Amount and/or Complexity of Data Reviewed Labs: ordered. Radiology: ordered.  Risk OTC drugs. Prescription drug management.   ***  {Document critical care time when appropriate:1} {Document  review of labs and clinical decision tools ie heart score, Chads2Vasc2 etc:1}  {Document your independent review of radiology images, and any outside records:1} {Document your discussion with family members, caretakers, and with consultants:1} {Document social determinants of health affecting pt's care:1} {Document your decision making why or why not admission, treatments were needed:1} Final Clinical Impression(s) / ED Diagnoses Final diagnoses:  None    Rx / DC Orders ED Discharge Orders     None

## 2023-02-25 DIAGNOSIS — F419 Anxiety disorder, unspecified: Secondary | ICD-10-CM | POA: Diagnosis present

## 2023-02-25 DIAGNOSIS — Z79899 Other long term (current) drug therapy: Secondary | ICD-10-CM | POA: Diagnosis not present

## 2023-02-25 DIAGNOSIS — R110 Nausea: Secondary | ICD-10-CM | POA: Diagnosis not present

## 2023-02-25 DIAGNOSIS — E876 Hypokalemia: Secondary | ICD-10-CM | POA: Diagnosis present

## 2023-02-25 DIAGNOSIS — F10939 Alcohol use, unspecified with withdrawal, unspecified: Secondary | ICD-10-CM | POA: Diagnosis present

## 2023-02-25 DIAGNOSIS — Z833 Family history of diabetes mellitus: Secondary | ICD-10-CM | POA: Diagnosis not present

## 2023-02-25 DIAGNOSIS — F10230 Alcohol dependence with withdrawal, uncomplicated: Secondary | ICD-10-CM | POA: Diagnosis present

## 2023-02-25 DIAGNOSIS — F332 Major depressive disorder, recurrent severe without psychotic features: Secondary | ICD-10-CM | POA: Diagnosis present

## 2023-02-25 DIAGNOSIS — F17210 Nicotine dependence, cigarettes, uncomplicated: Secondary | ICD-10-CM | POA: Diagnosis present

## 2023-02-25 DIAGNOSIS — Y907 Blood alcohol level of 200-239 mg/100 ml: Secondary | ICD-10-CM | POA: Diagnosis present

## 2023-02-25 DIAGNOSIS — B379 Candidiasis, unspecified: Secondary | ICD-10-CM | POA: Diagnosis present

## 2023-02-25 DIAGNOSIS — Z8249 Family history of ischemic heart disease and other diseases of the circulatory system: Secondary | ICD-10-CM | POA: Diagnosis not present

## 2023-02-25 DIAGNOSIS — I161 Hypertensive emergency: Secondary | ICD-10-CM | POA: Diagnosis not present

## 2023-02-25 DIAGNOSIS — F10930 Alcohol use, unspecified with withdrawal, uncomplicated: Secondary | ICD-10-CM | POA: Diagnosis not present

## 2023-02-25 DIAGNOSIS — Z5900 Homelessness unspecified: Secondary | ICD-10-CM | POA: Diagnosis not present

## 2023-02-25 LAB — CBC
HCT: 46 % (ref 39.0–52.0)
Hemoglobin: 16.1 g/dL (ref 13.0–17.0)
MCH: 33.6 pg (ref 26.0–34.0)
MCHC: 35 g/dL (ref 30.0–36.0)
MCV: 96 fL (ref 80.0–100.0)
Platelets: 303 10*3/uL (ref 150–400)
RBC: 4.79 MIL/uL (ref 4.22–5.81)
RDW: 12 % (ref 11.5–15.5)
WBC: 11.8 10*3/uL — ABNORMAL HIGH (ref 4.0–10.5)
nRBC: 0 % (ref 0.0–0.2)

## 2023-02-25 LAB — BASIC METABOLIC PANEL
Anion gap: 11 (ref 5–15)
BUN: <5 mg/dL — ABNORMAL LOW (ref 6–20)
CO2: 23 mmol/L (ref 22–32)
Calcium: 8.5 mg/dL — ABNORMAL LOW (ref 8.9–10.3)
Chloride: 100 mmol/L (ref 98–111)
Creatinine, Ser: 0.75 mg/dL (ref 0.61–1.24)
GFR, Estimated: >60 mL/min (ref >60)
Glucose, Bld: 127 mg/dL — ABNORMAL HIGH (ref 70–99)
Potassium: 3.7 mmol/L (ref 3.5–5.1)
Sodium: 134 mmol/L — ABNORMAL LOW (ref 135–145)

## 2023-02-25 LAB — CREATININE, SERUM
Creatinine, Ser: 0.8 mg/dL (ref 0.61–1.24)
GFR, Estimated: >60 mL/min (ref >60)

## 2023-02-25 LAB — TROPONIN I (HIGH SENSITIVITY): Troponin I (High Sensitivity): 5 ng/L (ref <18)

## 2023-02-25 LAB — HIV ANTIBODY (ROUTINE TESTING W REFLEX): HIV Screen 4th Generation wRfx: NONREACTIVE

## 2023-02-25 MED ORDER — CLONIDINE HCL 0.1 MG/24HR TD PTWK
0.1000 mg | MEDICATED_PATCH | TRANSDERMAL | Status: DC
  Administered 2023-02-25: 0.1 mg via TRANSDERMAL
  Filled 2023-02-25: qty 1

## 2023-02-25 MED ORDER — POTASSIUM CHLORIDE CRYS ER 20 MEQ PO TBCR
40.0000 meq | EXTENDED_RELEASE_TABLET | Freq: Once | ORAL | Status: AC
  Administered 2023-02-25: 40 meq via ORAL
  Filled 2023-02-25: qty 2

## 2023-02-25 MED ORDER — LACTATED RINGERS IV BOLUS
1000.0000 mL | Freq: Once | INTRAVENOUS | Status: AC
  Administered 2023-02-25: 1000 mL via INTRAVENOUS

## 2023-02-25 MED ORDER — ONDANSETRON 4 MG PO TBDP: 4.0000 mg | ORAL_TABLET | Freq: Four times a day (QID) | ORAL | Status: AC | PRN

## 2023-02-25 MED ORDER — ONDANSETRON HCL 4 MG/2ML IJ SOLN
4.0000 mg | Freq: Four times a day (QID) | INTRAMUSCULAR | Status: DC | PRN
  Administered 2023-02-27 – 2023-02-28 (×2): 4 mg via INTRAVENOUS
  Filled 2023-02-25 (×2): qty 2

## 2023-02-25 MED ORDER — CHLORDIAZEPOXIDE HCL 5 MG PO CAPS
25.0000 mg | ORAL_CAPSULE | Freq: Four times a day (QID) | ORAL | Status: AC
  Administered 2023-02-25 – 2023-02-26 (×6): 25 mg via ORAL
  Filled 2023-02-25 (×6): qty 5

## 2023-02-25 MED ORDER — ACETAMINOPHEN 650 MG RE SUPP: 650.0000 mg | Freq: Four times a day (QID) | RECTAL | Status: DC | PRN

## 2023-02-25 MED ORDER — CHLORDIAZEPOXIDE HCL 5 MG PO CAPS
25.0000 mg | ORAL_CAPSULE | Freq: Three times a day (TID) | ORAL | Status: AC
  Administered 2023-02-26 – 2023-02-27 (×3): 25 mg via ORAL
  Filled 2023-02-25 (×3): qty 5

## 2023-02-25 MED ORDER — CHLORDIAZEPOXIDE HCL 5 MG PO CAPS
25.0000 mg | ORAL_CAPSULE | Freq: Every day | ORAL | Status: AC
  Administered 2023-02-28: 25 mg via ORAL
  Filled 2023-02-25: qty 5

## 2023-02-25 MED ORDER — ENOXAPARIN SODIUM 40 MG/0.4ML IJ SOSY
40.0000 mg | PREFILLED_SYRINGE | INTRAMUSCULAR | Status: DC
  Administered 2023-02-25 – 2023-02-27 (×3): 40 mg via SUBCUTANEOUS
  Filled 2023-02-25 (×4): qty 0.4

## 2023-02-25 MED ORDER — ACETAMINOPHEN 325 MG PO TABS
650.0000 mg | ORAL_TABLET | Freq: Four times a day (QID) | ORAL | Status: DC | PRN
  Administered 2023-02-28: 650 mg via ORAL
  Filled 2023-02-25: qty 2

## 2023-02-25 MED ORDER — LOPERAMIDE HCL 2 MG PO CAPS: 2.0000 mg | ORAL_CAPSULE | ORAL | Status: AC | PRN

## 2023-02-25 MED ORDER — TRAZODONE HCL 50 MG PO TABS: 25.0000 mg | ORAL_TABLET | Freq: Every evening | ORAL | Status: DC | PRN

## 2023-02-25 MED ORDER — ONDANSETRON HCL 4 MG PO TABS: 4.0000 mg | ORAL_TABLET | Freq: Four times a day (QID) | ORAL | Status: DC | PRN

## 2023-02-25 MED ORDER — CHLORDIAZEPOXIDE HCL 5 MG PO CAPS
25.0000 mg | ORAL_CAPSULE | ORAL | Status: AC
  Administered 2023-02-27 – 2023-02-28 (×2): 25 mg via ORAL
  Filled 2023-02-25 (×2): qty 5

## 2023-02-25 MED ORDER — HYDROXYZINE HCL 25 MG PO TABS
25.0000 mg | ORAL_TABLET | Freq: Four times a day (QID) | ORAL | Status: AC | PRN
  Administered 2023-02-26 – 2023-02-28 (×4): 25 mg via ORAL
  Filled 2023-02-25 (×4): qty 1

## 2023-02-25 MED ORDER — ALBUTEROL SULFATE (2.5 MG/3ML) 0.083% IN NEBU: 2.5000 mg | INHALATION_SOLUTION | RESPIRATORY_TRACT | Status: DC | PRN

## 2023-02-25 MED ORDER — HYDRALAZINE HCL 20 MG/ML IJ SOLN
5.0000 mg | Freq: Four times a day (QID) | INTRAMUSCULAR | Status: DC | PRN
  Administered 2023-02-25: 5 mg via INTRAVENOUS
  Filled 2023-02-25: qty 1

## 2023-02-25 NOTE — TOC Initial Note (Signed)
Transition of Care [facility]) - Initial/Assessment Note    Patient Details  Name: Riley [name] MRN: [id number] Date of Birth: Feb 24, 1991  [facility] CM/SW Contact:    Lanier Clam, RN Phone Number: 02/25/2023, 3:44 PM  Clinical Narrative: Per nsg patient sleeping currently-wil f/u in am on d/c plan. Attempted to contact Junious Dresser per patient request friend- [phone]-spoke to Connie-states she has her car & was trying to help patient but he needs to stop drinking.she has gone to [place]-provided her with patient's rm tel#. Will offer patient homeless shelter resources, otpt rehab resources, & pcp.Will provide bus pass if needed @ d/c.               Expected Discharge Plan: Homeless Shelter Barriers to Discharge: Continued Medical Work up   Patient Goals and CMS Choice            Expected Discharge Plan and Services                                              Prior Living Arrangements/Services                       Activities of Daily Living Home Assistive Devices/Equipment: None ADL Screening (condition at time of admission) Patient's cognitive ability adequate to safely complete daily activities?: No Is the patient deaf or have difficulty hearing?: No Does the patient have difficulty seeing, even when wearing glasses/contacts?: No Does the patient have difficulty concentrating, remembering, or making decisions?: Yes Patient able to express need for assistance with ADLs?: Yes Does the patient have difficulty dressing or bathing?: No Independently performs ADLs?: No Communication: Independent Dressing (OT): Independent Grooming: Independent Feeding: Independent Bathing: Independent Toileting: Independent In/Out Bed: Needs assistance Is this a change from baseline?: Change from baseline, expected to last <3 days Walks in Home: Independent Does the patient have difficulty walking or climbing stairs?: No Weakness of Legs:  None Weakness of Arms/Hands: None  Permission Sought/Granted                  Emotional Assessment              Admission diagnosis:  Alcohol withdrawal (HCC) [F10.939] Alcohol withdrawal syndrome without complication (HCC) [F10.930] Patient Active Problem List   Diagnosis Date Noted   Alcohol withdrawal (HCC) 02/25/2023   Major depressive disorder, recurrent severe without psychotic features (HCC) 02/21/2019   Alcohol use disorder, severe, dependence (HCC) 12/11/2018   Suicide attempt (HCC)    Overdose 12/03/2018   Tachypnea    Drug overdose    Methadone overdose (HCC) 06/11/2018   Alcohol abuse with alcohol-induced mood disorder (HCC) 12/30/2017   Severe recurrent major depression without psychotic features (HCC) 12/21/2017   Alcoholism (HCC) 12/12/2017   Hypokalemia 12/12/2017   Suicidal ideations 12/12/2017   Hypoglycemia due to insulin 12/12/2017   Alcohol abuse with intoxication (HCC) 12/12/2017   Hypoglycemia 12/12/2017   Insulin overdose    PCP:  Patient, No Pcp Per Pharmacy:   [facility] [address] [place] - [address] [place] [address] Phone: [phone] Fax: [phone]  [facility] - [place], [place] - [address] [place] [address] Phone: [phone] Fax: [phone]     Social Determinants of Health (  SDOH) Social History: SDOH Screenings   Food Insecurity: Food Insecurity Present (02/25/2023)  Housing: High Risk (02/25/2023)  Transportation Needs: No Transportation Needs (02/25/2023)  Utilities: Not At Risk (02/25/2023)  Alcohol Screen: Medium Risk (02/21/2019)  Depression (PHQ2-9): Low Risk  (02/20/2019)  Financial Resource Strain: High Risk (02/21/2019)  Physical Activity: Unknown (02/21/2019)  Social Connections: Unknown (02/21/2019)  Stress: Stress Concern Present (02/21/2019)  Tobacco Use: High Risk (02/24/2023)   SDOH  Interventions:     Readmission Risk Interventions     No data to display

## 2023-02-25 NOTE — Progress Notes (Signed)
  DAY ZERO NOTE    Riley [name]  ZOX:[id number] DOB: 1991/06/21 DOA: 02/24/2023 PCP: Patient, No Pcp Per   Brief Narrative:  32yo M admitting this morning with altered mental status concerning for withdrawal - see HP from earlier today.  Concern for alcohol withdrawal with concurrent psychosis -continue CIWA protocol and supportive care  HTN emergency with altered mental status, likely secondary to above withdrawals, additional as needed medications given today  THC positive -will discuss cessation once more awake alert and oriented  Elevated AST/ALT -in the setting of alcohol abuse  Leukemoid reaction-secondary to above    Assessment & Plan:   Principal Problem:   Alcohol withdrawal (HCC) Active Problems:   Severe recurrent major depression without psychotic features (HCC)   Alcohol use disorder, severe, dependence (HCC)      Scheduled Meds:  chlordiazePOXIDE  25 mg Oral QID   Followed by   Melene Muller ON 02/26/2023] chlordiazePOXIDE  25 mg Oral TID   Followed by   Melene Muller ON 02/27/2023] chlordiazePOXIDE  25 mg Oral BH-qamhs   Followed by   Melene Muller ON 02/28/2023] chlordiazePOXIDE  25 mg Oral Daily   enoxaparin (LOVENOX) injection  40 mg Subcutaneous Q24H   folic acid  1 mg Oral Daily   LORazepam  2 mg Intravenous Once   multivitamin with minerals  1 tablet Oral Daily   thiamine  100 mg Oral Daily   Or   thiamine  100 mg Intravenous Daily   Continuous Infusions:   LOS: 0 days   Time spent:  Azucena Fallen, DO [facility]  If 7PM-7AM, please contact night-coverage [email or web address]  02/25/2023, 8:00 AM

## 2023-02-25 NOTE — H&P (Signed)
History and Physical  Riley [name] ZOX:[id number] DOB: 03-03-1991 DOA: 02/24/2023  PCP: Patient, No Pcp Per   Chief Complaint: Chest pain  HPI: Riley [name] is a 32 y.o. male with medical history significant for alcohol use disorder, hypokalemia, prior drug overdose presented to the emergency room initially complaining of chest pain, also told nursing staff that he is anxious and has nowhere to go.  Initial workup was unremarkable, but patient later admitted to drinking 1/5 of vodka prior to presenting to the emergency department, and on further evaluation after several hours in the ER, became tremulous, tachycardic and somewhat confused.  He was given oral Ativan, and hospitalist was contacted for admission for suspected alcohol withdrawal.  Patient denies any fevers, chills, nausea, vomiting.  Currently denies chest pain.  Told the nurse that he started having chest pain yesterday, told another nurse chest pain has been happening for the last 6 months to year.  Received a total of 4 mg of Ativan prior to my arrival, due to elevated CIWA scores.  Review of Systems: Please see HPI for pertinent positives and negatives. A complete 10 system review of systems are otherwise negative.  Past Medical History:  Diagnosis Date   Alcoholism [facility])    History reviewed. No pertinent surgical history.  Social History:  reports that he has been smoking cigarettes. He has been smoking an average of 1.5 packs per day. He has never used smokeless tobacco. He reports current alcohol use. He reports current drug use. Frequency: 2.00 times per week. Drug: Marijuana.   No Known Allergies  Family History  Problem Relation Age of Onset   Hypertension Other    Diabetes Other      Prior to Admission medications   Medication Sig Start Date End Date Taking? Authorizing Provider  gabapentin (NEURONTIN) 100 MG capsule Take 1 capsule (100 mg total) by mouth 3 (three) times daily. 02/23/19   Money, Gerlene Burdock, FNP   traZODone (DESYREL) 50 MG tablet Take 1 tablet (50 mg total) by mouth at bedtime and may repeat dose one time if needed. 02/23/19   Money, Gerlene Burdock, FNP  vortioxetine HBr (TRINTELLIX) 10 MG TABS tablet Take 1 tablet (10 mg total) by mouth daily. 02/23/19   Money, Gerlene Burdock, FNP    Physical Exam: BP (!) 156/104   Pulse 74   Temp 97.9 F (36.6 C) (Oral)   Resp (!) 22   Ht 5\' 7"  (1.702 m)   Wt 68 kg   SpO2 96%   BMI 23.49 kg/m   General: Patient sleeping on arrival, diaphoretic, tachypneic, easily arousable, oriented, follows commands and answers questions appropriately, but falls back asleep quickly Eyes: EOMI, clear conjuctivae, [place] sclerea Neck: supple, no masses, trachea mildline  Cardiovascular: RRR, no murmurs or rubs, no peripheral edema  Respiratory: clear to auscultation bilaterally, no wheezes, no crackles, slight tachypnea Abdomen: soft, nontender, nondistended, normal bowel tones heard  Skin: dry, no rashes  Musculoskeletal: no joint effusions, normal range of motion  Psychiatric: appropriate affect, normal speech  Neurologic: extraocular muscles intact, clear speech, moving all extremities with intact sensorium          Labs on Admission:  Basic Metabolic Panel: Recent Labs  Lab 02/24/23 2006  NA 141  K 3.3*  CL 105  CO2 24  GLUCOSE 112*  BUN 7  CREATININE 1.03  CALCIUM 8.2*   Liver Function Tests: Recent Labs  Lab 02/24/23 2006  AST 43*  ALT 52*  ALKPHOS 117  BILITOT 0.3  PROT 7.3  ALBUMIN 3.6   No results for input(s): "LIPASE", "AMYLASE" in the last 168 hours. No results for input(s): "AMMONIA" in the last 168 hours. CBC: Recent Labs  Lab 02/24/23 2006  WBC 6.2  NEUTROABS 3.5  HGB 14.6  HCT 42.3  MCV 96.4  PLT 294   Cardiac Enzymes: No results for input(s): "CKTOTAL", "CKMB", "CKMBINDEX", "TROPONINI" in the last 168 hours.  BNP (last 3 results) No results for input(s): "BNP" in the last 8760 hours.  ProBNP (last 3 results) No  results for input(s): "PROBNP" in the last 8760 hours.  CBG: Recent Labs  Lab 02/24/23 2323  GLUCAP 213*    Radiological Exams on Admission: DG Chest 2 View  Result Date: 02/24/2023 CLINICAL DATA:  Chest pain, anxiety EXAM: CHEST - 2 VIEW COMPARISON:  05/09/2022 FINDINGS: The heart size and mediastinal contours are within normal limits. Both lungs are clear. The visualized skeletal structures are unremarkable. IMPRESSION: No active cardiopulmonary disease. Electronically Signed   By: Sharlet Salina M.D.   On: 02/24/2023 19:42    Assessment/Plan This is a 32 year old gentleman with a history of depression, hypokalemia alcohol dependency, alcohol induced psychosis not on any medications being admitted to the hospital with likely alcohol withdrawal syndrome.  Initially presented with complaints of feeling anxious, and later mention chest pain.  He has been quite inconsistent with his story.  Currently somnolent, most likely due to having received a total of 4 mg of Ativan. -Inpatient admission -Regular diet -Librium taper, with Ativan per CIWA protocol -Continue thiamine, folate, multivitamin  Hypokalemia-replete orally, recheck with morning labs  Active Problems:   Severe recurrent major depression without psychotic features (HCC)-patient currently not on any medications, as he did not feel that he needs it   Alcohol use disorder, severe, dependence (HCC)-TOC consult  DVT prophylaxis: Lovenox     Code Status: Full Code  Consults called: None  Admission status: The appropriate patient status for this patient is INPATIENT. Inpatient status is judged to be reasonable and necessary in order to provide the required intensity of service to ensure the patient's safety. The patient's presenting symptoms, physical exam findings, and initial radiographic and laboratory data in the context of their chronic comorbidities is felt to place them at high risk for further clinical deterioration.  Furthermore, it is not anticipated that the patient will be medically stable for discharge from the hospital within 2 midnights of admission.    I certify that at the point of admission it is my clinical judgment that the patient will require inpatient hospital care spanning beyond 2 midnights from the point of admission due to high intensity of service, high risk for further deterioration and high frequency of surveillance required   Time spent: 53 minutes  Benicia Bergevin Sharlette Dense MD [facility] Pager [phone]  If 7PM-7AM, please contact night-coverage [email or web address] Password [facility]  02/25/2023, 12:31 AM

## 2023-02-26 DIAGNOSIS — F10930 Alcohol use, unspecified with withdrawal, uncomplicated: Secondary | ICD-10-CM | POA: Diagnosis not present

## 2023-02-26 DIAGNOSIS — F332 Major depressive disorder, recurrent severe without psychotic features: Secondary | ICD-10-CM | POA: Diagnosis not present

## 2023-02-26 LAB — COMPREHENSIVE METABOLIC PANEL
ALT: 36 U/L (ref 0–44)
AST: 25 U/L (ref 15–41)
Albumin: 3.4 g/dL — ABNORMAL LOW (ref 3.5–5.0)
Alkaline Phosphatase: 126 U/L (ref 38–126)
Anion gap: 10 (ref 5–15)
BUN: 5 mg/dL — ABNORMAL LOW (ref 6–20)
CO2: 25 mmol/L (ref 22–32)
Calcium: 8.7 mg/dL — ABNORMAL LOW (ref 8.9–10.3)
Chloride: 98 mmol/L (ref 98–111)
Creatinine, Ser: 0.83 mg/dL (ref 0.61–1.24)
GFR, Estimated: >60 mL/min (ref >60)
Glucose, Bld: 113 mg/dL — ABNORMAL HIGH (ref 70–99)
Potassium: 3.3 mmol/L — ABNORMAL LOW (ref 3.5–5.1)
Sodium: 133 mmol/L — ABNORMAL LOW (ref 135–145)
Total Bilirubin: 0.9 mg/dL (ref 0.3–1.2)
Total Protein: 6.9 g/dL (ref 6.5–8.1)

## 2023-02-26 LAB — CBC
HCT: 44.6 % (ref 39.0–52.0)
Hemoglobin: 15.6 g/dL (ref 13.0–17.0)
MCH: 33.7 pg (ref 26.0–34.0)
MCHC: 35 g/dL (ref 30.0–36.0)
MCV: 96.3 fL (ref 80.0–100.0)
Platelets: 266 10*3/uL (ref 150–400)
RBC: 4.63 MIL/uL (ref 4.22–5.81)
RDW: 12 % (ref 11.5–15.5)
WBC: 10.6 10*3/uL — ABNORMAL HIGH (ref 4.0–10.5)
nRBC: 0 % (ref 0.0–0.2)

## 2023-02-26 MED ORDER — POTASSIUM CHLORIDE CRYS ER 20 MEQ PO TBCR
40.0000 meq | EXTENDED_RELEASE_TABLET | Freq: Three times a day (TID) | ORAL | Status: AC
  Administered 2023-02-26 (×2): 40 meq via ORAL
  Filled 2023-02-26 (×2): qty 2

## 2023-02-26 NOTE — Plan of Care (Signed)

## 2023-02-26 NOTE — Progress Notes (Signed)
PROGRESS NOTE    Riley [name]  WUJ:[id number] DOB: [birth year] DOA: 02/24/2023 PCP: Patient, No Pcp Per   Brief Narrative:  Riley [name] is a 32 y.o. male with medical history significant for alcohol use disorder, hypokalemia, prior drug overdose presented to the emergency room initially complaining of chest pain, also told nursing staff that he is anxious and has nowhere to go.  Initial workup was unremarkable, but patient later admitted to drinking 1/5 of vodka prior to presenting to the emergency department, and on further evaluation after several hours in the ER, became tremulous, tachycardic and somewhat confused. Hospitalist called for acute alcohol withdrawals and possible acute psychosis.  Assessment & Plan:   Principal Problem:   Alcohol withdrawal (HCC) Active Problems:   Severe recurrent major depression without psychotic features (HCC)   Alcohol use disorder, severe, dependence (HCC)   Acute alcohol withdrawal with concurrent psychosis, resolving Alcohol abuse/severe dependence - Continue CIWA/librium later - markedly improving - Continue vitamin supplementation per guidelines - Will need to follow up outpatient (CM/SW to assist with support information)  HypoKalemia - Secondary to above and poor PO intake - Follow am labs  Major depression without psychotic features - Stable - not currently on medication - recommend follow up with PCP/Psych in the outpatient setting  DVT prophylaxis: Lovenox Code Status: Full Family Communication: None present  Status is: Inpt  Dispo: The patient is from: Homeless              Anticipated d/c is to: TBD              Anticipated d/c date is: 48-72h              Patient currently NOT medically stable for discharge  Consultants:  None  Procedures:  None  Antimicrobials:  None   Subjective: No acute issues/events overnight - mental status improving -essentially back to baseline but ongoing tremors  Objective: Vitals:    02/25/23 1402 02/25/23 1800 02/25/23 1957 02/26/23 0429  BP: (!) 147/109 (!) 152/99 (!) 156/100 (!) 128/106  Pulse: (!) 53 (!) 58 (!) 54 65  Resp: 18 20 19 20   Temp:  98.2 F (36.8 C) 98 F (36.7 C) 98 F (36.7 C)  TempSrc:  Oral Oral Oral  SpO2: 99% 99% 100% 100%  Weight:      Height:        Intake/Output Summary (Last 24 hours) at 02/26/2023 0732 Last data filed at 02/26/2023 0103 Gross per 24 hour  Intake 460 ml  Output 2750 ml  Net -2290 ml   Filed Weights   02/24/23 1836 02/25/23 0247  Weight: 68 kg 70.8 kg    Examination:  General exam: Appears calm and comfortable  Respiratory system: Clear to auscultation. Respiratory effort normal. Cardiovascular system: S1 & S2 heard, RRR. No JVD, murmurs, rubs, gallops or clicks. No pedal edema. Gastrointestinal system: Abdomen is nondistended, soft and nontender. No organomegaly or masses felt. Normal bowel sounds heard. Central nervous system: Alert and oriented. No focal neurological deficits. Extremities: Symmetric 5 x 5 power - tremulous BUE  Data Reviewed: I have personally reviewed following labs and imaging studies  CBC: Recent Labs  Lab 02/24/23 2006 02/25/23 0546 02/26/23 0523  WBC 6.2 11.8* 10.6*  NEUTROABS 3.5  --   --   HGB 14.6 16.1 15.6  HCT 42.3 46.0 44.6  MCV 96.4 96.0 96.3  PLT 294 303 266   Basic Metabolic Panel: Recent Labs  Lab 02/24/23 2006 02/25/23  8295 02/25/23 0547 02/26/23 0523  NA 141 134*  --  133*  K 3.3* 3.7  --  3.3*  CL 105 100  --  98  CO2 24 23  --  25  GLUCOSE 112* 127*  --  113*  BUN 7 <5*  --  5*  CREATININE 1.03 0.75 0.80 0.83  CALCIUM 8.2* 8.5*  --  8.7*   GFR: Estimated Creatinine Clearance: 121.7 mL/min (by C-G formula based on SCr of 0.83 mg/dL). Liver Function Tests: Recent Labs  Lab 02/24/23 2006 02/26/23 0523  AST 43* 25  ALT 52* 36  ALKPHOS 117 126  BILITOT 0.3 0.9  PROT 7.3 6.9  ALBUMIN 3.6 3.4*   CBG: Recent Labs  Lab 02/24/23 2323  GLUCAP  213*    No results found for this or any previous visit (from the past 240 hour(s)).   Radiology Studies: DG Chest 2 View  Result Date: 02/24/2023 CLINICAL DATA:  Chest pain, anxiety EXAM: CHEST - 2 VIEW COMPARISON:  05/09/2022 FINDINGS: The heart size and mediastinal contours are within normal limits. Both lungs are clear. The visualized skeletal structures are unremarkable. IMPRESSION: No active cardiopulmonary disease. Electronically Signed   By: Sharlet Salina M.D.   On: 02/24/2023 19:42    Scheduled Meds:  chlordiazePOXIDE  25 mg Oral QID   Followed by   chlordiazePOXIDE  25 mg Oral TID   Followed by   Melene Muller ON 02/27/2023] chlordiazePOXIDE  25 mg Oral BH-qamhs   Followed by   Melene Muller ON 02/28/2023] chlordiazePOXIDE  25 mg Oral Daily   cloNIDine  0.1 mg Transdermal Weekly   enoxaparin (LOVENOX) injection  40 mg Subcutaneous Q24H   folic acid  1 mg Oral Daily   LORazepam  2 mg Intravenous Once   multivitamin with minerals  1 tablet Oral Daily   thiamine  100 mg Oral Daily   Or   thiamine  100 mg Intravenous Daily   Continuous Infusions:   LOS: 1 day   Time spent:  Azucena Fallen, DO [facility]  If 7PM-7AM, please contact night-coverage [email or web address]  02/26/2023, 7:32 AM

## 2023-02-27 DIAGNOSIS — F10930 Alcohol use, unspecified with withdrawal, uncomplicated: Secondary | ICD-10-CM | POA: Diagnosis not present

## 2023-02-27 LAB — COMPREHENSIVE METABOLIC PANEL
ALT: 36 U/L (ref 0–44)
AST: 29 U/L (ref 15–41)
Albumin: 3.6 g/dL (ref 3.5–5.0)
Alkaline Phosphatase: 123 U/L (ref 38–126)
Anion gap: 11 (ref 5–15)
BUN: 9 mg/dL (ref 6–20)
CO2: 23 mmol/L (ref 22–32)
Calcium: 9 mg/dL (ref 8.9–10.3)
Chloride: 99 mmol/L (ref 98–111)
Creatinine, Ser: 0.79 mg/dL (ref 0.61–1.24)
GFR, Estimated: >60 mL/min (ref >60)
Glucose, Bld: 64 mg/dL — ABNORMAL LOW (ref 70–99)
Potassium: 4 mmol/L (ref 3.5–5.1)
Sodium: 133 mmol/L — ABNORMAL LOW (ref 135–145)
Total Bilirubin: 0.9 mg/dL (ref 0.3–1.2)
Total Protein: 7.2 g/dL (ref 6.5–8.1)

## 2023-02-27 LAB — CBC
HCT: 44.5 % (ref 39.0–52.0)
Hemoglobin: 15.4 g/dL (ref 13.0–17.0)
MCH: 33.8 pg (ref 26.0–34.0)
MCHC: 34.6 g/dL (ref 30.0–36.0)
MCV: 97.8 fL (ref 80.0–100.0)
Platelets: 236 10*3/uL (ref 150–400)
RBC: 4.55 MIL/uL (ref 4.22–5.81)
RDW: 12.5 % (ref 11.5–15.5)
WBC: 8.7 10*3/uL (ref 4.0–10.5)
nRBC: 0 % (ref 0.0–0.2)

## 2023-02-27 MED ORDER — NICOTINE 14 MG/24HR TD PT24
14.0000 mg | MEDICATED_PATCH | Freq: Every day | TRANSDERMAL | Status: DC
  Administered 2023-02-27 – 2023-02-28 (×2): 14 mg via TRANSDERMAL
  Filled 2023-02-27 (×3): qty 1

## 2023-02-27 MED ORDER — PANTOPRAZOLE SODIUM 40 MG PO TBEC
40.0000 mg | DELAYED_RELEASE_TABLET | Freq: Every day | ORAL | Status: DC
  Administered 2023-02-27 – 2023-03-01 (×3): 40 mg via ORAL
  Filled 2023-02-27 (×3): qty 1

## 2023-02-27 MED ORDER — LORAZEPAM 2 MG/ML IJ SOLN
1.0000 mg | Freq: Once | INTRAMUSCULAR | Status: AC
  Administered 2023-02-27: 2 mg via INTRAVENOUS
  Filled 2023-02-27: qty 1

## 2023-02-27 MED ORDER — SODIUM CHLORIDE 0.9 % IV SOLN: INTRAVENOUS | Status: DC

## 2023-02-27 MED ORDER — NYSTATIN 100000 UNIT/ML MT SUSP
5.0000 mL | Freq: Four times a day (QID) | OROMUCOSAL | Status: DC
  Administered 2023-02-27 – 2023-03-01 (×9): 500000 [IU] via ORAL
  Filled 2023-02-27 (×9): qty 5

## 2023-02-27 NOTE — Progress Notes (Signed)
Notified MD Rizwan in regards to patient not wanting IV fluids to be administered. Educated patient on the importance of fluid administration. Patient drinking fluids and making adequate urine. Will continue to monitor assess.

## 2023-02-27 NOTE — Significant Event (Signed)
Rapid Response Event Note   Reason for Call :  Requested by day nurse and night charge nurse to assess patient for excessive withdrawal symptoms.  Initial Focused Assessment:  Patient sitting up in bed rubbing washcloth on his feet that is apparently got alcohol gel on it. Patient states his athlete's foot is his main problem and at home he soaks his feet in clorox water, but here he needs alcohol gel to rub on his feet. Patient does have slightly pressured speech, but denies current visual or auditory hallucinations. Anxiety is inferred due to the heart rate in 120-140 range. Patient is ambulatory in the room which may also account for elevated heart rate. Patient denies nausea. Tremors in hands and arms are observed. No current agitation or combativeness. Bedside nurses from day and night shifts state their concerns are that he is escalating in withdrawal symptoms and trying to drink the alcohol gel.   Interventions:  On call provider ordered additional dose Ativan. Advised to administer this and continue CIWA assessments. No hands on assessment done as to not agitate the patient, but spent time talking with him and observational assessment for withdrawal.   Plan of Care:  Monitor, document CIWA scores, and administer prescribed withdrawal meds as often as needed. Will transfer to SD if condition progresses. May also need some type of sitter to prevent drinking alcohol gel.  Event Summary:   MD Notified: Anthoney Harada, NP Call Time: 1950 Arrival Time: 2000 End Time: 2100  Lamona Curl, RN

## 2023-02-27 NOTE — Progress Notes (Signed)
Notified Jeris Penta NP with hospitalist team in regards to patient having increasing withdrawal symptoms especially agitation and anxiety. Also, Patient is has had an uptrend in tachycardia and tachypnea. Patient is not having any auditory or visual hallucinations at this time but patient is having signs of paranoia, and verbal aggressiveness toward staff. NP Jeris Penta NP will come to assess patient at bedside. Updated night shift team in regards to situation.

## 2023-02-27 NOTE — Progress Notes (Signed)
[facility] Progress Note  Patient: Riley [name]     ZOX:[id number]  DOA: 02/24/2023   PCP: Patient, No Pcp Per       Brief hospital course: This is a 32 year old male with history of alcohol abuse who presents to the ED with chest pain.  He appears to be in alcohol withdrawal with tremors, tachycardia and confusion and was admitted for treatment of alcohol withdrawal.  He admits to drinking 1/5 of vodka every day.  Subjective:  Today he states he feels anxious, has a poor appetite, has been having some dry heaves and feels very weak and tremulous.  He is asking for something for anxiety. Assessment and Plan: Principal Problem:   Alcohol withdrawal -Continue Librium, CIWA scale and Ativan as needed -Continue folic acid, clonidine patch, thiamine  Active Problems: Thrush - Start nystatin and follow  Nausea/dry heaving/poor oral intake - As needed Zofran is being given, start normal saline (is declining this)-start daily PPI and continue to treat overall withdrawal symptoms  Hypokalemia - Has been adequately replaced  Nicotine abuse - Admits to smoking 1 pack/day - Nicotine patch has been started      Code Status: Full Code Consultants: none Level of Care: Level of care: Telemetry Total time on patient care: 35 min DVT prophylaxis:  enoxaparin (LOVENOX) injection 40 mg Start: 02/25/23 1000 SCDs Start: 02/25/23 0030     Objective:   Vitals:   02/26/23 1102 02/26/23 2128 02/27/23 0554 02/27/23 0800  BP: 96/74 113/75 119/84   Pulse: 86 81 77   Resp:  16 18 (!) 21  Temp: 98.9 F (37.2 C) 98.3 F (36.8 C) 97.8 F (36.6 C)   TempSrc: Oral Oral Oral   SpO2: 100% 100% 99%   Weight:      Height:       Filed Weights   02/24/23 1836 02/25/23 0247  Weight: 68 kg 70.8 kg   Exam: General exam: WD, WN male HEENT: oral mucosa moist Respiratory system: Clear to auscultation.  Cardiovascular system: S1 & S2 heard  Gastrointestinal system: Abdomen soft, mild  epigastric tenderness, nondistended. Normal bowel sounds   Extremities: No cyanosis, clubbing or edema Psychiatry:  anxious      CBC: Recent Labs  Lab 02/24/23 2006 02/25/23 0546 02/26/23 0523 02/27/23 0503  WBC 6.2 11.8* 10.6* 8.7  NEUTROABS 3.5  --   --   --   HGB 14.6 16.1 15.6 15.4  HCT 42.3 46.0 44.6 44.5  MCV 96.4 96.0 96.3 97.8  PLT 294 303 266 236   Basic Metabolic Panel: Recent Labs  Lab 02/24/23 2006 02/25/23 0546 02/25/23 0547 02/26/23 0523 02/27/23 0503  NA 141 134*  --  133* 133*  K 3.3* 3.7  --  3.3* 4.0  CL 105 100  --  98 99  CO2 24 23  --  25 23  GLUCOSE 112* 127*  --  113* 64*  BUN 7 <5*  --  5* 9  CREATININE 1.03 0.75 0.80 0.83 0.79  CALCIUM 8.2* 8.5*  --  8.7* 9.0   GFR: Estimated Creatinine Clearance: 126.2 mL/min (by C-G formula based on SCr of 0.79 mg/dL).  Scheduled Meds:  chlordiazePOXIDE  25 mg Oral TID   Followed by   chlordiazePOXIDE  25 mg Oral BH-qamhs   Followed by   Melene Muller ON 02/28/2023] chlordiazePOXIDE  25 mg Oral Daily   cloNIDine  0.1 mg Transdermal Weekly   enoxaparin (LOVENOX) injection  40 mg Subcutaneous Q24H   folic  acid  1 mg Oral Daily   LORazepam  2 mg Intravenous Once   multivitamin with minerals  1 tablet Oral Daily   nicotine  14 mg Transdermal Daily   nystatin  5 mL Oral QID   thiamine  100 mg Oral Daily   Or   thiamine  100 mg Intravenous Daily   Continuous Infusions:  sodium chloride     Imaging and lab data was personally reviewed No results found.  LOS: 2 days   Author: Calvert Cantor  02/27/2023 1:31 PM  To contact [facility]>   Check the care team in [facility] and look for the attending/consulting [facility] provider listed  Log into [email or web address] and use [place]'s universal password   Go to> "[facility]"  and find provider  If you still have difficulty reaching the provider, please page the [place] (Director on Call) for the Hospitalists listed on amion

## 2023-02-28 LAB — COMPREHENSIVE METABOLIC PANEL
ALT: 36 U/L (ref 0–44)
AST: 32 U/L (ref 15–41)
Albumin: 3.4 g/dL — ABNORMAL LOW (ref 3.5–5.0)
Alkaline Phosphatase: 128 U/L — ABNORMAL HIGH (ref 38–126)
Anion gap: 11 (ref 5–15)
BUN: <5 mg/dL — ABNORMAL LOW (ref 6–20)
CO2: 21 mmol/L — ABNORMAL LOW (ref 22–32)
Calcium: 8.6 mg/dL — ABNORMAL LOW (ref 8.9–10.3)
Chloride: 103 mmol/L (ref 98–111)
Creatinine, Ser: 0.67 mg/dL (ref 0.61–1.24)
GFR, Estimated: >60 mL/min (ref >60)
Glucose, Bld: 92 mg/dL (ref 70–99)
Potassium: 3.3 mmol/L — ABNORMAL LOW (ref 3.5–5.1)
Sodium: 135 mmol/L (ref 135–145)
Total Bilirubin: 1.2 mg/dL (ref 0.3–1.2)
Total Protein: 7.3 g/dL (ref 6.5–8.1)

## 2023-02-28 LAB — CBC
HCT: 45.5 % (ref 39.0–52.0)
Hemoglobin: 15.5 g/dL (ref 13.0–17.0)
MCH: 33.4 pg (ref 26.0–34.0)
MCHC: 34.1 g/dL (ref 30.0–36.0)
MCV: 98.1 fL (ref 80.0–100.0)
Platelets: 221 10*3/uL (ref 150–400)
RBC: 4.64 MIL/uL (ref 4.22–5.81)
RDW: 12.5 % (ref 11.5–15.5)
WBC: 7.8 10*3/uL (ref 4.0–10.5)
nRBC: 0 % (ref 0.0–0.2)

## 2023-02-28 LAB — PHOSPHORUS: Phosphorus: 4.4 mg/dL (ref 2.5–4.6)

## 2023-02-28 LAB — MAGNESIUM: Magnesium: 1.9 mg/dL (ref 1.7–2.4)

## 2023-02-28 MED ORDER — LORAZEPAM 2 MG/ML IJ SOLN
1.0000 mg | Freq: Once | INTRAMUSCULAR | Status: AC
  Administered 2023-02-28: 3 mg via INTRAVENOUS
  Filled 2023-02-28: qty 2

## 2023-02-28 NOTE — Progress Notes (Signed)
[facility]  - [place] [facility]   PATIENT NAME: Riley [name]    MR#:  [id number]  DATE OF BIRTH:  20-Oct-1991  SUBJECTIVE:  per RN patient pulled his IV out telemetry out. He was agitated. Patient currently received Librium and Ativan and is sedated fast asleep during my evaluation. Sitter at bedside. Per RN patient has been asking for bottles of hand sanitizer. He is known to drink rubbing alcohol.    VITALS:  Blood pressure 133/60, pulse 61, temperature 98.2 F (36.8 C), resp. rate 20, height 5\' 7"  (1.702 m), weight 70.8 kg, SpO2 99 %.  PHYSICAL EXAMINATION:  omitted exam due to patient being sedated GENERAL:  32 y.o.-year-old patient with no acute distress.  LUNGS: Normal breath sounds bilaterally, no wheezing CARDIOVASCULAR: S1, S2 normal. No murmur tachycardia ABDOMEN: Soft, nontender, nondistended. Bowel sounds present.  EXTREMITIES: No  edema b/l.    NEUROLOGIC: nonfocal  patient is sedated SKIN: No obvious rash, lesion, or ulcer.   LABORATORY PANEL:  CBC Recent Labs  Lab 02/28/23 0517  WBC 7.8  HGB 15.5  HCT 45.5  PLT 221    Chemistries  Recent Labs  Lab 02/28/23 0517  NA 135  K 3.3*  CL 103  CO2 21*  GLUCOSE 92  BUN <5*  CREATININE 0.67  CALCIUM 8.6*  MG 1.9  AST 32  ALT 36  ALKPHOS 128*  BILITOT 1.2    Assessment and Plan  32 year old male with history of alcohol abuse who presents to the ED with chest pain.  He appears to be in alcohol withdrawal with tremors, tachycardia and confusion and was admitted for treatment of alcohol withdrawal.  He admits to drinking 1/5 of vodka every day.   Assessment and Plan:  Acute alcohol withdrawal chronic alcoholism -Continue Librium, CIWA scale and Ativan as needed -Continue folic acid, clonidine patch, thiamine  Thrush - Start nystatin and follow   Nausea/dry heaving/poor oral intake - As needed Zofran is being given, start normal saline (is declining this)-start daily PPI and  continue to treat overall withdrawal symptoms   Hypokalemia - replace potassium   Nicotine abuse - Admits to smoking 1 pack/day - Nicotine patch has been started   Child psychotherapist for discharge planning      Procedures: none Family communication : none today Consults : CODE STATUS: full DVT Prophylaxis : enoxaparin Level of care: Telemetry Status is: Inpatient Remains inpatient appropriate because: acute on chronic alcohol withdrawal    TOTAL TIME TAKING CARE OF THIS PATIENT: 35 minutes.  >50% time spent on counselling and coordination of care  Note: This dictation was prepared with Dragon dictation along with smaller phrase technology. Any transcriptional errors that result from this process are unintentional.  Enedina Finner M.D    [facility]   CC: Primary care physician; Patient, No Pcp Per

## 2023-02-28 NOTE — Progress Notes (Addendum)
    Patient Name: VERNAL PAGANINI           DOB: 1991/01/14  MRN: [id number]      Admission Date: 02/24/2023  Attending Provider: Calvert Cantor, MD  Primary Diagnosis: Alcohol withdrawal [facility])   Level of care: Telemetry    CROSS COVER NOTE   Date of Service   02/28/2023   [name], 32 y.o. male, was admitted on 02/24/2023 for Alcohol withdrawal (HCC).    HPI/Events of Note   Nursing staff requesting patient be reassessed at bedside due to EtOH withdrawal and behavior risk.  Patient A/O x 4.  No acute distress noted.  No current complaints at this time.  Patient recently receive IV Ativan dose.  He remains calm and cooperative throughout our conversation. Hemodynamically stable, HR 110s.  No changes to level of care at this time.  Nursing staff to continue reassessing patient's CIWA.  Nursing staff requested sitter, as patient has had odd requests such as being given alcohol gel without clearly identifying if he plans to consume alcohol gel.    Additionally, patient randomly requested Suboxone.  Patient states that a friend of his gives him a small dose from time to time to help him relax.  Patient was educated regarding his current treatment plan and was clearly informed that Suboxone would not be ordered.  Interventions/ Plan   X        Anthoney Harada, DNP, ACNPC- AG [facility] [place]

## 2023-03-01 LAB — CBC
HCT: 43.2 % (ref 39.0–52.0)
Hemoglobin: 14.8 g/dL (ref 13.0–17.0)
MCH: 33.5 pg (ref 26.0–34.0)
MCHC: 34.3 g/dL (ref 30.0–36.0)
MCV: 97.7 fL (ref 80.0–100.0)
Platelets: 215 10*3/uL (ref 150–400)
RBC: 4.42 MIL/uL (ref 4.22–5.81)
RDW: 12.4 % (ref 11.5–15.5)
WBC: 8.5 10*3/uL (ref 4.0–10.5)
nRBC: 0 % (ref 0.0–0.2)

## 2023-03-01 LAB — COMPREHENSIVE METABOLIC PANEL
ALT: 40 U/L (ref 0–44)
AST: 34 U/L (ref 15–41)
Albumin: 3.6 g/dL (ref 3.5–5.0)
Alkaline Phosphatase: 114 U/L (ref 38–126)
Anion gap: 10 (ref 5–15)
BUN: 9 mg/dL (ref 6–20)
CO2: 23 mmol/L (ref 22–32)
Calcium: 8.9 mg/dL (ref 8.9–10.3)
Chloride: 101 mmol/L (ref 98–111)
Creatinine, Ser: 0.78 mg/dL (ref 0.61–1.24)
GFR, Estimated: >60 mL/min (ref >60)
Glucose, Bld: 109 mg/dL — ABNORMAL HIGH (ref 70–99)
Potassium: 3.7 mmol/L (ref 3.5–5.1)
Sodium: 134 mmol/L — ABNORMAL LOW (ref 135–145)
Total Bilirubin: 0.9 mg/dL (ref 0.3–1.2)
Total Protein: 7.2 g/dL (ref 6.5–8.1)
